# Patient Record
Sex: Female | Born: 1950
Health system: Southern US, Community
[De-identification: ages and names within clinical notes are randomized; demographics above are authoritative.]

## PROBLEM LIST (undated history)

## (undated) DIAGNOSIS — E785 Hyperlipidemia, unspecified: Secondary | ICD-10-CM

## (undated) DIAGNOSIS — I209 Angina pectoris, unspecified: Secondary | ICD-10-CM

## (undated) DIAGNOSIS — M1990 Unspecified osteoarthritis, unspecified site: Secondary | ICD-10-CM

## (undated) DIAGNOSIS — H3320 Serous retinal detachment, unspecified eye: Secondary | ICD-10-CM

## (undated) DIAGNOSIS — R0602 Shortness of breath: Secondary | ICD-10-CM

## (undated) DIAGNOSIS — F1910 Other psychoactive substance abuse, uncomplicated: Secondary | ICD-10-CM

## (undated) DIAGNOSIS — N189 Chronic kidney disease, unspecified: Secondary | ICD-10-CM

## (undated) DIAGNOSIS — K226 Gastro-esophageal laceration-hemorrhage syndrome: Secondary | ICD-10-CM

## (undated) DIAGNOSIS — M62838 Other muscle spasm: Secondary | ICD-10-CM

## (undated) DIAGNOSIS — K5792 Diverticulitis of intestine, part unspecified, without perforation or abscess without bleeding: Secondary | ICD-10-CM

## (undated) DIAGNOSIS — H269 Unspecified cataract: Secondary | ICD-10-CM

## (undated) DIAGNOSIS — I10 Essential (primary) hypertension: Secondary | ICD-10-CM

## (undated) DIAGNOSIS — K625 Hemorrhage of anus and rectum: Secondary | ICD-10-CM

## (undated) HISTORY — DX: Other muscle spasm: M62.838

## (undated) HISTORY — PX: UPPER GASTROINTESTINAL ENDOSCOPY: SHX188

## (undated) HISTORY — PX: OTHER SURGICAL HISTORY: SHX169

## (undated) HISTORY — PX: TUBAL LIGATION: SHX77

## (undated) HISTORY — DX: Unspecified cataract: H26.9

## (undated) HISTORY — PX: APPENDECTOMY: SHX54

## (undated) HISTORY — PX: DIAGNOSTIC LAPAROSCOPY: SUR761

---

## 1999-08-29 ENCOUNTER — Ambulatory Visit (HOSPITAL_COMMUNITY): Admission: RE | Admit: 1999-08-29 | Discharge: 1999-08-29 | Payer: Self-pay | Admitting: Family Medicine

## 1999-08-29 ENCOUNTER — Encounter: Payer: Self-pay | Admitting: Family Medicine

## 2000-04-29 ENCOUNTER — Ambulatory Visit (HOSPITAL_COMMUNITY): Admission: RE | Admit: 2000-04-29 | Discharge: 2000-04-29 | Payer: Self-pay | Admitting: Internal Medicine

## 2000-04-29 ENCOUNTER — Encounter: Payer: Self-pay | Admitting: Internal Medicine

## 2000-05-04 ENCOUNTER — Ambulatory Visit (HOSPITAL_COMMUNITY): Admission: RE | Admit: 2000-05-04 | Discharge: 2000-05-04 | Payer: Self-pay | Admitting: Internal Medicine

## 2000-05-04 ENCOUNTER — Encounter: Payer: Self-pay | Admitting: Internal Medicine

## 2000-06-03 ENCOUNTER — Encounter (INDEPENDENT_AMBULATORY_CARE_PROVIDER_SITE_OTHER): Payer: Self-pay | Admitting: Specialist

## 2000-06-03 ENCOUNTER — Ambulatory Visit (HOSPITAL_COMMUNITY): Admission: RE | Admit: 2000-06-03 | Discharge: 2000-06-04 | Payer: Self-pay | Admitting: Neurosurgery

## 2000-10-15 ENCOUNTER — Ambulatory Visit (HOSPITAL_COMMUNITY): Admission: RE | Admit: 2000-10-15 | Discharge: 2000-10-15 | Payer: Self-pay | Admitting: *Deleted

## 2000-10-19 ENCOUNTER — Ambulatory Visit (HOSPITAL_COMMUNITY): Admission: RE | Admit: 2000-10-19 | Discharge: 2000-10-19 | Payer: Self-pay | Admitting: *Deleted

## 2000-11-06 ENCOUNTER — Other Ambulatory Visit: Admission: RE | Admit: 2000-11-06 | Discharge: 2000-11-06 | Payer: Self-pay | Admitting: Family Medicine

## 2000-12-16 ENCOUNTER — Emergency Department (HOSPITAL_COMMUNITY): Admission: EM | Admit: 2000-12-16 | Discharge: 2000-12-16 | Payer: Self-pay | Admitting: Emergency Medicine

## 2001-01-10 ENCOUNTER — Emergency Department (HOSPITAL_COMMUNITY): Admission: EM | Admit: 2001-01-10 | Discharge: 2001-01-10 | Payer: Self-pay

## 2001-06-23 ENCOUNTER — Emergency Department (HOSPITAL_COMMUNITY): Admission: EM | Admit: 2001-06-23 | Discharge: 2001-06-23 | Payer: Self-pay | Admitting: Emergency Medicine

## 2001-06-23 ENCOUNTER — Encounter: Payer: Self-pay | Admitting: Emergency Medicine

## 2002-04-21 ENCOUNTER — Emergency Department (HOSPITAL_COMMUNITY): Admission: EM | Admit: 2002-04-21 | Discharge: 2002-04-21 | Payer: Self-pay | Admitting: *Deleted

## 2002-04-22 ENCOUNTER — Encounter: Payer: Self-pay | Admitting: Emergency Medicine

## 2002-04-24 ENCOUNTER — Ambulatory Visit (HOSPITAL_COMMUNITY): Admission: RE | Admit: 2002-04-24 | Discharge: 2002-04-25 | Payer: Self-pay | Admitting: *Deleted

## 2002-04-24 ENCOUNTER — Encounter: Payer: Self-pay | Admitting: *Deleted

## 2002-04-24 ENCOUNTER — Encounter: Payer: Self-pay | Admitting: Neurosurgery

## 2003-05-04 ENCOUNTER — Ambulatory Visit (HOSPITAL_COMMUNITY): Admission: RE | Admit: 2003-05-04 | Discharge: 2003-05-04 | Payer: Self-pay | Admitting: Internal Medicine

## 2003-05-04 ENCOUNTER — Encounter: Payer: Self-pay | Admitting: Internal Medicine

## 2003-06-21 ENCOUNTER — Encounter (HOSPITAL_COMMUNITY): Admission: RE | Admit: 2003-06-21 | Discharge: 2003-09-19 | Payer: Self-pay | Admitting: General Surgery

## 2003-06-22 ENCOUNTER — Encounter: Payer: Self-pay | Admitting: General Surgery

## 2003-08-22 ENCOUNTER — Encounter: Payer: Self-pay | Admitting: General Surgery

## 2004-09-22 ENCOUNTER — Emergency Department (HOSPITAL_COMMUNITY): Admission: EM | Admit: 2004-09-22 | Discharge: 2004-09-22 | Payer: Self-pay | Admitting: Emergency Medicine

## 2004-10-14 ENCOUNTER — Emergency Department (HOSPITAL_COMMUNITY): Admission: EM | Admit: 2004-10-14 | Discharge: 2004-10-14 | Payer: Self-pay | Admitting: Emergency Medicine

## 2007-01-01 ENCOUNTER — Ambulatory Visit: Payer: Self-pay | Admitting: Family Medicine

## 2007-01-04 ENCOUNTER — Ambulatory Visit (HOSPITAL_COMMUNITY): Admission: RE | Admit: 2007-01-04 | Discharge: 2007-01-04 | Payer: Self-pay | Admitting: Family Medicine

## 2007-01-04 ENCOUNTER — Ambulatory Visit: Payer: Self-pay | Admitting: Internal Medicine

## 2007-01-04 ENCOUNTER — Ambulatory Visit: Payer: Self-pay | Admitting: *Deleted

## 2007-01-05 ENCOUNTER — Ambulatory Visit: Payer: Self-pay | Admitting: Family Medicine

## 2007-02-05 ENCOUNTER — Ambulatory Visit: Payer: Self-pay | Admitting: Family Medicine

## 2007-02-19 ENCOUNTER — Ambulatory Visit (HOSPITAL_COMMUNITY): Admission: RE | Admit: 2007-02-19 | Discharge: 2007-02-19 | Payer: Self-pay | Admitting: Family Medicine

## 2007-02-27 ENCOUNTER — Inpatient Hospital Stay (HOSPITAL_COMMUNITY): Admission: EM | Admit: 2007-02-27 | Discharge: 2007-03-02 | Payer: Self-pay | Admitting: Emergency Medicine

## 2007-09-15 ENCOUNTER — Encounter (INDEPENDENT_AMBULATORY_CARE_PROVIDER_SITE_OTHER): Payer: Self-pay | Admitting: *Deleted

## 2008-03-08 ENCOUNTER — Ambulatory Visit: Payer: Self-pay | Admitting: Internal Medicine

## 2008-03-08 ENCOUNTER — Encounter (INDEPENDENT_AMBULATORY_CARE_PROVIDER_SITE_OTHER): Payer: Self-pay | Admitting: Family Medicine

## 2008-03-08 LAB — CONVERTED CEMR LAB
AST: 17 units/L (ref 0–37)
Albumin: 4.5 g/dL (ref 3.5–5.2)
Chloride: 106 meq/L (ref 96–112)
Creatinine, Ser: 1.25 mg/dL — ABNORMAL HIGH (ref 0.40–1.20)
Eosinophils Relative: 2 % (ref 0–5)
Glucose, Bld: 95 mg/dL (ref 70–99)
HCT: 48.9 % — ABNORMAL HIGH (ref 36.0–46.0)
Hemoglobin: 15.2 g/dL — ABNORMAL HIGH (ref 12.0–15.0)
LDL Cholesterol: 104 mg/dL — ABNORMAL HIGH (ref 0–99)
Lymphocytes Relative: 31 % (ref 12–46)
Lymphs Abs: 3.2 10*3/uL (ref 0.7–4.0)
Monocytes Absolute: 0.4 10*3/uL (ref 0.1–1.0)
Monocytes Relative: 4 % (ref 3–12)
Sodium: 146 meq/L — ABNORMAL HIGH (ref 135–145)
Total Protein: 7.4 g/dL (ref 6.0–8.3)
Triglycerides: 129 mg/dL (ref <150)
WBC: 10.2 10*3/uL (ref 4.0–10.5)

## 2008-03-27 ENCOUNTER — Ambulatory Visit: Payer: Self-pay | Admitting: Internal Medicine

## 2008-03-27 ENCOUNTER — Encounter (INDEPENDENT_AMBULATORY_CARE_PROVIDER_SITE_OTHER): Payer: Self-pay | Admitting: Family Medicine

## 2008-03-31 ENCOUNTER — Ambulatory Visit (HOSPITAL_COMMUNITY): Admission: RE | Admit: 2008-03-31 | Discharge: 2008-03-31 | Payer: Self-pay | Admitting: Family Medicine

## 2008-04-24 ENCOUNTER — Ambulatory Visit: Payer: Self-pay | Admitting: Internal Medicine

## 2008-04-24 ENCOUNTER — Encounter (INDEPENDENT_AMBULATORY_CARE_PROVIDER_SITE_OTHER): Payer: Self-pay | Admitting: Family Medicine

## 2008-04-24 LAB — CONVERTED CEMR LAB
AST: 14 units/L (ref 0–37)
BUN: 24 mg/dL — ABNORMAL HIGH (ref 6–23)
CO2: 22 meq/L (ref 19–32)
Calcium: 8.6 mg/dL (ref 8.4–10.5)
Chloride: 106 meq/L (ref 96–112)
Glucose, Bld: 112 mg/dL — ABNORMAL HIGH (ref 70–99)
Potassium: 4.3 meq/L (ref 3.5–5.3)

## 2008-06-06 ENCOUNTER — Ambulatory Visit: Payer: Self-pay | Admitting: Internal Medicine

## 2008-06-06 ENCOUNTER — Encounter (INDEPENDENT_AMBULATORY_CARE_PROVIDER_SITE_OTHER): Payer: Self-pay | Admitting: Family Medicine

## 2008-06-06 LAB — CONVERTED CEMR LAB
ALT: 14 units/L (ref 0–35)
AST: 14 units/L (ref 0–37)
Albumin: 4.6 g/dL (ref 3.5–5.2)
Alkaline Phosphatase: 107 units/L (ref 39–117)
CO2: 23 meq/L (ref 19–32)
Glucose, Bld: 74 mg/dL (ref 70–99)
Total Protein: 7.3 g/dL (ref 6.0–8.3)

## 2008-10-25 ENCOUNTER — Encounter (INDEPENDENT_AMBULATORY_CARE_PROVIDER_SITE_OTHER): Payer: Self-pay | Admitting: Family Medicine

## 2008-10-25 ENCOUNTER — Ambulatory Visit: Payer: Self-pay | Admitting: Internal Medicine

## 2008-10-25 LAB — CONVERTED CEMR LAB
AST: 16 units/L (ref 0–37)
Alkaline Phosphatase: 120 units/L — ABNORMAL HIGH (ref 39–117)
Basophils Absolute: 0 10*3/uL (ref 0.0–0.1)
Basophils Relative: 0 % (ref 0–1)
CO2: 22 meq/L (ref 19–32)
Calcium: 9 mg/dL (ref 8.4–10.5)
Chloride: 109 meq/L (ref 96–112)
Creatinine, Ser: 1.34 mg/dL — ABNORMAL HIGH (ref 0.40–1.20)
HCT: 47.9 % — ABNORMAL HIGH (ref 36.0–46.0)
Hemoglobin: 15 g/dL (ref 12.0–15.0)
MCHC: 31.3 g/dL (ref 30.0–36.0)
Neutrophils Relative %: 75 % (ref 43–77)
Platelets: 368 10*3/uL (ref 150–400)
Potassium: 4.8 meq/L (ref 3.5–5.3)
RDW: 15.3 % (ref 11.5–15.5)
Sodium: 143 meq/L (ref 135–145)
Total Bilirubin: 0.5 mg/dL (ref 0.3–1.2)

## 2008-11-10 ENCOUNTER — Ambulatory Visit (HOSPITAL_COMMUNITY): Admission: RE | Admit: 2008-11-10 | Discharge: 2008-11-10 | Payer: Self-pay | Admitting: Internal Medicine

## 2009-05-02 ENCOUNTER — Ambulatory Visit (HOSPITAL_COMMUNITY): Admission: RE | Admit: 2009-05-02 | Discharge: 2009-05-02 | Payer: Self-pay | Admitting: Family Medicine

## 2009-05-11 ENCOUNTER — Ambulatory Visit: Payer: Self-pay | Admitting: Internal Medicine

## 2009-06-29 ENCOUNTER — Ambulatory Visit: Payer: Self-pay | Admitting: Internal Medicine

## 2009-06-29 ENCOUNTER — Encounter (INDEPENDENT_AMBULATORY_CARE_PROVIDER_SITE_OTHER): Payer: Self-pay | Admitting: Adult Health

## 2009-12-11 ENCOUNTER — Ambulatory Visit: Payer: Self-pay | Admitting: Internal Medicine

## 2009-12-13 ENCOUNTER — Ambulatory Visit: Payer: Self-pay | Admitting: Internal Medicine

## 2010-02-22 ENCOUNTER — Ambulatory Visit: Payer: Self-pay | Admitting: Internal Medicine

## 2010-02-22 ENCOUNTER — Encounter (INDEPENDENT_AMBULATORY_CARE_PROVIDER_SITE_OTHER): Payer: Self-pay | Admitting: Adult Health

## 2010-02-22 LAB — CONVERTED CEMR LAB
ALT: 8 units/L (ref 0–35)
AST: 12 units/L (ref 0–37)
BUN: 16 mg/dL (ref 6–23)
Basophils Relative: 0 % (ref 0–1)
Chloride: 109 meq/L (ref 96–112)
Creatinine, Ser: 1.33 mg/dL — ABNORMAL HIGH (ref 0.40–1.20)
Eosinophils Absolute: 0.2 10*3/uL (ref 0.0–0.7)
Hemoglobin: 13.8 g/dL (ref 12.0–15.0)
Lymphocytes Relative: 16 % (ref 12–46)
MCV: 84.1 fL (ref 78.0–100.0)
Monocytes Absolute: 0.4 10*3/uL (ref 0.1–1.0)
Neutro Abs: 9.3 10*3/uL — ABNORMAL HIGH (ref 1.7–7.7)
Neutrophils Relative %: 79 % — ABNORMAL HIGH (ref 43–77)
Platelets: 326 10*3/uL (ref 150–400)
Potassium: 4.5 meq/L (ref 3.5–5.3)
RDW: 15.2 % (ref 11.5–15.5)
Sodium: 144 meq/L (ref 135–145)
TSH: 0.686 microintl units/mL (ref 0.350–4.500)
Total CHOL/HDL Ratio: 2.3
Triglycerides: 97 mg/dL (ref <150)
Vit D, 25-Hydroxy: 20 ng/mL — ABNORMAL LOW (ref 30–89)

## 2010-03-05 ENCOUNTER — Encounter: Admission: RE | Admit: 2010-03-05 | Discharge: 2010-03-05 | Payer: Self-pay | Admitting: Internal Medicine

## 2010-03-15 ENCOUNTER — Ambulatory Visit: Payer: Self-pay | Admitting: Internal Medicine

## 2010-03-19 ENCOUNTER — Ambulatory Visit (HOSPITAL_COMMUNITY): Admission: RE | Admit: 2010-03-19 | Discharge: 2010-03-19 | Payer: Self-pay | Admitting: Adult Health

## 2010-03-29 ENCOUNTER — Ambulatory Visit: Payer: Self-pay | Admitting: Internal Medicine

## 2010-05-17 ENCOUNTER — Ambulatory Visit (HOSPITAL_COMMUNITY): Admission: RE | Admit: 2010-05-17 | Discharge: 2010-05-17 | Payer: Self-pay | Admitting: Cardiology

## 2010-06-20 ENCOUNTER — Ambulatory Visit (HOSPITAL_COMMUNITY): Admission: RE | Admit: 2010-06-20 | Discharge: 2010-06-20 | Payer: Self-pay | Admitting: Cardiology

## 2010-09-09 ENCOUNTER — Ambulatory Visit: Payer: Self-pay | Admitting: Internal Medicine

## 2011-05-16 NOTE — H&P (Signed)
NAME:  Jennifer Moss, Jennifer Moss                ACCOUNT NO.:  [id number]   MEDICAL RECORD NO.:  [id number]          PATIENT TYPE:  INP   LOCATION:  0102                         FACILITY:  [facility]   PHYSICIAN:  Hind I Elsaid, MD      DATE OF BIRTH:  06-17-1951   DATE OF ADMISSION:  02/27/2007  DATE OF DISCHARGE:                              HISTORY & PHYSICAL   PRIMARY CARE PHYSICIAN:  Unassigned.  Patient has no primary care  physician.  She had a Careers adviser, whose name is Sherilyn Cooter.   CHIEF COMPLAINT:  Severe low back pain.   HISTORY OF PRESENT ILLNESS:  A 60 year old African-American female with  a history of neurofibromatosis, type I, according to the record, but the  patient denies any history like that with a history of left sciatic  nerve sheath tumor, is status post excision of the nerve sheath tumor in  2001.  Presented with a chief complaint of lower back pain.  Patient was  in good health all day today.  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  Patient then suddenly  developed severe back pain radiating to both lower extremities.  The  pain was a 10/10.  It is sharp pain.  Not associated with numbness or  weakness.  The pain off and on, then suddenly, the pain becomes worse in  severity.  The patient is not even able to walk.  Patient denies any  numbness or weakness in her lower extremities.  Denies any incontinence  of urine or bowel.  Denies any other sign of pain.  Denies any abdominal  pain.  Denies any chest pain.  The patient, when presented to the  emergency room, the pain was so severe, but she was able to move her  extremities.  She received some pain medication with some improvement on  the pain, but the pain continued to be in the lumbosacral area.  The  patient denies any headache.  Denies any facial weakness or drooping of  saliva.  The patient denies any similar history like that in the past.   PAST MEDICAL HISTORY:  1.  Neurofibromatosis, type 1.  2. Sciatic nerve tumor, status post excision in 2001, which showed      neurofibromatosis.   PAST SURGICAL HISTORY:  1. As I mentioned, a sciatic nerve tumor, status post excision on the      left side.  2. Some abdominal surgery when she was a child.   ALLERGIES:  No known drug allergies.   MEDICATIONS:  Not known. She is not on any medication.   FAMILY HISTORY:  Positive for heart attack.  Her father died with heart  attack.  Her mother is still alive.  She had emphysema and seizure  disorder.   REVIEW OF SYSTEMS:  Denies any neck stiffness.  Denies any abdominal  pain.  Positive for nausea.  Positive for two episodes of vomiting,  mainly food particles.  Mainly due to the pain.  No burning micturition.  No change in bowel habits.  No numbness or weakness, as I mentioned, of  the extremities.  The patient seen in the emergency room.   A laboratory workup was done, CAT scan of the lumbar spine, mild  degenerative change in the lower lumbar spine.  Does not show any  __________.   PHYSICAL EXAMINATION:  VITAL SIGNS:  Temperature 98.1, blood pressure  151/73, heart rate 84, respiratory rate 22.  Oxygen saturation 98% on  room air.  GENERAL:  A pleasant African-American female in mild distress because of  pain.  HEENT:  Normocephalic and atraumatic.  Pupils are equal, round and  reactive to light and accommodation.  There is some cafe au lait spots  on her face.  There is some nevi all over her face.  NECK:  No JVD.  No thyromegaly.  CHEST:  Normal fascicular breathing with equal air entry and symmetrical  breast examination.  No lymphadenopathy.  No obvious masses.  HEART:  S1 and S2 with no added sounds.  ABDOMEN:  Soft and nontender.  Bowel sounds positive.  EXTREMITIES:  Examination of the lower extremities, no lower limb edema,  and no peripheral pulses.  CNS:  Cranial nerves II-XII are intact.  Patient can move all  extremities, 5/5 on the  lower extremities, pronator and supinator within  normal limits.  Babinski's sign within normal limits.  RECTAL:  Rectal tone within normal.  Sensation was intact.  BACK:  Examination of the back, there is tenderness on palpation, at the  lumbosacral area, mainly at the left and right pelvic area.   ASSESSMENT/PLAN:  1. Severe lower back pain without neurological deficit.  Patient has a      history of neurofibromatosis.  Differential diagnosis could be a      recurrence of sheath tumor or it could be herniated disk, although      unlikely because of both lower extremities are involved.  There is      no neurologic finding at this time.  With all other MRI of the      lumbosacral spine with the pelvis, will get x-ray of the      lumbosacral spine with the pelvis.  Will continue with pain      medication at this time.  Will consider calling the patient's      surgeon if there is any recurrence of the tumor.  No need for      starting on a steroid at this time, as there is no evidence of any      neurological finding to include spinal cord compression on the      differential diagnosis.  2. Vomiting secondary to #1:  Supportive measures.  Abdomen is soft.      No evidence of any acute abdomen.  3. Deep venous thrombosis/gastrointestinal prophylaxis.      Hind Bosie Helper, MD  Electronically Signed     HIE/MEDQ  D:  02/27/2007  T:  02/27/2007  Job:  [id number]

## 2011-05-16 NOTE — Discharge Summary (Signed)
NAMEWILLOW, [name]:  [id number]   MEDICAL RECORD NO.:  [id number]          PATIENT TYPE:  INP   LOCATION:  1606                         FACILITY:  [facility]   PHYSICIAN:  Hettie Holstein, D.O.    DATE OF BIRTH:  1951/02/02   DATE OF PROCEDURE:  DATE OF DISCHARGE:  03/02/2007                    STAT - MUST CHANGE TO CORRECT WORK TYPE   ADDENDUM  Addendum to account [id number]   I contacted [facility], spoke with Fannie Knee Drinker's nurse, Mora Bellman, of the  need to contact [name] for followup of these MRI results.      Hettie Holstein, D.O.  Electronically Signed     ESS/MEDQ  D:  04/08/2007  T:  04/08/2007  Job:  [id number]

## 2011-05-16 NOTE — Op Note (Signed)
[place]. [facility]  Patient:    Jennifer Moss, Jennifer Moss                       MRN: [id number] Proc. Date: 06/03/00 Adm. Date:  [id number] Disc. Date: [id number] Attending:  Donn Pierini                           Operative Report  ATTENDING PHYSICIAN:  Julio Sicks, M.D.  SERVICE:  Neurosurgery.  PREOPERATIVE DIAGNOSIS:  Left, sciatic nerve sheath tumor.  POSTOPERATIVE DIAGNOSIS:  Left, sciatic nerve sheath tumor.  PROCEDURE:  Exploration of the left, sciatic nerve with excision of nerve sheath tumor utilizing microdissection.  SURGEON:  Julio Sicks, M.D.  ASSISTANT:  Donzetta Sprung. Roney Jaffe., M.D.  ANESTHESIA:  General endotracheal.  INDICATIONS:  [name] is a 60 year old, black female with a history of neurofibromatosis type I who presents with a four month history of progressive left hip and left lower extremity pain.  The patient reports that whenever she sits down, she has pain shooting out her left lower extremity.  Imaging studies have revealed a left-sided, sciatic nerve sheath tumor.  This is consistent with a neurofibroma.  The patient has been counseled as to her options.  She has decided to proceed with excision of the nerve sheath tumor. She realizes that the risks of the surgery include new weakness or sensory loss as a result of the excision of the lesion.  DESCRIPTION OF PROCEDURE:  The patient was taken to the operating room and placed on the operating table in the supine position.  After an adequate level of anesthesia was achieved, the patient was turned prone onto ______ and appropriately padded.  The patients left gluteal and posterior thigh regions were then prepped and draped.  A #10 blade was used to make a curvilinear incision over the left buttocks and extending into the proximal posterior, left thigh.  This was carried down sharply through the superficial fascia and fat to the gluteus maximus fascia.  The gluteus maximus was then  divided along the muscle fibers and then resected superiorly to distally.  The sciatic trunk was then dissected free.  The hamstring muscles were mobilized and retracted towards the left.  The nerve sheath tumor was readily apparent.  The microscope was brought into the field and used for microdissection of the nerve sheath tumor and the sciatic nerve.  Using microdissection, the nerve sheath tumor was slowly and gradually peeled away from the normal fibrils of the sciatic nerve.  The nerve sheath tumor was then completely isolated and dissected free using microscissors.  The specimen was sent to pathology. Prior to sectioning the tumor, the nerve fibers leading into the tumor were stimulated.  There was found to be no evidence of active muscular contraction. The wound was then copiously irrigated with antibiotic solution.  The sciatic nerve was completely within continuity.  The wound was then closed in layers of Vicryl sutures.  Steri-Strips were applied to the surface.  There were no apparent complications.  The patient tolerated the procedure well, and she returned to the recovery room postoperatively. DD:  06/03/00 TD:  06/07/00 Job: [id number]

## 2011-05-16 NOTE — Discharge Summary (Signed)
NAMESHAELIN, Jennifer [name]:  [id number]   MEDICAL RECORD NO.:  [id number]          PATIENT TYPE:  INP   LOCATION:  1606                         FACILITY:  [facility]   PHYSICIAN:  Hettie Holstein, D.O.    DATE OF BIRTH:  06/12/1951   DATE OF ADMISSION:  02/27/2007  DATE OF DISCHARGE:  03/02/2007                               DISCHARGE SUMMARY   PRIMARY CARE PHYSICIAN:  [facility].   FINAL DIAGNOSES:  1. Low back pain with a history of neurofibromatosis and history of      left sciatic nerve sheath tumor followed by Dr. Julio Sicks and      diabetes.  Status post inpatient evaluation including an MRI of her      L-spine which revealed mild degenerative changes without      significant spondylostenosis or nerve root compression.  2. Decreased signal intensity of bone marrow.  3. 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  4. Subcentimeter liver lesions noted but incompletely evaluated.   These findings are noted on the full radiographic report. Post discharge  I will cc a copy of this to [facility] so that these can be followed  up upon.   MEDICATIONS ON DISCHARGE:  1. Motrin 600 mg t.i.d.  2. OxyContin 5 mg 1-2 every 6h. as needed for pain.  3. Robaxin 1 gram q.i.d. dispense #20.  Refill times 1.  4. Milk of Magnesia 30 mL 1-2 times per day with recommendations to      hold for diarrhea.   She is instructed to remain off of work for 1-2 weeks, depending on  progress, and continue with outpatient physical therapy.   DISPOSITION:  Copy sent to [facility] so that Jennifer Moss can be  contacted to address and follow up with the imaging findings as  described above with reference to her liver lesions that were  incompletely evaluated.   HISTORY OF PRESENTING ILLNESS:  For full details, please refer to the  H&P as dictated by Dr. Ebony Cargo.  However, briefly, Jennifer Moss is a 60-  year-old female with a history of  neurofibromatosis type 1 and a history  of nerve sheath tumor noted in 2001.  She presented with a chief  complaint of low back pain.  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  She suddenly  developed severe back pain radiating to the lower extremities.  This was  not associated with numbness or weakness.  At any event, she presented  to the emergency room being in severe pain.  She denied any other  neurologic symptoms.   HOSPITAL COURSE:  She was admitted for pain management and symptomatic  management.  She did have some nausea and vomiting.  These subsequently  did resolve.  She underwent MRI as described above.  The preliminary  reports revealed no acute nerve impingement findings.  There were some  incidental findings that should be followed up by the patient's primary  physician.  I will cc a copy of this to [facility] and contact them as  well to assure there is ample followup of these lesions.   Her laboratory data revealed her sodium to be 142, potassium 3.7, BUN  13, creatinine 1.36 and glucose of 96.  WBC of 11.3, hemoglobin 14,  platelet count of 339.      Hettie Holstein, D.O.  Electronically Signed     ESS/MEDQ  D:  04/08/2007  T:  04/08/2007  Job:  [id number]   cc:   [facility]

## 2011-05-23 ENCOUNTER — Emergency Department (HOSPITAL_COMMUNITY)
Admission: EM | Admit: 2011-05-23 | Discharge: 2011-05-23 | Disposition: A | Discharge status: Home / Self Care | Payer: Self-pay | Attending: Emergency Medicine | Admitting: Emergency Medicine

## 2011-05-23 DIAGNOSIS — K089 Disorder of teeth and supporting structures, unspecified: Secondary | ICD-10-CM | POA: Insufficient documentation

## 2011-05-23 DIAGNOSIS — K029 Dental caries, unspecified: Secondary | ICD-10-CM | POA: Insufficient documentation

## 2011-05-23 DIAGNOSIS — I10 Essential (primary) hypertension: Secondary | ICD-10-CM | POA: Insufficient documentation

## 2011-12-30 HISTORY — PX: COLONOSCOPY: SHX174

## 2012-07-09 ENCOUNTER — Other Ambulatory Visit (HOSPITAL_COMMUNITY): Payer: Self-pay | Admitting: Family Medicine

## 2012-07-09 DIAGNOSIS — Z1231 Encounter for screening mammogram for malignant neoplasm of breast: Secondary | ICD-10-CM

## 2012-07-29 ENCOUNTER — Ambulatory Visit (HOSPITAL_COMMUNITY)
Admission: RE | Admit: 2012-07-29 | Discharge: 2012-07-29 | Disposition: A | Discharge status: Home / Self Care | Payer: Self-pay | Source: Ambulatory Visit | Attending: Family Medicine | Admitting: Family Medicine

## 2012-07-29 DIAGNOSIS — Z1231 Encounter for screening mammogram for malignant neoplasm of breast: Secondary | ICD-10-CM | POA: Insufficient documentation

## 2012-11-05 ENCOUNTER — Emergency Department (HOSPITAL_COMMUNITY): Payer: Self-pay

## 2012-11-05 ENCOUNTER — Emergency Department (HOSPITAL_COMMUNITY)
Admission: EM | Admit: 2012-11-05 | Discharge: 2012-11-05 | Disposition: A | Discharge status: Home / Self Care | Payer: Self-pay | Attending: Emergency Medicine | Admitting: Emergency Medicine

## 2012-11-05 ENCOUNTER — Encounter (HOSPITAL_COMMUNITY): Payer: Self-pay | Admitting: Emergency Medicine

## 2012-11-05 DIAGNOSIS — M940 Chondrocostal junction syndrome [Tietze]: Secondary | ICD-10-CM | POA: Insufficient documentation

## 2012-11-05 DIAGNOSIS — I10 Essential (primary) hypertension: Secondary | ICD-10-CM | POA: Insufficient documentation

## 2012-11-05 HISTORY — DX: Essential (primary) hypertension: I10

## 2012-11-05 LAB — CBC WITH DIFFERENTIAL/PLATELET
Basophils Absolute: 0 10*3/uL (ref 0.0–0.1)
Basophils Relative: 0 % (ref 0–1)
Eosinophils Relative: 1 % (ref 0–5)
HCT: 43.7 % (ref 36.0–46.0)
MCHC: 33.9 g/dL (ref 30.0–36.0)
Monocytes Absolute: 0.4 10*3/uL (ref 0.1–1.0)
Neutro Abs: 5.3 10*3/uL (ref 1.7–7.7)
Platelets: 344 10*3/uL (ref 150–400)
RDW: 13.9 % (ref 11.5–15.5)

## 2012-11-05 LAB — COMPREHENSIVE METABOLIC PANEL
AST: 24 U/L (ref 0–37)
Albumin: 3.9 g/dL (ref 3.5–5.2)
Calcium: 9.6 mg/dL (ref 8.4–10.5)
Chloride: 101 mEq/L (ref 96–112)
Creatinine, Ser: 1.53 mg/dL — ABNORMAL HIGH (ref 0.50–1.10)

## 2012-11-05 LAB — RAPID URINE DRUG SCREEN, HOSP PERFORMED
Amphetamines: NOT DETECTED
Cocaine: POSITIVE — AB
Opiates: NOT DETECTED
Tetrahydrocannabinol: POSITIVE — AB

## 2012-11-05 MED ORDER — ASPIRIN 81 MG PO CHEW
324.0000 mg | CHEWABLE_TABLET | Freq: Once | ORAL | Status: AC
  Administered 2012-11-05: 324 mg via ORAL
  Filled 2012-11-05: qty 4

## 2012-11-05 MED ORDER — KETOROLAC TROMETHAMINE 60 MG/2ML IM SOLN: 30.0000 mg | Freq: Once | INTRAMUSCULAR | Status: DC

## 2012-11-05 MED ORDER — LORAZEPAM 1 MG PO TABS
1.0000 mg | ORAL_TABLET | Freq: Once | ORAL | Status: AC
  Administered 2012-11-05: 1 mg via ORAL
  Filled 2012-11-05: qty 1

## 2012-11-05 MED ORDER — IBUPROFEN 200 MG PO TABS: 600.0000 mg | ORAL_TABLET | Freq: Four times a day (QID) | ORAL | Status: DC | PRN

## 2012-11-05 MED ORDER — KETOROLAC TROMETHAMINE 30 MG/ML IJ SOLN
30.0000 mg | Freq: Once | INTRAMUSCULAR | Status: AC
  Administered 2012-11-05: 30 mg via INTRAVENOUS
  Filled 2012-11-05: qty 1

## 2012-11-05 NOTE — ED Notes (Signed)
Came into Patient room earlier, Patient was out of room in Xray

## 2012-11-05 NOTE — ED Provider Notes (Signed)
History     CSN: [id number]  Arrival date & time 11/05/12  4098   First MD Initiated Contact with Patient 11/05/12 [phone]      Chief Complaint  Patient presents with  . Chest Pain    (Consider location/radiation/quality/duration/timing/severity/associated sxs/prior treatment) HPI Patient complains of dull, "sore", 6/10 left-sided chest pain that radiates to the L scapula and L arm. The pain began 36 hrs prior to presentation while at rest. She states that the pain has been constant since that time. She denies any SOB, diaphoresis, palpitations. Denies any alleviating or exacerbating factors such as change in position or change in pain with respirations. She states she has taken only one tylenol for the pain, which did not seem to help. Denies similar pain in the past. 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  Past Medical History  Diagnosis Date  . Hypertension     History reviewed. No pertinent past surgical history.  History reviewed. No pertinent family history.  History  Substance Use Topics  . Smoking status: Never Smoker   . Smokeless tobacco: Not on file  . Alcohol Use: Yes     Comment: occ    OB History    Grav Para Term Preterm Abortions TAB SAB Ect Mult Living                  Review of Systems  Constitutional: Negative for fever, chills and diaphoresis.  Respiratory: Negative for cough, chest tightness and shortness of breath.   Cardiovascular: Positive for chest pain. Negative for palpitations and leg swelling.  Gastrointestinal: Negative for nausea, vomiting, abdominal pain and diarrhea.  Genitourinary: Negative.   Musculoskeletal: Negative.   Skin: Negative.   Neurological: Negative for light-headedness.  Hematological: Negative.   Psychiatric/Behavioral: Negative.     Allergies  Review of patient's allergies indicates no known allergies.  Home Medications  No current outpatient prescriptions on  file.  BP 120/90  Pulse 64  Temp 97.8 F (36.6 C) (Oral)  Resp 20  SpO2 100%  Physical Exam  Constitutional: She is oriented to person, place, and time. She appears well-developed and well-nourished. No distress.  HENT:  Head: Normocephalic and atraumatic.  Eyes: Conjunctivae normal are normal. Pupils are equal, round, and reactive to light. No scleral icterus.  Neck: Normal range of motion.  Cardiovascular: Normal rate and regular rhythm.   No murmur heard. Pulmonary/Chest: Effort normal. She has no wheezes. She has no rales. She exhibits tenderness (tender to palpation of L chest, reproducing identical pain to that of chief complaint).  Abdominal: Soft. She exhibits no distension. There is no tenderness.  Musculoskeletal: Normal range of motion. She exhibits no edema.  Neurological: She is alert and oriented to person, place, and time. No cranial nerve deficit.  Skin: Skin is warm and dry. No rash noted.  Psychiatric: She has a normal mood and affect. Her behavior is normal.    ED Course  Procedures (including critical care time)   Labs Reviewed  CBC WITH DIFFERENTIAL  COMPREHENSIVE METABOLIC PANEL  TROPONIN I   No results found.   1. Costochondritis       MDM   Date: 11/05/2012  Rate: 66  Rhythm: normal sinus rhythm  QRS Axis: normal  Intervals: normal  ST/T Wave abnormalities: normal  Conduction Disutrbances:none  Narrative Interpretation:   Old EKG Reviewed: unchanged  Patient given toradol, states this relieved the pain somewhat. Will be discharged on ibuprofen for costochondritis.  Elfredia Nevins, MD 11/05/12 [phone]

## 2012-11-05 NOTE — ED Provider Notes (Signed)
I saw and evaluated the patient, reviewed the resident's note and I agree with the findings and plan.  Reproducible L sided chest pain that has been constant x 2 days.  No SOB, nausea, palpitations. EKG nonischemic, troponin negative, cocaine positive.  Delta troponin negative. Pain reproducible.  Glynn Octave, MD 11/05/12 [phone]

## 2012-11-05 NOTE — ED Notes (Signed)
Pt c/o midsternal to left sided CP with radiation down both arms and in ribs x 3 days

## 2012-12-15 ENCOUNTER — Encounter (HOSPITAL_COMMUNITY): Payer: Self-pay | Admitting: Physical Medicine and Rehabilitation

## 2012-12-15 ENCOUNTER — Emergency Department (HOSPITAL_COMMUNITY): Payer: Self-pay

## 2012-12-15 ENCOUNTER — Inpatient Hospital Stay (HOSPITAL_COMMUNITY)
Admission: EM | Admit: 2012-12-15 | Discharge: 2012-12-22 | DRG: 379 | Disposition: A | Discharge status: Home / Self Care | Payer: MEDICAID | Attending: Family Medicine | Admitting: Family Medicine

## 2012-12-15 DIAGNOSIS — F1910 Other psychoactive substance abuse, uncomplicated: Secondary | ICD-10-CM | POA: Diagnosis present

## 2012-12-15 DIAGNOSIS — E785 Hyperlipidemia, unspecified: Secondary | ICD-10-CM | POA: Diagnosis present

## 2012-12-15 DIAGNOSIS — I10 Essential (primary) hypertension: Secondary | ICD-10-CM

## 2012-12-15 DIAGNOSIS — F17200 Nicotine dependence, unspecified, uncomplicated: Secondary | ICD-10-CM | POA: Diagnosis present

## 2012-12-15 DIAGNOSIS — F1210 Cannabis abuse, uncomplicated: Secondary | ICD-10-CM | POA: Diagnosis present

## 2012-12-15 DIAGNOSIS — I129 Hypertensive chronic kidney disease with stage 1 through stage 4 chronic kidney disease, or unspecified chronic kidney disease: Secondary | ICD-10-CM | POA: Diagnosis present

## 2012-12-15 DIAGNOSIS — K5733 Diverticulitis of large intestine without perforation or abscess with bleeding: Principal | ICD-10-CM | POA: Diagnosis present

## 2012-12-15 DIAGNOSIS — K5732 Diverticulitis of large intestine without perforation or abscess without bleeding: Secondary | ICD-10-CM

## 2012-12-15 DIAGNOSIS — Q631 Lobulated, fused and horseshoe kidney: Secondary | ICD-10-CM

## 2012-12-15 DIAGNOSIS — K625 Hemorrhage of anus and rectum: Secondary | ICD-10-CM | POA: Diagnosis present

## 2012-12-15 DIAGNOSIS — K5792 Diverticulitis of intestine, part unspecified, without perforation or abscess without bleeding: Secondary | ICD-10-CM | POA: Diagnosis present

## 2012-12-15 DIAGNOSIS — N183 Chronic kidney disease, stage 3 unspecified: Secondary | ICD-10-CM | POA: Diagnosis present

## 2012-12-15 DIAGNOSIS — K226 Gastro-esophageal laceration-hemorrhage syndrome: Secondary | ICD-10-CM | POA: Diagnosis present

## 2012-12-15 DIAGNOSIS — Q638 Other specified congenital malformations of kidney: Secondary | ICD-10-CM

## 2012-12-15 DIAGNOSIS — F1410 Cocaine abuse, uncomplicated: Secondary | ICD-10-CM | POA: Diagnosis present

## 2012-12-15 HISTORY — DX: Serous retinal detachment, unspecified eye: H33.20

## 2012-12-15 HISTORY — DX: Hyperlipidemia, unspecified: E78.5

## 2012-12-15 HISTORY — DX: Chronic kidney disease, unspecified: N18.9

## 2012-12-15 HISTORY — DX: Shortness of breath: R06.02

## 2012-12-15 HISTORY — DX: Angina pectoris, unspecified: I20.9

## 2012-12-15 HISTORY — DX: Unspecified osteoarthritis, unspecified site: M19.90

## 2012-12-15 LAB — URINE MICROSCOPIC-ADD ON

## 2012-12-15 LAB — URINALYSIS, ROUTINE W REFLEX MICROSCOPIC
Glucose, UA: NEGATIVE mg/dL
Ketones, ur: 15 mg/dL — AB
Protein, ur: 100 mg/dL — AB

## 2012-12-15 LAB — TYPE AND SCREEN: Antibody Screen: NEGATIVE

## 2012-12-15 LAB — COMPREHENSIVE METABOLIC PANEL
ALT: 11 U/L (ref 0–35)
AST: 18 U/L (ref 0–37)
Alkaline Phosphatase: 153 U/L — ABNORMAL HIGH (ref 39–117)
CO2: 24 mEq/L (ref 19–32)
Calcium: 10.2 mg/dL (ref 8.4–10.5)
GFR calc non Af Amer: 36 mL/min — ABNORMAL LOW (ref >90)
Potassium: 3.9 mEq/L (ref 3.5–5.1)
Sodium: 141 mEq/L (ref 135–145)
Total Protein: 8 g/dL (ref 6.0–8.3)

## 2012-12-15 LAB — CBC WITH DIFFERENTIAL/PLATELET
Basophils Relative: 0 % (ref 0–1)
HCT: 50.6 % — ABNORMAL HIGH (ref 36.0–46.0)
MCHC: 33.8 g/dL (ref 30.0–36.0)
MCV: 81.9 fL (ref 78.0–100.0)
Monocytes Absolute: 0.5 10*3/uL (ref 0.1–1.0)
Monocytes Relative: 2 % — ABNORMAL LOW (ref 3–12)
RDW: 13.4 % (ref 11.5–15.5)

## 2012-12-15 MED ORDER — HYDROMORPHONE HCL PF 1 MG/ML IJ SOLN
1.0000 mg | Freq: Once | INTRAMUSCULAR | Status: AC
  Administered 2012-12-15: 1 mg via INTRAVENOUS
  Filled 2012-12-15: qty 1

## 2012-12-15 MED ORDER — IOHEXOL 300 MG/ML  SOLN
100.0000 mL | Freq: Once | INTRAMUSCULAR | Status: AC | PRN
  Administered 2012-12-15: 100 mL via INTRAVENOUS

## 2012-12-15 MED ORDER — ONDANSETRON HCL 4 MG/2ML IJ SOLN
4.0000 mg | Freq: Once | INTRAMUSCULAR | Status: AC
  Administered 2012-12-15: 4 mg via INTRAVENOUS
  Filled 2012-12-15: qty 2

## 2012-12-15 MED ORDER — SODIUM CHLORIDE 0.9 % IV BOLUS (SEPSIS)
1000.0000 mL | Freq: Once | INTRAVENOUS | Status: AC
  Administered 2012-12-15: 1000 mL via INTRAVENOUS

## 2012-12-15 MED ORDER — IOHEXOL 300 MG/ML  SOLN
20.0000 mL | INTRAMUSCULAR | Status: AC
  Administered 2012-12-15: 20 mL via ORAL

## 2012-12-15 NOTE — ED Provider Notes (Signed)
History     CSN: [id number]  Arrival date & time 12/15/12  1554   First MD Initiated Contact with Patient 12/15/12 1650      Chief Complaint  Patient presents with  . Rectal Bleeding    (Consider location/radiation/quality/duration/timing/severity/associated sxs/prior treatment) HPI Pt reports about 10am this morning she began having multiple episodes of vomiting and severe diffuse cramping abdominal pain. She reports several episodes of bright red blood per rectum since that time, no associated fever. She has not had any particular provoking or relieving factors. No sick contacts.   Past Medical History  Diagnosis Date  . Hypertension   . Hyperlipemia     No past surgical history on file.  No family history on file.  History  Substance Use Topics  . Smoking status: Never Smoker   . Smokeless tobacco: Not on file  . Alcohol Use: Yes     Comment: occ    OB History    Grav Para Term Preterm Abortions TAB SAB Ect Mult Living                  Review of Systems All other systems reviewed and are negative except as noted in HPI.   Allergies  Review of patient's allergies indicates no known allergies.  Home Medications   Current Outpatient Rx  Name  Route  Sig  Dispense  Refill  . AMLODIPINE BESYLATE 10 MG PO TABS   Oral   Take 10 mg by mouth daily.         Marland Kitchen VITAMIN D3 1000 UNITS PO TABS   Oral   Take 1,000 Units by mouth daily.         Marland Kitchen CLONIDINE HCL 0.2 MG PO TABS   Oral   Take 0.2 mg by mouth 2 (two) times daily.         . IBUPROFEN 200 MG PO TABS   Oral   Take 3 tablets (600 mg total) by mouth every 6 (six) hours as needed for pain.   30 tablet   0   . LISINOPRIL 20 MG PO TABS   Oral   Take 20 mg by mouth daily.         . OMEGA-3-ACID ETHYL ESTERS 1 G PO CAPS   Oral   Take 1 g by mouth 2 (two) times daily.           BP 134/94  Pulse 97  Temp 98.8 F (37.1 C) (Oral)  SpO2 99%  Physical Exam  Nursing note and vitals  reviewed. Constitutional: She is oriented to person, place, and time. She appears well-developed and well-nourished.  HENT:  Head: Normocephalic and atraumatic.  Eyes: EOM are normal. Pupils are equal, round, and reactive to light.  Neck: Normal range of motion. Neck supple.  Cardiovascular: Normal rate, normal heart sounds and intact distal pulses.   Pulmonary/Chest: Effort normal and breath sounds normal.  Abdominal: Bowel sounds are normal. She exhibits no distension. There is tenderness (diffuse tenderness). There is no rebound and no guarding.  Genitourinary:       Bright red blood on rectal exam, no external hemorrhoids  Musculoskeletal: Normal range of motion. She exhibits no edema and no tenderness.  Neurological: She is alert and oriented to person, place, and time. She has normal strength. No cranial nerve deficit or sensory deficit.  Skin: Skin is warm and dry. No rash noted.  Psychiatric: She has a normal mood and affect.    ED Course  Procedures (including critical care time)  Labs Reviewed  CBC WITH DIFFERENTIAL - Abnormal; Notable for the following:    WBC 25.8 (*)     RBC 6.18 (*)     Hemoglobin 17.1 (*)     HCT 50.6 (*)     Neutrophils Relative 96 (*)     Lymphocytes Relative 2 (*)     Monocytes Relative 2 (*)     Neutro Abs 24.8 (*)     Lymphs Abs 0.5 (*)     All other components within normal limits  COMPREHENSIVE METABOLIC PANEL - Abnormal; Notable for the following:    Glucose, Bld 161 (*)     Creatinine, Ser 1.55 (*)     Alkaline Phosphatase 153 (*)     GFR calc non Af Amer 36 (*)     GFR calc Af Amer 41 (*)     All other components within normal limits  LIPASE, BLOOD  URINALYSIS, ROUTINE W REFLEX MICROSCOPIC  TYPE AND SCREEN   No results found.   No diagnosis found.    MDM  Moved to CDU pending work up. Case discussed with Dahlia Client, PA who will follow labs and imaging.         Archit Leger B. Bernette Mayers, MD 12/15/12 2337

## 2012-12-15 NOTE — ED Provider Notes (Signed)
This is a 61 year old female, who presents emergency department with rectal bleeding. Patient also complained of associated nausea, vomiting, and abdominal pain. Reports bright red blood per rectum. She was initially evaluated by Dr. Bernette Mayers in [facility]. She was transferred to CDU for CT imaging. Patient states that she is still having some abdominal cramping, and nausea.  6:58 PM Endorses nausea, vomiting, BRBPR, and abdominal pain.  Patient denies headache, blurred vision, new hearing loss, sore throat, chest pain, shortness of breath,  constipation, dysuria, peripheral edema, back pain, numbness or tingling of the extremities.  PE: Gen: A&O x4 HEENT: PERRL, EOM intact CHEST: RRR, no m/r/g LUNGS: CTAB, no w/r/r ABD: BS x 4, diffuse abdominal tenderness. EXT: No edema, strong peripheral pulses NEURO: Sensation and strength intact bilaterally  Plan: Obtain CT imaging, and talk with Dr. Bernette Mayers.  9:03 PM Told that the patient vomited about 500cc of blood while in CT.  I discussed the patient with Dr. Bernette Mayers, who states that the patient will need to be admitted.  Results for orders placed during the hospital encounter of 12/15/12  URINALYSIS, ROUTINE W REFLEX MICROSCOPIC      Component Value Range   Color, Urine AMBER (*) YELLOW   APPearance CLOUDY (*) CLEAR   Specific Gravity, Urine 1.019  1.005 - 1.030   pH 5.0  5.0 - 8.0   Glucose, UA NEGATIVE  NEGATIVE mg/dL   Hgb urine dipstick SMALL (*) NEGATIVE   Bilirubin Urine SMALL (*) NEGATIVE   Ketones, ur 15 (*) NEGATIVE mg/dL   Protein, ur 147 (*) NEGATIVE mg/dL   Urobilinogen, UA 1.0  0.0 - 1.0 mg/dL   Nitrite NEGATIVE  NEGATIVE   Leukocytes, UA NEGATIVE  NEGATIVE  CBC WITH DIFFERENTIAL      Component Value Range   WBC 25.8 (*) 4.0 - 10.5 K/uL   RBC 6.18 (*) 3.87 - 5.11 MIL/uL   Hemoglobin 17.1 (*) 12.0 - 15.0 g/dL   HCT 82.9 (*) 56.2 - 13.0 %   MCV 81.9  78.0 - 100.0 fL   MCH 27.7  26.0 - 34.0 pg   MCHC 33.8  30.0 - 36.0 g/dL    RDW 86.5  78.4 - 69.6 %   Platelets 350  150 - 400 K/uL   Neutrophils Relative 96 (*) 43 - 77 %   Lymphocytes Relative 2 (*) 12 - 46 %   Monocytes Relative 2 (*) 3 - 12 %   Eosinophils Relative 0  0 - 5 %   Basophils Relative 0  0 - 1 %   Neutro Abs 24.8 (*) 1.7 - 7.7 K/uL   Lymphs Abs 0.5 (*) 0.7 - 4.0 K/uL   Monocytes Absolute 0.5  0.1 - 1.0 K/uL   Eosinophils Absolute 0.0  0.0 - 0.7 K/uL   Basophils Absolute 0.0  0.0 - 0.1 K/uL   Smear Review LARGE PLATELETS PRESENT    COMPREHENSIVE METABOLIC PANEL      Component Value Range   Sodium 141  135 - 145 mEq/L   Potassium 3.9  3.5 - 5.1 mEq/L   Chloride 101  96 - 112 mEq/L   CO2 24  19 - 32 mEq/L   Glucose, Bld 161 (*) 70 - 99 mg/dL   BUN 18  6 - 23 mg/dL   Creatinine, Ser 2.95 (*) 0.50 - 1.10 mg/dL   Calcium 28.4  8.4 - 13.2 mg/dL   Total Protein 8.0  6.0 - 8.3 g/dL   Albumin 4.3  3.5 -  5.2 g/dL   AST 18  0 - 37 U/L   ALT 11  0 - 35 U/L   Alkaline Phosphatase 153 (*) 39 - 117 U/L   Total Bilirubin 0.5  0.3 - 1.2 mg/dL   GFR calc non Af Amer 36 (*) >90 mL/min   GFR calc Af Amer 41 (*) >90 mL/min  LIPASE, BLOOD      Component Value Range   Lipase 17  11 - 59 U/L  TYPE AND SCREEN      Component Value Range   ABO/RH(D) O POS     Antibody Screen NEG     Sample Expiration 12/18/2012    ABO/RH      Component Value Range   ABO/RH(D) O POS    URINE MICROSCOPIC-ADD ON      Component Value Range   Squamous Epithelial / LPF FEW (*) RARE   RBC / HPF 0-2  <3 RBC/hpf   Bacteria, UA FEW (*) RARE   Casts HYALINE CASTS (*) NEGATIVE   Urine-Other MUCOUS PRESENT    HEMOGLOBIN AND HEMATOCRIT, BLOOD      Component Value Range   Hemoglobin 14.4  12.0 - 15.0 g/dL   HCT 72.5  36.6 - 44.0 %   Ct Abdomen Pelvis W Contrast  12/15/2012  *RADIOLOGY REPORT*  Clinical Data: Abdominal pain, nausea/vomiting, leukocytosis, rectal bleeding  CT ABDOMEN AND PELVIS WITH CONTRAST  Technique:  Multidetector CT imaging of the abdomen and pelvis was  performed following the standard protocol during bolus administration of intravenous contrast.  Contrast: OMNIPAQUE IOHEXOL 300 MG/ML  SOLN  Comparison: MRI abdomen dated 11/10/2008.  Findings: Motion degraded images due to patient vomiting during imaging.  Mild linear scarring versus atelectasis in the lateral left lung base.  Two stable cysts in the right hepatic lobe (series 11/images 12 and 16).  Spleen, pancreas, and adrenal glands are within normal limits.  Gallbladder is unremarkable.  No intrahepatic or extrahepatic ductal dilatation.  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 No hydronephrosis.  No evidence of bowel obstruction.  Thick-walled loop of colon near the splenic flexure (series 12/image 10).  Colonic diverticulosis with associated pericolonic inflammatory changes in the left upper abdomen (series 12/image 43), suggesting diverticulitis.  Tiny foci of gas adjacent to the descending colon are assumed to be within a diverticula (series 11/image 12), although a localized microperforation is also possible.  No drainable fluid collection or abscess.  No free air.  No evidence of abdominal aortic aneurysm.  No abdominopelvic ascites.  No suspicious abdominopelvic lymphadenopathy.  Uterus and bilateral ovaries are unremarkable.  Bladder is within normal limits.  Degenerative changes of the visualized thoracolumbar spine.  IMPRESSION:  Suspected descending colonic diverticulitis, as described above. Small area of localized microperforation is possible but is not favored.  No drainable fluid collection or abscess.  No free air.  Crossed fused renal ectopia.   Original Report Authenticated By: Charline Bills, M.D.      10:23 PM Dr. Pollie Meyer with Family Medicine will admit the patient.    Roxy Horseman, PA-C 12/15/12 2321  Roxy Horseman, PA-C 12/15/12 [phone]

## 2012-12-15 NOTE — ED Notes (Signed)
Pt presents to department for evaluation of bright red rectal bleeding abdominal pain, N/V and generalized fatigue. Onset this morning. 8/10 abdominal pain at the time. No active vomiting. Skin warm and dry. Pt is conscious alert and oriented x4.

## 2012-12-15 NOTE — ED Notes (Signed)
Ct called and informed of patient status.

## 2012-12-15 NOTE — ED Provider Notes (Signed)
Medical screening examination/treatment/procedure(s) were conducted as a shared visit with non-physician practitioner(s) and myself.  I personally evaluated the patient during the encounter   Charles B. Bernette Mayers, MD 12/15/12 [phone]

## 2012-12-15 NOTE — ED Notes (Signed)
CT called and informed this nurse pt vomited "blood" in CT scan. Informed PA.

## 2012-12-15 NOTE — H&P (Signed)
Family Medicine Teaching [facility] Admission History and Physical Service Pager: [phone]  Patient name: Jennifer Moss Medical record number: [id number] Date of birth: Jun 19, 1951 Age: 61 y.o. Gender: female  Primary Care Provider:  Previously went to [facility], which closed.  Chief Complaint: rectal bleeding  Assessment and Plan: TENESHA Jennifer Moss is a 61 y.o. year old female presenting with abdominal pain, vomiting, rectal bleeding, and an episode of hematemesis, found to have diverticulitis on CT scan. Patient is currently hemodynamically stable, with systolic BP's in the 130s. Rectal bleeding likely secondary to diverticulosis. Hgb dropped from 17.1 to 14.4 in the ED, although some of this may have been hemodilutional as patient received a 1L bolus in the ED. Hematemesis was originally reported to be 500cc of gross blood while in the CT scan room, but after talking with patient, she confirms that it was vomit that had some blood in it, not gross whole blood.   # GI: diverticulitis, rectal bleeding, and hematemesis. Concern for both upper GI bleed as well as lower GI bleed unless upper GI bleed is brisk and elevated initial hgb at admission would not suggest this. Patient with evidence of diverticulitiis and vomiting may have caused upper GI bleed. No history of alcohol use to suggest that as cause of esophageal varices. Patient with history of short term use NSAIDS 1 month ago but will avoid NSAIDS. Possible AVM. Hemoptysis also on differential and will obtain CXR (patient not reporting cough). Patient cocaine positive which places at risk for ischemic colitis with no signs of acute abdomen on exam. Crack cocaine inhalation could place at risk for hemoptysis as well. .  - Admit patient to floor with telemetry for close monitoring of vital signs - Plan for GI consult in the morning as patient will likely require at least upper endoscopy -will start IV protonix - NPO for now -follow  abdominal exam closely - If becomes hemodynamically unstable or exam worsens, will obtain surgery consult and consider transfer to stepdown  -tiny foci of gas noted and thought to be within diverticula although cannot rule out microperforation. May need surgery consult regardless of hemodynamic status.  - Treat diverticulitis with IV cipro & flagyl - Zofran 4mg  q6h prn nausea - Will obtain CXR to look for mediastinal widening/air since had hematemesis  # HEME/ID: Hgb down from 17.1 to 14.4 in ED, although possibly dilutional. Leukocytosis of 25.8. - Check CBC's q4h to follow hemoglobin -1 am labs show hgb 15.7, stable - IV cipro & flagyl as above  # CARDIOVASCULAR: hypertensive at baseline - holding home antihypertensives now while NPO - prn IV hydralazine for BP >180/100. At risk for rebound as holding clonidine-could consider patch.  - check EKG as patient has GI bleed, to monitor for ischemic changes  # NEURO: - Pain control with morphine 2mg  q2prn - check UDS, cocaine and marijuana positive.   # Renal History of horseshoe kidney. Appears to have underlying CKD wth Cr 2 years ago 1.3-1.4. Creatinine may have gradually worsened to baseline of 1.5 at this time which would be CKD Stage III. Will monitor to determine baseline. Will gently hydrate.   # FEN: - NS @ 125 cc/hr  # PROPHYLAXIS: - SCDs since bleeding  # DISPO:  - pending clinical improvement  # CODE STATUS:  - did not address with patient, full code for now  History of Present Illness: Jennifer Moss is a 61 y.o. year old female presenting with abdominal pain, vomiting, and rectal bleeding.  Prior to today, she felt well. This morning she woke up began to have vomiting, which persisted all day long. Around 2pm she had a bowel movement (that was in "spurts" but she was not straining) and when she wiped the tissue was covered in blood. She continued to vomit later into the day, and when the rectal bleeding happened again,  she decided to come to the ER. She hasn't been able to eat anything all day, and has not taken her blood pressure medicines today due to the vomiting.  She has had abdominal pain today as well. It is diffuse throughout the belly, without a single point of maximum tenderness. It was 9/10 at its worst and has eased off some since then. The pain comes in waves. She hasn't had any fevers at home.Abdominal pain was diffuse and 9/10 at its worst. It has eased off since then.   Denies sick contacts. Denies having had fevers at home. She has had a headache and some back pain. She had a mild dull chest pain just once when she was vomiting, which was not worse with ambulation. She has felt a little short of breath and fatigued. She does mention having some bumps on her legs chronically. She does not take ibuprofen or other NSAIDs on a regular basis chronically. She denies having a history of hemorrhoids. 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  In the ED, while in radiology for her CT scan, pt was reported to vomit approximately 500 cc of blood, however the patient says that this was not entirely blood. It was vomit, and had some blood in it.  Has not recently been told that she has any kidney disease.  Review Of Systems: Per HPI. Otherwise 12 point review of systems was performed and was unremarkable.   Past Medical History  Diagnosis Date  . Hypertension   . Hyperlipemia   . Anginal pain   . Shortness of breath   . Chronic kidney disease     rt kidney horseshoe  . Arthritis   . Retinal detachment     R eye, per patient    Past Surgical History  Procedure Date  . Diagnostic laparoscopy     for horseshoe kidney  . Appendectomy   . Lipoma removal     in 1990s had an egg-sized tumor removed from her leg, she thinks it was a lipoma    Home Medications: No current facility-administered medications on file prior to  encounter.   Current Outpatient Prescriptions on File Prior to Encounter  Medication Sig Dispense Refill  . amLODipine (NORVASC) 10 MG tablet Take 10 mg by mouth daily.      . cholecalciferol (VITAMIN D) 1000 UNITS tablet Take 1,000 Units by mouth daily.      . cloNIDine (CATAPRES) 0.2 MG tablet Take 0.2 mg by mouth 2 (two) times daily.      Marland Kitchen lisinopril (PRINIVIL,ZESTRIL) 20 MG tablet Take 20 mg by mouth daily.      Marland Kitchen omega-3 acid ethyl esters (LOVAZA) 1 G capsule Take 1 g by mouth 2 (two) times daily.        Social History: Tobacco: smokes socially (less than one cigarette per day) Alcohol: just drinks alcohol some on holidays (a glass of champagne) Drugs: occasional marijuana, did do some cocaine before but stopped reportedly after having chest pain and ED evaluation 1 month ago Lives with: lives with 53 year old daughter  Employment: janitorial work in the evenings  For any additional social history documentation, please refer to relevant sections of EMR.  Family History: Dad - diabetes Mom - deceased but doesn't know what happened Grandmother - heart disease and stroke  Cancer   Allergies: No Known Allergies  Physical Exam: BP 150/85  Pulse 75  Temp 100.2 F (37.9 C) (Oral)  Resp 32  SpO2 100% Exam: General: NAD, pleasant, cooperative, appears in mild pain HEENT: normocephalic, atraumatic, no oral exudates, PERRL, no LAD Cardiovascular: RRR, no murmurs auscultated Respiratory: CTAB Abdomen: diffusely moderately tender to palpation, no rigidity or peritoneal signs, no masses palpable Extremities: no lower extremity edema, no calf tenderness. Points out a small soft tissue mass in her right leg (outer thigh), that is barely palpable and is ~0.5 cm in diameter, nontender GU: external rectal exam shows no gross blood, no visible fissures Skin: no rashes Neuro: alert and oriented, speech intact  Labs and Imaging:  CBC:    Component Value Date/Time   WBC 25.8*  12/15/2012 1618   HGB 14.4 12/15/2012 2136   HCT 42.9 12/15/2012 2136   PLT 350 12/15/2012 1618   MCV 81.9 12/15/2012 1618   NEUTROABS 24.8* 12/15/2012 1618   LYMPHSABS 0.5* 12/15/2012 1618   MONOABS 0.5 12/15/2012 1618   EOSABS 0.0 12/15/2012 1618   BASOSABS 0.0 12/15/2012 1618   Comprehensive Metabolic Panel:    Component Value Date/Time   NA 141 12/15/2012 1618   K 3.9 12/15/2012 1618   CL 101 12/15/2012 1618   CO2 24 12/15/2012 1618   BUN 18 12/15/2012 1618   CREATININE 1.55* 12/15/2012 1618   GLUCOSE 161* 12/15/2012 1618   CALCIUM 10.2 12/15/2012 1618   AST 18 12/15/2012 1618   ALT 11 12/15/2012 1618   ALKPHOS 153* 12/15/2012 1618   BILITOT 0.5 12/15/2012 1618   PROT 8.0 12/15/2012 1618   ALBUMIN 4.3 12/15/2012 1618  Lipase 18 (WNL)   Urinalysis    Component Value Date/Time   COLORURINE AMBER* 12/15/2012 1940   APPEARANCEUR CLOUDY* 12/15/2012 1940   LABSPEC 1.019 12/15/2012 1940   PHURINE 5.0 12/15/2012 1940   GLUCOSEU NEGATIVE 12/15/2012 1940   HGBUR SMALL* 12/15/2012 1940   BILIRUBINUR SMALL* 12/15/2012 1940   KETONESUR 15* 12/15/2012 1940   PROTEINUR 100* 12/15/2012 1940   UROBILINOGEN 1.0 12/15/2012 1940   NITRITE NEGATIVE 12/15/2012 1940   LEUKOCYTESUR NEGATIVE 12/15/2012 1940    UDS from Nov 2013 positive for THC and cocaine  CT Abd Pelvis with Contrast: No evidence of bowel obstruction. Thick-walled loop of colon near the splenic flexure. Colonic diverticulosis with associated pericolonic inflammatory changes in the left upper abdomen, suggesting diverticulitis. Tiny foci of gas adjacent to the descending colon are assumed to be within a diverticula (series 11/image 12), although a localized microperforation is also possible. No drainable fluid collection or abscess. No free air. Impression: Suspected descending colonic diverticulitis, as described above. Small area of localized microperforation is possible but is not favored. No drainable fluid  collection or abscess. No free air. Crossed fused renal ectopia.  Levert Feinstein, MD Family Medicine PGY-1  Family Medicine Upper Level Addendum:   I have seen and examined the patient independently, discussed with Dr. Pollie Meyer, fully reviewed the H+P and agree with it's contents with the additions as noted in blue text. My independent exam is below.    Tana Conch, MD, PGY2 12/16/2012 4:49 AM

## 2012-12-16 ENCOUNTER — Encounter (HOSPITAL_COMMUNITY): Payer: Self-pay

## 2012-12-16 DIAGNOSIS — K5732 Diverticulitis of large intestine without perforation or abscess without bleeding: Secondary | ICD-10-CM

## 2012-12-16 LAB — BASIC METABOLIC PANEL
Chloride: 104 mEq/L (ref 96–112)
GFR calc Af Amer: 41 mL/min — ABNORMAL LOW (ref >90)
Potassium: 3.5 mEq/L (ref 3.5–5.1)

## 2012-12-16 LAB — CBC
HCT: 44.1 % (ref 36.0–46.0)
HCT: 43.6 % (ref 36.0–46.0)
Hemoglobin: 14.5 g/dL (ref 12.0–15.0)
MCH: 27.3 pg (ref 26.0–34.0)
MCH: 27.3 pg (ref 26.0–34.0)
MCHC: 33.5 g/dL (ref 30.0–36.0)
Platelets: 326 10*3/uL (ref 150–400)
Platelets: 303 10*3/uL (ref 150–400)
RBC: 5.76 MIL/uL — ABNORMAL HIGH (ref 3.87–5.11)
RBC: 5.31 MIL/uL — ABNORMAL HIGH (ref 3.87–5.11)
RDW: 13.5 % (ref 11.5–15.5)
WBC: 22.5 10*3/uL — ABNORMAL HIGH (ref 4.0–10.5)

## 2012-12-16 LAB — RAPID URINE DRUG SCREEN, HOSP PERFORMED
Amphetamines: NOT DETECTED
Opiates: NOT DETECTED
Tetrahydrocannabinol: POSITIVE — AB

## 2012-12-16 LAB — MRSA PCR SCREENING: MRSA by PCR: NEGATIVE

## 2012-12-16 MED ORDER — CIPROFLOXACIN IN D5W 400 MG/200ML IV SOLN
400.0000 mg | Freq: Two times a day (BID) | INTRAVENOUS | Status: DC
  Administered 2012-12-16 – 2012-12-17 (×3): 400 mg via INTRAVENOUS
  Filled 2012-12-16 (×4): qty 200

## 2012-12-16 MED ORDER — HYDRALAZINE HCL 20 MG/ML IJ SOLN
10.0000 mg | Freq: Four times a day (QID) | INTRAMUSCULAR | Status: DC | PRN
  Filled 2012-12-16: qty 0.5

## 2012-12-16 MED ORDER — ONDANSETRON HCL 4 MG PO TABS
4.0000 mg | ORAL_TABLET | Freq: Four times a day (QID) | ORAL | Status: DC | PRN
  Administered 2012-12-21: 4 mg via ORAL
  Filled 2012-12-16: qty 1

## 2012-12-16 MED ORDER — ONDANSETRON HCL 4 MG/2ML IJ SOLN
4.0000 mg | Freq: Four times a day (QID) | INTRAMUSCULAR | Status: DC | PRN
  Administered 2012-12-16 – 2012-12-20 (×6): 4 mg via INTRAVENOUS
  Filled 2012-12-16 (×6): qty 2

## 2012-12-16 MED ORDER — SODIUM CHLORIDE 0.9 % IJ SOLN
3.0000 mL | Freq: Two times a day (BID) | INTRAMUSCULAR | Status: DC
  Administered 2012-12-16 – 2012-12-22 (×6): 3 mL via INTRAVENOUS

## 2012-12-16 MED ORDER — SODIUM CHLORIDE 0.9 % IV SOLN
INTRAVENOUS | Status: DC
  Administered 2012-12-16: 01:00:00 via INTRAVENOUS
  Administered 2012-12-17: 125 mL/h via INTRAVENOUS
  Administered 2012-12-18 – 2012-12-19 (×4): via INTRAVENOUS
  Administered 2012-12-19: 20 mL/h via INTRAVENOUS

## 2012-12-16 MED ORDER — PANTOPRAZOLE SODIUM 40 MG IV SOLR: 40.0000 mg | Freq: Two times a day (BID) | INTRAVENOUS | Status: DC

## 2012-12-16 MED ORDER — SODIUM CHLORIDE 0.9 % IV SOLN
INTRAVENOUS | Status: DC
  Administered 2012-12-16: 20 mL/h via INTRAVENOUS
  Administered 2012-12-17: 500 mL via INTRAVENOUS

## 2012-12-16 MED ORDER — PANTOPRAZOLE SODIUM 40 MG IV SOLR
40.0000 mg | Freq: Two times a day (BID) | INTRAVENOUS | Status: DC
  Administered 2012-12-16 – 2012-12-18 (×5): 40 mg via INTRAVENOUS
  Filled 2012-12-16 (×8): qty 40

## 2012-12-16 MED ORDER — METRONIDAZOLE IN NACL 5-0.79 MG/ML-% IV SOLN
500.0000 mg | Freq: Three times a day (TID) | INTRAVENOUS | Status: DC
  Administered 2012-12-16 – 2012-12-18 (×9): 500 mg via INTRAVENOUS
  Filled 2012-12-16 (×11): qty 100

## 2012-12-16 MED ORDER — MORPHINE SULFATE 2 MG/ML IJ SOLN
2.0000 mg | INTRAMUSCULAR | Status: DC | PRN
  Administered 2012-12-16 – 2012-12-18 (×8): 2 mg via INTRAVENOUS
  Filled 2012-12-16 (×8): qty 1

## 2012-12-16 NOTE — Consult Note (Signed)
Reason for Consult: Abdominal pain possible perforated diverticulitis Referring Physician:McIntyre  Jennifer Moss is an 61 y.o. female.  Jennifer Moss is a 61 y.o. year old female presenting with abdominal pain, vomiting, rectal bleeding, and an episode of hematemesis, found to have diverticulitis on CT scan. Patient is currently hemodynamically stable, with systolic BP's in the 130s. Rectal bleeding likely secondary to diverticulosis. Concern for both upper GI bleed as well as lower GI bleed unless upper GI bleed is brisk, but elevated initial hgb at admission speaks against this. Patient with evidence of diverticulitiis, and most likely explanation for GI bleed is repeated vomiting. No history of alcohol use to suggest that as cause of esophageal varices.  Patient with history of short term use of NSAIDS 1 month ago but will avoid NSAIDS in the meantime. Could possibly have an AVM. 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 Crack cocaine inhalation could place her at risk for hemoptysis as well.   We are asked to see the patient on consult for evaluation and treatment recommendations of her diverticulitis found on CT exam.   Past Medical History  Diagnosis Date  . Hypertension   . Hyperlipemia   . Anginal pain   . Shortness of breath   . Chronic kidney disease     rt kidney horseshoe  . Arthritis   . Retinal detachment     R eye, per patient    Past Surgical History  Procedure Date  . Diagnostic laparoscopy     for horseshoe kidney  . Appendectomy   . Lipoma removal     in 1990s had an egg-sized tumor removed from her leg, she thinks it was a lipoma    History reviewed. No pertinent family history.  Social History:  reports that she has never smoked. She has never used smokeless tobacco. She reports that she drinks alcohol. She reports that she uses illicit drugs (Marijuana) about 3 times per  week.  Allergies: No Known Allergies  Medications: I have reviewed the patient's current medications.  Results for orders placed during the hospital encounter of 12/15/12 (from the past 48 hour(s))  CBC WITH DIFFERENTIAL     Status: Abnormal   Collection Time   12/15/12  4:18 PM      Component Value Range Comment   WBC 25.8 (*) 4.0 - 10.5 K/uL    RBC 6.18 (*) 3.87 - 5.11 MIL/uL    Hemoglobin 17.1 (*) 12.0 - 15.0 g/dL    HCT 40.9 (*) 81.1 - 46.0 %    MCV 81.9  78.0 - 100.0 fL    MCH 27.7  26.0 - 34.0 pg    MCHC 33.8  30.0 - 36.0 g/dL    RDW 91.4  78.2 - 95.6 %    Platelets 350  150 - 400 K/uL    Neutrophils Relative 96 (*) 43 - 77 %    Lymphocytes Relative 2 (*) 12 - 46 %    Monocytes Relative 2 (*) 3 - 12 %    Eosinophils Relative 0  0 - 5 %    Basophils Relative 0  0 - 1 %    Neutro Abs 24.8 (*) 1.7 - 7.7 K/uL    Lymphs Abs 0.5 (*) 0.7 - 4.0 K/uL    Monocytes Absolute 0.5  0.1 - 1.0 K/uL    Eosinophils Absolute 0.0  0.0 - 0.7 K/uL    Basophils  Absolute 0.0  0.0 - 0.1 K/uL    Smear Review LARGE PLATELETS PRESENT     COMPREHENSIVE METABOLIC PANEL     Status: Abnormal   Collection Time   12/15/12  4:18 PM      Component Value Range Comment   Sodium 141  135 - 145 mEq/L    Potassium 3.9  3.5 - 5.1 mEq/L    Chloride 101  96 - 112 mEq/L    CO2 24  19 - 32 mEq/L    Glucose, Bld 161 (*) 70 - 99 mg/dL    BUN 18  6 - 23 mg/dL    Creatinine, Ser 4.09 (*) 0.50 - 1.10 mg/dL    Calcium 81.1  8.4 - 10.5 mg/dL    Total Protein 8.0  6.0 - 8.3 g/dL    Albumin 4.3  3.5 - 5.2 g/dL    AST 18  0 - 37 U/L    ALT 11  0 - 35 U/L    Alkaline Phosphatase 153 (*) 39 - 117 U/L    Total Bilirubin 0.5  0.3 - 1.2 mg/dL    GFR calc non Af Amer 36 (*) >90 mL/min    GFR calc Af Amer 41 (*) >90 mL/min   LIPASE, BLOOD     Status: Normal   Collection Time   12/15/12  4:18 PM      Component Value Range Comment   Lipase 17  11 - 59 U/L   URINE RAPID DRUG SCREEN (HOSP PERFORMED)     Status: Abnormal    Collection Time   12/15/12  4:34 PM      Component Value Range Comment   Opiates NONE DETECTED  NONE DETECTED    Cocaine POSITIVE (*) NONE DETECTED    Benzodiazepines NONE DETECTED  NONE DETECTED    Amphetamines NONE DETECTED  NONE DETECTED    Tetrahydrocannabinol POSITIVE (*) NONE DETECTED    Barbiturates NONE DETECTED  NONE DETECTED   TYPE AND SCREEN     Status: Normal   Collection Time   12/15/12  5:05 PM      Component Value Range Comment   ABO/RH(D) O POS      Antibody Screen NEG      Sample Expiration 12/18/2012     ABO/RH     Status: Normal   Collection Time   12/15/12  5:05 PM      Component Value Range Comment   ABO/RH(D) O POS     OCCULT BLOOD, POC DEVICE     Status: Abnormal   Collection Time   12/15/12  5:36 PM      Component Value Range Comment   Fecal Occult Bld POSITIVE (*) NEGATIVE   URINALYSIS, ROUTINE W REFLEX MICROSCOPIC     Status: Abnormal   Collection Time   12/15/12  7:40 PM      Component Value Range Comment   Color, Urine AMBER (*) YELLOW BIOCHEMICALS MAY BE AFFECTED BY COLOR   APPearance CLOUDY (*) CLEAR    Specific Gravity, Urine 1.019  1.005 - 1.030    pH 5.0  5.0 - 8.0    Glucose, UA NEGATIVE  NEGATIVE mg/dL    Hgb urine dipstick SMALL (*) NEGATIVE    Bilirubin Urine SMALL (*) NEGATIVE    Ketones, ur 15 (*) NEGATIVE mg/dL    Protein, ur 914 (*) NEGATIVE mg/dL    Urobilinogen, UA 1.0  0.0 - 1.0 mg/dL    Nitrite NEGATIVE  NEGATIVE  Leukocytes, UA NEGATIVE  NEGATIVE   URINE MICROSCOPIC-ADD ON     Status: Abnormal   Collection Time   12/15/12  7:40 PM      Component Value Range Comment   Squamous Epithelial / LPF FEW (*) RARE    RBC / HPF 0-2  <3 RBC/hpf    Bacteria, UA FEW (*) RARE    Casts HYALINE CASTS (*) NEGATIVE    Urine-Other MUCOUS PRESENT     HEMOGLOBIN AND HEMATOCRIT, BLOOD     Status: Normal   Collection Time   12/15/12  9:36 PM      Component Value Range Comment   Hemoglobin 14.4  12.0 - 15.0 g/dL    HCT 91.4  78.2 -  95.6 %   CBC     Status: Abnormal   Collection Time   12/16/12 12:45 AM      Component Value Range Comment   WBC 25.9 (*) 4.0 - 10.5 K/uL    RBC 5.76 (*) 3.87 - 5.11 MIL/uL    Hemoglobin 15.7 (*) 12.0 - 15.0 g/dL    HCT 21.3 (*) 08.6 - 46.0 %    MCV 81.3  78.0 - 100.0 fL    MCH 27.3  26.0 - 34.0 pg    MCHC 33.5  30.0 - 36.0 g/dL    RDW 57.8  46.9 - 62.9 %    Platelets 326  150 - 400 K/uL   MRSA PCR SCREENING     Status: Normal   Collection Time   12/16/12 12:48 AM      Component Value Range Comment   MRSA by PCR NEGATIVE  NEGATIVE   CBC     Status: Abnormal   Collection Time   12/16/12  5:22 AM      Component Value Range Comment   WBC 22.5 (*) 4.0 - 10.5 K/uL    RBC 5.40 (*) 3.87 - 5.11 MIL/uL    Hemoglobin 14.6  12.0 - 15.0 g/dL    HCT 52.8  41.3 - 24.4 %    MCV 81.7  78.0 - 100.0 fL    MCH 27.0  26.0 - 34.0 pg    MCHC 33.1  30.0 - 36.0 g/dL    RDW 01.0  27.2 - 53.6 %    Platelets 303  150 - 400 K/uL   BASIC METABOLIC PANEL     Status: Abnormal   Collection Time   12/16/12  5:22 AM      Component Value Range Comment   Sodium 141  135 - 145 mEq/L    Potassium 3.5  3.5 - 5.1 mEq/L    Chloride 104  96 - 112 mEq/L    CO2 24  19 - 32 mEq/L    Glucose, Bld 85  70 - 99 mg/dL    BUN 18  6 - 23 mg/dL    Creatinine, Ser 6.44 (*) 0.50 - 1.10 mg/dL    Calcium 8.9  8.4 - 03.4 mg/dL    GFR calc non Af Amer 35 (*) >90 mL/min    GFR calc Af Amer 41 (*) >90 mL/min     Dg Chest 2 View  12/16/2012  *RADIOLOGY REPORT*  Clinical Data: Hemoptysis, nausea/vomiting  CHEST - 2 VIEW  Comparison: 11/05/2012  Findings: Lungs are clear. No pleural effusion or pneumothorax.  Cardiomediastinal silhouette is within normal limits.  Degenerative changes of the visualized thoracolumbar spine.  IMPRESSION: No evidence of acute cardiopulmonary disease.   Original Report Authenticated By: Charline Bills,  M.D.    Ct Abdomen Pelvis W Contrast  12/15/2012  *RADIOLOGY REPORT*  Clinical Data: Abdominal  pain, nausea/vomiting, leukocytosis, rectal bleeding  CT ABDOMEN AND PELVIS WITH CONTRAST  Technique:  Multidetector CT imaging of the abdomen and pelvis was performed following the standard protocol during bolus administration of intravenous contrast.  Contrast: OMNIPAQUE IOHEXOL 300 MG/ML  SOLN  Comparison: MRI abdomen dated 11/10/2008.  Findings: Motion degraded images due to patient vomiting during imaging.  Mild linear scarring versus atelectasis in the lateral left lung base.  Two stable cysts in the right hepatic lobe (series 11/images 12 and 16).  Spleen, pancreas, and adrenal glands are within normal limits.  Gallbladder is unremarkable.  No intrahepatic or extrahepatic ductal dilatation.  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 No hydronephrosis.  No evidence of bowel obstruction.  Thick-walled loop of colon near the splenic flexure (series 12/image 10).  Colonic diverticulosis with associated pericolonic inflammatory changes in the left upper abdomen (series 12/image 43), suggesting diverticulitis.  Tiny foci of gas adjacent to the descending colon are assumed to be within a diverticula (series 11/image 12), although a localized microperforation is also possible.  No drainable fluid collection or abscess.  No free air.  No evidence of abdominal aortic aneurysm.  No abdominopelvic ascites.  No suspicious abdominopelvic lymphadenopathy.  Uterus and bilateral ovaries are unremarkable.  Bladder is within normal limits.  Degenerative changes of the visualized thoracolumbar spine.  IMPRESSION:  Suspected descending colonic diverticulitis, as described above. Small area of localized microperforation is possible but is not favored.  No drainable fluid collection or abscess.  No free air.  Crossed fused renal ectopia.   Original Report Authenticated By: Charline Bills, M.D.     Review of Systems  Constitutional: Positive for fever. Negative for chills, weight  loss, malaise/fatigue and diaphoresis.  HENT: Negative.   Eyes: Negative.   Respiratory: Negative.   Cardiovascular: Negative.   Gastrointestinal: Positive for nausea and abdominal pain. Negative for heartburn, vomiting, diarrhea, constipation, blood in stool and melena.  Genitourinary: Negative.   Musculoskeletal: Negative.   Skin: Negative.   Neurological: Negative.  Negative for weakness.  Endo/Heme/Allergies: Negative.   Psychiatric/Behavioral: Negative.    Blood pressure 123/81, pulse 87, temperature 99.2 F (37.3 C), temperature source Oral, resp. rate 20, height 5' (1.524 m), weight 133 lb 12.8 oz (60.691 kg), SpO2 98.00%. Physical Exam  Constitutional: She appears well-developed and well-nourished. No distress.  HENT:  Head: Normocephalic and atraumatic.  Mouth/Throat: No oropharyngeal exudate.  Eyes: Conjunctivae normal and EOM are normal. Pupils are equal, round, and reactive to light. Right eye exhibits no discharge. Left eye exhibits no discharge. No scleral icterus.  Neck: Normal range of motion. Neck supple. No JVD present. No tracheal deviation present. No thyromegaly present.  Cardiovascular: Normal rate, regular rhythm, normal heart sounds and intact distal pulses.  Exam reveals no gallop and no friction rub.   No murmur heard. Respiratory: Effort normal and breath sounds normal. No stridor. No respiratory distress. She has no wheezes. She has no rales. She exhibits no tenderness.  GI: Soft. Bowel sounds are normal. She exhibits no distension and no mass. There is tenderness. There is no rebound and no guarding.       Mostly in LLQ and supra pubic region less so in The RLQ.  Musculoskeletal: Normal range of motion. She exhibits no edema and no tenderness.  Lymphadenopathy:    She has no cervical adenopathy.  Neurological: She is alert.  Skin: Skin is warm and dry. No rash noted. She is not diaphoretic. No erythema. No pallor.  Psychiatric: She has a normal mood and  affect.    Assessment/Plan: diverticulitis, rectal bleeding, and hematemesis (no previous episodes reported)  Plan: Agree with current plan of treatment, keep NPO IVF, and ABX. Follow clinical picture, labs. If patient exhibits lack of improvement H&H continues to drop or is having more abdominal pain/discomfort would obtain follow up CT. Depending upon those results a GI consult, perc drain or possibly a colon resection may be indicated.    We will continue to follow along with you. Thank you for this consult.     [name] Pager # [phone] 12/16/2012, 11:51 AM

## 2012-12-16 NOTE — Progress Notes (Signed)
FMTS Attending Daily Note:  Renold Don MD  [phone] pager  Family Practice pager:  [phone] I have discussed this patient with the resident and attending physician Dr. Jennette Kettle.  I agree with their findings, assessment, and care plan

## 2012-12-16 NOTE — H&P (Signed)
FMTS Attending Admission Note: Jennifer Levy MD [phone] pager office [phone] I  have seen and examined this patient, reviewed their chart. I have discussed this patient with the resident. I agree with the resident's findings, assessment and care plan. Her exam is relatively benign (no rebound or guarding ) but she seems to be having some episodic  Episodes where pain is 6/10. I will ask surgery to come evaluate--she does not have an acute abdomen at this time, but I would like them to be on board in case this is indeed a microperforation. Agree with GI consult. Consider zosyn to broaden coverage.

## 2012-12-16 NOTE — Consult Note (Signed)
Subjective:   HPI  The patient is a 61 year old female who began to experience lower abdominal pain yesterday and persistent vomiting. She states she vomited for hours and then she began to see coffee-ground emesis and also noted a small amount of blood which was bright red per rectum. Because of her ongoing symptoms she came to the emergency room where she was evaluated. A CT scan of the abdomen showed descending colon diverticulitis with a possible microperforation however this was not definite. She is currently on antibiotics and she has been seen by the surgical department and being treated with antibiotic therapy for now. In regards to her hematemesis she has never had this before. She denied ever having had an ulcer in the past to her knowledge. She was not complaining of heartburn or epigastric pain.  Review of Systems She denies chest pain or shortness of breath  Past Medical History  Diagnosis Date  . Hypertension   . Hyperlipemia   . Anginal pain   . Shortness of breath   . Chronic kidney disease     rt kidney horseshoe  . Arthritis   . Retinal detachment     R eye, per patient   Past Surgical History  Procedure Date  . Diagnostic laparoscopy     for horseshoe kidney  . Appendectomy   . Lipoma removal     in 1990s had an egg-sized tumor removed from her leg, she thinks it was a lipoma   History   Social History  . Marital Status: Legally Separated    Spouse Name: N/A    Number of Children: N/A  . Years of Education: N/A   Occupational History  . Not on file.   Social History Main Topics  . Smoking status: Never Smoker   . Smokeless tobacco: Never Used  . Alcohol Use: Yes     Comment: occ  . Drug Use: 3 per week    Special: Marijuana     Comment: cocaine and THC + in UDS in November 2013  . Sexually Active: No   Other Topics Concern  . Not on file   Social History Narrative  . No narrative on file   family history is not on file. Current  facility-administered medications:0.9 %  sodium chloride infusion, , Intravenous, Continuous, Latrelle Dodrill, MD, Last Rate: 125 mL/hr at 12/16/12 0104;  ciprofloxacin (CIPRO) IVPB 400 mg, 400 mg, Intravenous, Q12H, Latrelle Dodrill, MD, 400 mg at 12/16/12 1255;  hydrALAZINE (APRESOLINE) injection 10 mg, 10 mg, Intravenous, Q6H PRN, Latrelle Dodrill, MD metroNIDAZOLE (FLAGYL) IVPB 500 mg, 500 mg, Intravenous, Q8H, Latrelle Dodrill, MD, 500 mg at 12/16/12 1610;  morphine 2 MG/ML injection 2 mg, 2 mg, Intravenous, Q2H PRN, Latrelle Dodrill, MD, 2 mg at 12/16/12 0804;  ondansetron (ZOFRAN) injection 4 mg, 4 mg, Intravenous, Q6H PRN, Latrelle Dodrill, MD, 4 mg at 12/16/12 0313;  ondansetron (ZOFRAN) tablet 4 mg, 4 mg, Oral, Q6H PRN, Latrelle Dodrill, MD pantoprazole (PROTONIX) injection 40 mg, 40 mg, Intravenous, Q12H, Shelva Majestic, MD, 40 mg at 12/16/12 0450;  sodium chloride 0.9 % injection 3 mL, 3 mL, Intravenous, Q12H, Latrelle Dodrill, MD, 3 mL at 12/16/12 0804 No Known Allergies   Objective:     BP 123/81  Pulse 87  Temp 99.2 F (37.3 C) (Oral)  Resp 20  Ht 5' (1.524 m)  Wt 60.691 kg (133 lb 12.8 oz)  BMI 26.13 kg/m2  SpO2 98%  She  is in no acute distress  Nonicteric  Heart regular rhythm no murmurs  Lungs clear  Abdomen: Soft, mild tenderness diffusely.  Laboratory No components found with this basename: d1      Assessment:     #1. Diverticulitis  #2. Mild rectal bleeding. Could be hemorrhoidal or diverticular origin.  #3. Hematemesis characterized by coffee-ground emesis.      Plan:     In regards to the diverticulitis this is being treated with antibiotics.  In regards to rectal bleeding this could be secondary to hemorrhoidal disease or from the diverticula in the colon. In any event we would not want to do a colonoscopy at this time given her acute diverticulitis, but it certainly would be warranted at some point in the future, since  she told me she has never had a colonoscopy.  In regards to the hematemesis the etiology of this is unclear. It could be from gastritis, or a Mallory-Weiss tear, or peptic ulcer disease. It is hard to say at this time. She is hemodynamically stable and there is no significant drop in H&H. We talked about this, and to clarify, and make sure nothing serious is going on in the upper GI tract causing the hematemesis we will proceed with EGD tomorrow.  Lab Results  Component Value Date   HGB 14.6 12/16/2012   HGB 15.7* 12/16/2012   HGB 14.4 12/15/2012   HCT 44.1 12/16/2012   HCT 46.8* 12/16/2012   HCT 42.9 12/15/2012   ALKPHOS 153* 12/15/2012   ALKPHOS 104 11/05/2012   ALKPHOS 80 02/22/2010   AST 18 12/15/2012   AST 24 11/05/2012   AST 12 02/22/2010   ALT 11 12/15/2012   ALT 14 11/05/2012   ALT 8 02/22/2010

## 2012-12-16 NOTE — Consult Note (Signed)
Patient interviewed and examined, agree with NP note above. I have interviewed and examined the patient and reviewed her imaging. She appears to have diverticulitis with possibly a microperforation. Her tenderness is localized as he appears stable. Agree with nonoperative management for now and we will follow closely. Mariella Saa MD, FACS  12/16/2012 6:34 PM

## 2012-12-16 NOTE — Progress Notes (Signed)
Family Medicine Teaching Service Daily Progress Note Service Page: [phone]  Patient Assessment: Jennifer Moss is a 61 y.o. year old female presenting with abdominal pain, vomiting, rectal bleeding, and an episode of hematemesis, found to have diverticulitis on CT scan with ?microperforation.  Subjective: Pt continues to complain of abdominal pain, although she says her pain is much improved from last night.  Objective: Temp:  [98.8 F (37.1 C)-100.2 F (37.9 C)] 99.2 F (37.3 C) (12/19 0514) Pulse Rate:  [69-102] 87  (12/19 0514) Resp:  [20-32] 20  (12/19 0514) BP: (116-150)/(74-94) 123/81 mmHg (12/19 0514) SpO2:  [98 %-100 %] 98 % (12/19 0514) Weight:  [133 lb 12.8 oz (60.691 kg)] 133 lb 12.8 oz (60.691 kg) (12/19 0032) Exam: General: NAD, appears in moderate pain, lying in bed Cardiovascular: RRR Respiratory: CTAB via anterior auscultation, normal respiratory effort Abdomen: soft, diffusely tender to palpation, no rebound tenderness, no masses or peritoneal signs Extremities: SCD's in place, calves nontender  I have reviewed the patient's medications, labs, imaging, and diagnostic testing.  Notable results are summarized below.  Medications:  Scheduled Meds: Cipro 400mg  IV q12h Flagyl 500mg  IV q8h Protonix 40mg  IV q12h  PRN Meds: Hydralazine 10mg  q6hprn BP > 180/100 Morphine 2mg  q2h prn  IVF: NS @ 125 cc/hr  Labs:  CBC  Lab 12/16/12 0522 12/16/12 0045 12/15/12 2136 12/15/12 1618  WBC 22.5* 25.9* -- 25.8*  HGB 14.6 15.7* 14.4 --  HCT 44.1 46.8* 42.9 --  PLT 303 326 -- 350    BMET  Lab 12/16/12 0522 12/15/12 1618  NA 141 141  K 3.5 3.9  CL 104 101  CO2 24 24  BUN 18 18  CREATININE 1.56* 1.55*  GLUCOSE 85 161*  CALCIUM 8.9 10.2    Imaging/Diagnostic Tests:  CT Abd Pelvis with Contrast:  No evidence of bowel obstruction. Thick-walled loop of colon near the splenic flexure. Colonic diverticulosis with associated pericolonic inflammatory changes in  the left upper abdomen, suggesting diverticulitis. Tiny foci of gas adjacent to the descending colon are assumed to be within a diverticula (series 11/image 12), although a localized microperforation is also possible. No drainable fluid collection or abscess. No free air. Impression: Suspected descending colonic diverticulitis, as described above. Small area of localized microperforation is possible but is not favored. No drainable fluid collection or abscess. No free air. Crossed fused renal ectopia.  CXR: No evidence of acute cardiopulmonary disease.  EKG:  Normal sinus rhythm, with some new T wave flattening in v4-v6  Assessment/Plan:  Jennifer Moss is a 61 y.o. year old female presenting with abdominal pain, vomiting, rectal bleeding, and an episode of hematemesis, found to have diverticulitis on CT scan. Patient is currently hemodynamically stable, with systolic BP's in the 130s. Rectal bleeding likely secondary to diverticulosis. Concern for both upper GI bleed as well as lower GI bleed unless upper GI bleed is brisk, but elevated initial hgb at admission speaks against this. Patient with evidence of diverticulitiis, and most likely explanation for GI bleed is repeated vomiting. No history of alcohol use to suggest that as cause of esophageal varices. Patient with history of short term use of NSAIDS 1 month ago but will avoid NSAIDS in the meantime. Could possibly have an AVM. 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 Crack cocaine inhalation could place her at risk for hemoptysis as well.   # GI: diverticulitis, rectal bleeding, and hematemesis. -  Call GI today, as patient will likely need scope - Will also calls surgery due to possible microperforation of bowel - NPO for now - IV protonix - Treat diverticulitis with IV cipro & flagyl  - follow abdominal exam closely  - If becomes hemodynamically unstable or exam  worsens, will consider transfer to stepdown and consider changing abx to Zosyn - Zofran 4mg  q6h prn nausea   # HEME/ID:   - CBC's to follow leukocytosis and hemoglobin (will check q12h today, then daily if stable) - Hgb stable at 14.6 this morning, WBC down to 22.5 (although all cell lines down) - IV cipro & flagyl as above   # CARDIOVASCULAR: hypertensive at baseline  - holding home antihypertensives now while NPO  - prn IV hydralazine for BP >180/100. At risk for rebound as holding clonidine-could consider patch.  - EKG with new T wave flattening in v4-v6, will discuss with team whether this should be further investigated.  # NEURO:  - Pain control with morphine 2mg  q2prn  - checked UDS, cocaine and marijuana positive  # Renal  - History of horseshoe kidney.  - Appears to have underlying CKD wth creatinine baseline 2 years ago 1.3-1.4 - follow BMETs to ensure stable renal function; creatinine stable at 1.56 today  # FEN:  - NS @ 125 cc/hr   # PROPHYLAXIS:  - SCDs since bleeding  - IV protonix  # DISPO:  - pending clinical improvement, and evaluation by general surgery and GI  # CODE STATUS:  - did not address with patient, full code for now  Levert Feinstein, MD [facility] Medicine PGY-1

## 2012-12-17 ENCOUNTER — Encounter (HOSPITAL_COMMUNITY): Payer: Self-pay

## 2012-12-17 ENCOUNTER — Encounter (HOSPITAL_COMMUNITY)
Admission: EM | Disposition: A | Discharge status: Home / Self Care | Payer: Self-pay | Source: Home / Self Care | Attending: Family Medicine

## 2012-12-17 HISTORY — PX: ESOPHAGOGASTRODUODENOSCOPY: SHX5428

## 2012-12-17 LAB — BASIC METABOLIC PANEL
BUN: 16 mg/dL (ref 6–23)
CO2: 20 mEq/L (ref 19–32)
Chloride: 104 mEq/L (ref 96–112)
Creatinine, Ser: 1.73 mg/dL — ABNORMAL HIGH (ref 0.50–1.10)

## 2012-12-17 LAB — CBC
HCT: 43.4 % (ref 36.0–46.0)
MCH: 26.9 pg (ref 26.0–34.0)
MCV: 83.5 fL (ref 78.0–100.0)
RBC: 5.2 MIL/uL — ABNORMAL HIGH (ref 3.87–5.11)
WBC: 17.6 10*3/uL — ABNORMAL HIGH (ref 4.0–10.5)

## 2012-12-17 SURGERY — EGD (ESOPHAGOGASTRODUODENOSCOPY)
Anesthesia: Moderate Sedation

## 2012-12-17 MED ORDER — POTASSIUM CHLORIDE 10 MEQ/100ML IV SOLN
10.0000 meq | INTRAVENOUS | Status: AC
  Administered 2012-12-17: 10 meq via INTRAVENOUS
  Administered 2012-12-17: 10 meq
  Filled 2012-12-17 (×2): qty 100

## 2012-12-17 MED ORDER — MIDAZOLAM HCL 5 MG/ML IJ SOLN
INTRAMUSCULAR | Status: AC
  Filled 2012-12-17: qty 3

## 2012-12-17 MED ORDER — FENTANYL CITRATE 0.05 MG/ML IJ SOLN
INTRAMUSCULAR | Status: AC
  Filled 2012-12-17: qty 4

## 2012-12-17 MED ORDER — BUTAMBEN-TETRACAINE-BENZOCAINE 2-2-14 % EX AERO
INHALATION_SPRAY | CUTANEOUS | Status: DC | PRN
  Administered 2012-12-17: 2 via TOPICAL

## 2012-12-17 MED ORDER — CIPROFLOXACIN IN D5W 400 MG/200ML IV SOLN
400.0000 mg | INTRAVENOUS | Status: DC
  Filled 2012-12-17: qty 200

## 2012-12-17 MED ORDER — FENTANYL CITRATE 0.05 MG/ML IJ SOLN
INTRAMUSCULAR | Status: DC | PRN
  Administered 2012-12-17 (×4): 25 ug via INTRAVENOUS

## 2012-12-17 MED ORDER — MIDAZOLAM HCL 10 MG/2ML IJ SOLN
INTRAMUSCULAR | Status: DC | PRN
  Administered 2012-12-17 (×5): 2 mg via INTRAVENOUS

## 2012-12-17 NOTE — Progress Notes (Signed)
Text page to Dr Paulina Fusi to make aware of pt having 2 bouts of nausea and emesis. Emesis was red tinged but pt had eaten/ drank cranberry juice & red icy from clear liquid tray.Pt did receive Zofran that had already been ordered and IV pain med.

## 2012-12-17 NOTE — Progress Notes (Signed)
Family Medicine Teaching Service Daily Progress Note Service Page: [phone]  Patient Assessment: Jennifer Moss is a 61 y.o. year old female presenting with abdominal pain, vomiting, rectal bleeding, and an episode of hematemesis, found to have diverticulitis on CT scan with ?microperforation.  Subjective: Could not speak with patient this morning as she was getting her EGD done, however RN reports she was feeling better and abdominal pain, n/v improved.  Objective: Temp:  [98.4 F (36.9 C)-99.2 F (37.3 C)] 98.6 F (37 C) (12/20 0435) Pulse Rate:  [71-120] 120  (12/20 0435) Resp:  [18-20] 18  (12/20 0435) BP: (106-143)/(68-91) 138/91 mmHg (12/20 0435) SpO2:  [96 %-98 %] 97 % (12/20 0435) Weight:  [132 lb 15 oz (60.3 kg)] 132 lb 15 oz (60.3 kg) (12/20 0435) Exam: Unable to examine - in EGD  I have reviewed the patient's medications, labs, imaging, and diagnostic testing.  Notable results are summarized below.  Medications:  Scheduled Meds: Cipro 400mg  IV q12h Flagyl 500mg  IV q8h Protonix 40mg  IV q12h  PRN Meds: Hydralazine 10mg  q6hprn BP > 180/100 Morphine 2mg  q2h prn  IVF: NS @ 125 cc/hr  Labs:  CBC  Lab 12/17/12 0530 12/16/12 1607 12/16/12 0522  WBC 17.6* 20.2* 22.5*  HGB 14.0 14.5 14.6  HCT 43.4 43.6 44.1  PLT 260 277 303   BMET  Lab 12/17/12 0530 12/16/12 0522 12/15/12 1618  NA 141 141 141  K 3.3* 3.5 3.9  CL 104 104 101  CO2 20 24 24   BUN 16 18 18   CREATININE 1.73* 1.56* 1.55*  GLUCOSE 71 85 161*  CALCIUM 9.1 8.9 10.2    Imaging/Diagnostic Tests:  CT Abd Pelvis with Contrast:  No evidence of bowel obstruction. Thick-walled loop of colon near the splenic flexure. Colonic diverticulosis with associated pericolonic inflammatory changes in the left upper abdomen, suggesting diverticulitis. Tiny foci of gas adjacent to the descending colon are assumed to be within a diverticula (series 11/image 12), although a localized microperforation is also possible.  No drainable fluid collection or abscess. No free air. Impression: Suspected descending colonic diverticulitis, as described above. Small area of localized microperforation is possible but is not favored. No drainable fluid collection or abscess. No free air. Crossed fused renal ectopia.  CXR: No evidence of acute cardiopulmonary disease.  EKG:  Normal sinus rhythm, with some new T wave flattening in v4-v6  Assessment/Plan:  Jennifer Moss is a 61 y.o. year old female presenting with abdominal pain, vomiting, rectal bleeding, and an episode of hematemesis, found to have diverticulitis on CT scan. Patient is currently hemodynamically stable, with systolic BP's in the 130s. Rectal bleeding likely secondary to diverticulosis. Concern for both upper GI bleed as well as lower GI bleed unless upper GI bleed is brisk, but elevated initial hgb at admission speaks against this. Patient with evidence of diverticulitiis, and most likely explanation for GI bleed is repeated vomiting. No history of alcohol use to suggest that as cause of esophageal varices. Patient with history of short term use of NSAIDS 1 month ago but will avoid NSAIDS in the meantime. Could possibly have an AVM. 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 Crack cocaine inhalation could place her at risk for hemoptysis as well.   # GI: diverticulitis, rectal bleeding, and hematemesis. - GI following, appreciate their recs. EGD today - will f/u results. - Per surgery - no urgent surgical need at this time. -  NPO for now, will start clears after EGD - IV protonix - Treat diverticulitis with IV cipro & flagyl, plan to change to PO tomorrow if still doing well - follow abdominal exam closely  - If becomes hemodynamically unstable or exam worsens, will consider transfer to stepdown and consider changing abx to Zosyn - Zofran 4mg  q6h prn nausea   # HEME/ID:   - CBC's to  follow leukocytosis and hemoglobin - Hgb stable at 14 this morning, WBC improving - IV cipro & flagyl as above   # CARDIOVASCULAR: hypertensive at baseline  - holding home antihypertensives now while NPO  - prn IV hydralazine for BP >180/100. At risk for rebound as holding clonidine-could consider patch.  - EKG with new T wave flattening in v4-v6, no further workup at this time per Dr. Konrad Dolores  # NEURO:  - Pain control with morphine 2mg  q2prn  - checked UDS, cocaine and marijuana positive  # Renal  - History of horseshoe kidney.  - Appears to have underlying CKD wth creatinine baseline 2 years ago 1.3-1.4 - follow BMETs to ensure stable renal function; creatinine up slightly at 1.73 today  # FEN:  - NS @ 125 cc/hr  - give 10 mEq KCl via IV x2 today as slightly hypokalemic at 3.3 - attempt clears this afternoon after EGD  # PROPHYLAXIS:  - SCDs since bleeding  - IV protonix  # DISPO:  - pending clinical improvement, results of EGD, transition to PO antibiotics, and ability to tolerate food  # CODE STATUS:  - did not address with patient, full code for now  Jennifer Feinstein, MD [facility] Medicine PGY-1

## 2012-12-17 NOTE — Progress Notes (Signed)
S:  Patient about to go down for EGD.  Still with some pain but vastly improved from admission.  Passing flatus.  O: Gen:  Patient sitting in wheelchair, awake and alert, NAD HEENT: MMM Cardiac:  Regular rate and rhythm without murmur auscultated.  Good S1/S2. Pulm:  Clear to auscultation bilaterally with good air movement.  No wheezes or rales noted.   Abdomen:  Mildly TTP LLQ, good BS, no guarding or rebound. Ext:  No edema  Imp/Plan: 1.  Diverticulitis: - on IV cipro/flagyl currently, continue - afebrile with WBC trending down - will restart PO liquids after endoscopy - Appreciate surgery rec's  2.  Hematemesis: - EGD today to explore - no further episodes.  Vitals have been good past 24 hours. - Hgb stable  - Appreciate GI rec's  Please see resident note for chronic medical issues.

## 2012-12-17 NOTE — Progress Notes (Signed)
FMTS Attending Daily Note:  Renold Don MD  [phone] pager  Family Practice pager:  [phone] I have seen and examined this patient and have reviewed their chart. I have discussed this patient with the resident. I agree with the resident's findings, assessment and care plan.  Please see my note for details.

## 2012-12-17 NOTE — Progress Notes (Signed)
UR Chart Review Completed  

## 2012-12-17 NOTE — Op Note (Signed)
[facility] [address] [place], [address]   ENDOSCOPY PROCEDURE REPORT  PATIENT: [name], [name]  MR#: [id number] BIRTHDATE: 1951-08-05 , 61  yrs. old GENDER: Female ENDOSCOPIST: Wandalee Ferdinand, MD REFERRED BY: PROCEDURE DATE:  12/17/2012 PROCEDURE:   EGD ASA CLASS: 3 INDICATIONS: hematemesis MEDICATIONS:  fentanyl 100 mcg IV, Versed10 mg IV TOPICAL ANESTHETIC:  DESCRIPTION OF PROCEDURE:   After the risks benefits and alternatives of the procedure were thoroughly explained, informed consent was obtained.  The Pentax       endoscope was introduced through the mouth and advanced to the second portion of the duodenum      , limited by Without limitations.   The instrument was slowly withdrawn as the mucosa was fully examined.      FINDINGS:  Esophagus: At the esophagogastric junction there was a linear ulcer with exudate on it. The rest of the esophagus looked normal. This ulcer at the esophagogastric junction with exudate on it is probably from a Mallory-Weiss tear. There is no evidence of bleeding now.  Stomach: Normal  Duodenum: Normal    COMPLICATIONS:none  ENDOSCOPIC IMPRESSION:see above   RECOMMENDATIONS:PPI therapy      _______________________________ Rosalie DoctorWandalee Ferdinand, MD 12/17/2012 12:05 PM       PATIENT NAME:  Jennifer Moss, Jennifer Moss MR#: [id number]

## 2012-12-17 NOTE — Progress Notes (Signed)
Subjective: Pain less today Going to endo this morning for EGD +flatus  Objective: Vital signs in last 24 hours: Temp:  [98.4 F (36.9 C)-99.2 F (37.3 C)] 98.6 F (37 C) (12/20 0435) Pulse Rate:  [71-120] 120  (12/20 0435) Resp:  [18-20] 18  (12/20 0435) BP: (106-143)/(68-91) 138/91 mmHg (12/20 0435) SpO2:  [96 %-98 %] 97 % (12/20 0435) Weight:  [132 lb 15 oz (60.3 kg)] 132 lb 15 oz (60.3 kg) (12/20 0435) Last BM Date: 12/16/12  Intake/Output from previous day: 12/19 0701 - 12/20 0700 In: 1027 [I.V.:317; IV Piggyback:710] Out: 1425 [Urine:1425] Intake/Output this shift:    Abdomen soft, non distended, mild tenderness with guarding in LLQ  Lab Results:   Basename 12/17/12 0530 12/16/12 1607  WBC 17.6* 20.2*  HGB 14.0 14.5  HCT 43.4 43.6  PLT 260 277   BMET  Basename 12/17/12 0530 12/16/12 0522  NA 141 141  K 3.3* 3.5  CL 104 104  CO2 20 24  GLUCOSE 71 85  BUN 16 18  CREATININE 1.73* 1.56*  CALCIUM 9.1 8.9   PT/INR No results found for this basename: LABPROT:2,INR:2 in the last 72 hours ABG No results found for this basename: PHART:2,PCO2:2,PO2:2,HCO3:2 in the last 72 hours  Studies/Results: Dg Chest 2 View  12/16/2012  *RADIOLOGY REPORT*  Clinical Data: Hemoptysis, nausea/vomiting  CHEST - 2 VIEW  Comparison: 11/05/2012  Findings: Lungs are clear. No pleural effusion or pneumothorax.  Cardiomediastinal silhouette is within normal limits.  Degenerative changes of the visualized thoracolumbar spine.  IMPRESSION: No evidence of acute cardiopulmonary disease.   Original Report Authenticated By: Charline Bills, M.D.    Ct Abdomen Pelvis W Contrast  12/15/2012  *RADIOLOGY REPORT*  Clinical Data: Abdominal pain, nausea/vomiting, leukocytosis, rectal bleeding  CT ABDOMEN AND PELVIS WITH CONTRAST  Technique:  Multidetector CT imaging of the abdomen and pelvis was performed following the standard protocol during bolus administration of intravenous contrast.   Contrast: OMNIPAQUE IOHEXOL 300 MG/ML  SOLN  Comparison: MRI abdomen dated 11/10/2008.  Findings: Motion degraded images due to patient vomiting during imaging.  Mild linear scarring versus atelectasis in the lateral left lung base.  Two stable cysts in the right hepatic lobe (series 11/images 12 and 16).  Spleen, pancreas, and adrenal glands are within normal limits.  Gallbladder is unremarkable.  No intrahepatic or extrahepatic ductal dilatation.  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 No hydronephrosis.  No evidence of bowel obstruction.  Thick-walled loop of colon near the splenic flexure (series 12/image 10).  Colonic diverticulosis with associated pericolonic inflammatory changes in the left upper abdomen (series 12/image 43), suggesting diverticulitis.  Tiny foci of gas adjacent to the descending colon are assumed to be within Moss diverticula (series 11/image 12), although Moss localized microperforation is also possible.  No drainable fluid collection or abscess.  No free air.  No evidence of abdominal aortic aneurysm.  No abdominopelvic ascites.  No suspicious abdominopelvic lymphadenopathy.  Uterus and bilateral ovaries are unremarkable.  Bladder is within normal limits.  Degenerative changes of the visualized thoracolumbar spine.  IMPRESSION:  Suspected descending colonic diverticulitis, as described above. Small area of localized microperforation is possible but is not favored.  No drainable fluid collection or abscess.  No free air.  Crossed fused renal ectopia.   Original Report Authenticated By: Charline Bills, M.D.     Anti-infectives: Anti-infectives     Start     Dose/Rate Route Frequency Ordered Stop  12/16/12 0046   ciprofloxacin (CIPRO) IVPB 400 mg        400 mg 200 mL/hr over 60 Minutes Intravenous Every 12 hours 12/16/12 0046     12/16/12 0046   metroNIDAZOLE (FLAGYL) IVPB 500 mg        500 mg 100 mL/hr over 60 Minutes Intravenous  Every 8 hours 12/16/12 0046            Assessment/Plan: s/p Procedure(s) (LRB) with comments: ESOPHAGOGASTRODUODENOSCOPY (EGD) (N/Moss)  Diverticulitis with micro perf slowly improving, WBC trending down Ok from gen surg standpoint to start clear liquids after endo  LOS: 2 days    Jennifer Moss 12/17/2012

## 2012-12-18 LAB — CBC
HCT: 42.8 % (ref 36.0–46.0)
MCH: 27.3 pg (ref 26.0–34.0)
MCHC: 32.5 g/dL (ref 30.0–36.0)
MCV: 83.9 fL (ref 78.0–100.0)
RDW: 13.6 % (ref 11.5–15.5)

## 2012-12-18 LAB — BASIC METABOLIC PANEL
BUN: 12 mg/dL (ref 6–23)
Calcium: 8.8 mg/dL (ref 8.4–10.5)
Creatinine, Ser: 1.41 mg/dL — ABNORMAL HIGH (ref 0.50–1.10)
GFR calc Af Amer: 46 mL/min — ABNORMAL LOW (ref >90)
GFR calc non Af Amer: 40 mL/min — ABNORMAL LOW (ref >90)

## 2012-12-18 MED ORDER — CIPROFLOXACIN HCL 500 MG PO TABS
500.0000 mg | ORAL_TABLET | Freq: Two times a day (BID) | ORAL | Status: DC
  Administered 2012-12-18 – 2012-12-22 (×9): 500 mg via ORAL
  Filled 2012-12-18 (×12): qty 1

## 2012-12-18 MED ORDER — LISINOPRIL 20 MG PO TABS
20.0000 mg | ORAL_TABLET | Freq: Every day | ORAL | Status: DC
  Administered 2012-12-18 – 2012-12-22 (×5): 20 mg via ORAL
  Filled 2012-12-18 (×5): qty 1

## 2012-12-18 MED ORDER — METRONIDAZOLE 500 MG PO TABS
500.0000 mg | ORAL_TABLET | Freq: Three times a day (TID) | ORAL | Status: DC
  Administered 2012-12-18 – 2012-12-22 (×12): 500 mg via ORAL
  Filled 2012-12-18 (×14): qty 1

## 2012-12-18 MED ORDER — AMLODIPINE BESYLATE 10 MG PO TABS
10.0000 mg | ORAL_TABLET | Freq: Every day | ORAL | Status: DC
  Administered 2012-12-18 – 2012-12-22 (×5): 10 mg via ORAL
  Filled 2012-12-18 (×5): qty 1

## 2012-12-18 NOTE — Progress Notes (Signed)
Patient ID: Jennifer Moss, female   DOB: [birth year], 61 y.o.   MRN: [id number] 1 Day Post-Op  Subjective: Feels better, some L side abd pain last night but denies pain this AM Vomited CL last night but tolerating them OK this morning  Objective: Vital signs in last 24 hours: Temp:  [98.4 F (36.9 C)-99.4 F (37.4 C)] 98.6 F (37 C) (12/21 0624) Pulse Rate:  [79-110] 79  (12/21 0624) Resp:  [13-24] 18  (12/21 0624) BP: (122-170)/(72-107) 129/86 mmHg (12/21 0624) SpO2:  [93 %-100 %] 99 % (12/21 0624) Weight:  [133 lb 4.8 oz (60.464 kg)] 133 lb 4.8 oz (60.464 kg) (12/21 0624) Last BM Date: 12/16/12  Intake/Output from previous day: 12/20 0701 - 12/21 0700 In: 4064 [P.O.:910; I.V.:2650; IV Piggyback:504] Out: 1550 [Urine:1550] Intake/Output this shift:    General appearance: alert, cooperative and no distress GI: Soft with very minimal L side abd tenderness, no guarding  Lab Results:   Basename 12/18/12 0438 12/17/12 0530  WBC 13.3* 17.6*  HGB 13.9 14.0  HCT 42.8 43.4  PLT 271 260   BMET  Basename 12/17/12 0530 12/16/12 0522  NA 141 141  K 3.3* 3.5  CL 104 104  CO2 20 24  GLUCOSE 71 85  BUN 16 18  CREATININE 1.73* 1.56*  CALCIUM 9.1 8.9     Studies/Results: No results found.  Anti-infectives: Anti-infectives     Start     Dose/Rate Route Frequency Ordered Stop   12/18/12 2200   ciprofloxacin (CIPRO) IVPB 400 mg        400 mg 200 mL/hr over 60 Minutes Intravenous Every 24 hours 12/17/12 1302     12/16/12 0046   ciprofloxacin (CIPRO) IVPB 400 mg  Status:  Discontinued        400 mg 200 mL/hr over 60 Minutes Intravenous Every 12 hours 12/16/12 0046 12/17/12 1302   12/16/12 0046   metroNIDAZOLE (FLAGYL) IVPB 500 mg        500 mg 100 mL/hr over 60 Minutes Intravenous Every 8 hours 12/16/12 0046            Assessment/Plan:  Diverticulitis, improving steadily on abx ESOPHAGOGASTRODUODENOSCOPY showed M-W tear Cont abx, CL diet    LOS: 3 days     Jennifer Moss T 12/18/2012

## 2012-12-18 NOTE — Progress Notes (Signed)
Subjective: LLQ pain improving. Tolerating clear liquid diet.  Objective: Vital signs in last 24 hours: Temp:  [98.6 F (37 C)-99.4 F (37.4 C)] 98.6 F (37 C) (12/21 0624) Pulse Rate:  [76-110] 76  (12/21 1157) Resp:  [18-20] 18  (12/21 0624) BP: (123-169)/(86-101) 160/91 mmHg (12/21 1157) SpO2:  [99 %] 99 % (12/21 0624) Weight:  [60.464 kg (133 lb 4.8 oz)] 60.464 kg (133 lb 4.8 oz) (12/21 0624) Weight change: 0.164 kg (5.8 oz) Last BM Date: 12/16/12  PE: GEN:  NAD ABD:  LLQ tenderness (improving per patient); mild guarding no peritonitis; bowel sounds present.  Lab Results: CBC    Component Value Date/Time   WBC 13.3* 12/18/2012 0438   RBC 5.10 12/18/2012 0438   HGB 13.9 12/18/2012 0438   HCT 42.8 12/18/2012 0438   PLT 271 12/18/2012 0438   MCV 83.9 12/18/2012 0438   MCH 27.3 12/18/2012 0438   MCHC 32.5 12/18/2012 0438   RDW 13.6 12/18/2012 0438   LYMPHSABS 0.5* 12/15/2012 1618   MONOABS 0.5 12/15/2012 1618   EOSABS 0.0 12/15/2012 1618   BASOSABS 0.0 12/15/2012 1618   Assessment:  1.  Diverticulitis with microperforation, improving with medical management. 2.  Hematemesis, resolved, endoscopy yesterday showing healing Mallory-Weiss Tear.  Plan:  1.  Continue antibiotics. 2.  PPI daily for next 6 weeks. 3.  Will arrange outpatient follow-up with Dr. Evette Cristal; patient has never had a colonoscopy and will likely benefit electively from one sometime in the next few months (pending resolution of her current symptoms). 4.  Will sign-off; defer to surgery for diet advancement, disposition planning, etc.   Freddy Jaksch 12/18/2012, 1:08 PM

## 2012-12-18 NOTE — Progress Notes (Signed)
Family Medicine Teaching Service Daily Progress Note Service Page: [phone]  Patient Assessment: Jennifer Moss is a 61 y.o. year old female presenting with abdominal pain, rectal bleeding, and an episode of hematemesis, found to have diverticulitis on CT scan with ?microperforation. EGD 12/20 shows small mallory-weiss tears.  Subjective: feels improved this am. Tolerating clears well. Only a slight twinge of pain.   Objective: Temp:  [98.4 F (36.9 C)-99.4 F (37.4 C)] 98.6 F (37 C) (12/21 0624) Pulse Rate:  [79-110] 79  (12/21 0624) Resp:  [13-24] 18  (12/21 0624) BP: (122-170)/(72-107) 129/86 mmHg (12/21 0624) SpO2:  [93 %-100 %] 99 % (12/21 0624) Weight:  [133 lb 4.8 oz (60.464 kg)] 133 lb 4.8 oz (60.464 kg) (12/21 0624) Exam: GEN: nad, sitting at bedside HEENT: PERRLA, MMM CV: RRR, no murmurs. Pulm: CTAB, nonlabored. ABD: soft, NTND, BS +, no guarding EXT: No edema. Neuro: A/Ox3, gross motor intact.  I have reviewed the patient's medications, labs, imaging, and diagnostic testing.  Notable results are summarized below.  Medications:    . ciprofloxacin  400 mg Intravenous Q24H  . metroNIDAZOLE  500 mg Intravenous Q8H  . pantoprazole (PROTONIX) IV  40 mg Intravenous Q12H  . sodium chloride  3 mL Intravenous Q12H   PRN Meds: Hydralazine 10mg  q6hprn BP > 180/100 Morphine 2mg  q2h prn  IVF: NS @ 125 cc/hr  Labs:  CBC  Lab 12/18/12 0438 12/17/12 0530 12/16/12 1607  WBC 13.3* 17.6* 20.2*  HGB 13.9 14.0 14.5  HCT 42.8 43.4 43.6  PLT 271 260 277   BMET  Lab 12/17/12 0530 12/16/12 0522 12/15/12 1618  NA 141 141 141  K 3.3* 3.5 3.9  CL 104 104 101  CO2 20 24 24   BUN 16 18 18   CREATININE 1.73* 1.56* 1.55*  GLUCOSE 71 85 161*  CALCIUM 9.1 8.9 10.2    Imaging/Diagnostic Tests:  CT Abd Pelvis with Contrast:  No evidence of bowel obstruction. Thick-walled loop of colon near the splenic flexure. Colonic diverticulosis with associated pericolonic inflammatory  changes in the left upper abdomen, suggesting diverticulitis. Tiny foci of gas adjacent to the descending colon are assumed to be within a diverticula (series 11/image 12), although a localized microperforation is also possible. No drainable fluid collection or abscess. No free air. Impression: Suspected descending colonic diverticulitis, as described above. Small area of localized microperforation is possible but is not favored. No drainable fluid collection or abscess. No free air. Crossed fused renal ectopia.  CXR: No evidence of acute cardiopulmonary disease.  EKG:  Normal sinus rhythm, with some new T wave flattening in v4-v6  Assessment/Plan:  Jennifer Moss is a 61 y.o. year old female presenting with abdominal pain, rectal bleeding, and hematemesis, found to have diverticulitis on CT scan. EGD 12/20 showing mild mallory weiss tears.  # GI: diverticulitis, rectal bleeding, small non-bleeding Mallory weiss tear. - Appreciate GI evaluation and recs. EGD report reviewed. Will continue PPI therapy. - Per surgery - no urgent surgical need at this time. - Advance diet from clears as tolerated - IV protonix-change to PO as tolerated. - Treat diverticulitis with IV cipro & flagyl, plan to change to PO hopefully today. - follow abdominal exam closely  - Zofran 4mg  q6h prn nausea   # HEME/ID:   - leukocytosis improved. - Hgb stable  - IV cipro & flagyl as above   # CARDIOVASCULAR: hypertensive at baseline  - holding home antihypertensives now while NPO (clonidine, norvasc, lisinopril)-will restart norvasc and  lisinopril today. - prn IV hydralazine for BP >180/100.  - EKG with new T wave flattening in v4-v6  # NEURO:  - Pain control with morphine 2mg  q2prn-change to oral today if improved.  - UDS, cocaine and marijuana positive  # Renal  - History of horseshoe kidney.  - Appears to have underlying CKD wth creatinine baseline 2 years ago 1.3-1.4 -  Improved cr to 1.4 -restart ACEi  today.  # FEN:  - NS @ 125 cc/hr  -repleated IV 10 mEq KCl via IV for 3., recheck today. - ADAT  # PROPHYLAXIS:  - SCDs since bleeding  - IV protonix-to oral this pm if tolerated.  # DISPO:  - pending clinical improvement, transition to orals, and ability to tolerate food  # CODE STATUS:  - full code  Lloyd Huger, MD [facility] Resident - PGY-3 12/18/2012 8:33 AM

## 2012-12-18 NOTE — Progress Notes (Signed)
FMTS Attending Daily Note:  Renold Don MD  [phone] pager  Family Practice pager:  [phone] I have seen and examined this patient and have reviewed their chart. I have discussed this patient with the resident. I agree with the resident's findings, assessment and care plan.  Slowly improving.  Will see how she tolerates full liquids as she has tolerated clears.  Continue IV antibiotics until sure she is tolerating po.   No further hematemesis.   Appreciate GI/surgery rec's.

## 2012-12-19 MED ORDER — PANTOPRAZOLE SODIUM 40 MG PO TBEC
40.0000 mg | DELAYED_RELEASE_TABLET | Freq: Every day | ORAL | Status: DC
  Administered 2012-12-19 – 2012-12-22 (×4): 40 mg via ORAL
  Filled 2012-12-19 (×3): qty 1

## 2012-12-19 NOTE — Progress Notes (Signed)
Patient ID: Jennifer Moss, female   DOB: 1951-04-14, 61 y.o.   MRN: [id number] 2 Days Post-Op  Subjective: No pain this morning. She is transitioning to a solid diet. Has not had a bowel movement.  Objective: Vital signs in last 24 hours: Temp:  [98.5 F (36.9 C)-98.9 F (37.2 C)] 98.7 F (37.1 C) (12/22 0506) Pulse Rate:  [76-113] 113  (12/22 0506) Resp:  [18-20] 18  (12/22 0506) BP: (126-160)/(85-99) 145/85 mmHg (12/22 0928) SpO2:  [99 %-100 %] 99 % (12/22 0506) Weight:  [132 lb 11.2 oz (60.192 kg)] 132 lb 11.2 oz (60.192 kg) (12/22 0506) Last BM Date: 12/16/12  Intake/Output from previous day: 12/21 0701 - 12/22 0700 In: 4172.8 [P.O.:1020; I.V.:2938.8; IV Piggyback:214] Out: 2400 [Urine:2400] Intake/Output this shift: Total I/O In: 3 [I.V.:3] Out: -   General appearance: alert, cooperative and no distress GI: normal findings: soft, non-tender  Lab Results:   Basename 12/18/12 0438 12/17/12 0530  WBC 13.3* 17.6*  HGB 13.9 14.0  HCT 42.8 43.4  PLT 271 260   BMET  Basename 12/18/12 0852 12/17/12 0530  NA 141 141  K 4.2 3.3*  CL 106 104  CO2 17* 20  GLUCOSE 137* 71  BUN 12 16  CREATININE 1.41* 1.73*  CALCIUM 8.8 9.1     Studies/Results: No results found.  Anti-infectives: Anti-infectives     Start     Dose/Rate Route Frequency Ordered Stop   12/18/12 2200   ciprofloxacin (CIPRO) IVPB 400 mg  Status:  Discontinued        400 mg 200 mL/hr over 60 Minutes Intravenous Every 24 hours 12/17/12 1302 12/18/12 1056   12/18/12 2200   metroNIDAZOLE (FLAGYL) tablet 500 mg        500 mg Oral 3 times per day 12/18/12 2040 12/28/12 2159   12/18/12 1200   ciprofloxacin (CIPRO) tablet 500 mg        500 mg Oral 2 times daily 12/18/12 1056     12/16/12 0046   ciprofloxacin (CIPRO) IVPB 400 mg  Status:  Discontinued        400 mg 200 mL/hr over 60 Minutes Intravenous Every 12 hours 12/16/12 0046 12/17/12 1302   12/16/12 0046   metroNIDAZOLE (FLAGYL) IVPB 500 mg   Status:  Discontinued        500 mg 100 mL/hr over 60 Minutes Intravenous Every 8 hours 12/16/12 0046 12/18/12 2035          Assessment/Plan: Sigmoid diverticulitis with microperforation. Improving steadily on antibiotics. If she tolerates her diet well I believe she could be discharged tomorrow on oral antibiotics and followup. s/p Procedure(s): ESOPHAGOGASTRODUODENOSCOPY (EGD)-Mallory-Weiss tear, no further bleeding    LOS: 4 days    Jennifer Moss T 12/19/2012

## 2012-12-19 NOTE — Progress Notes (Signed)
FMTS Attending Daily Note:  Renold Don MD  [phone] pager  Family Practice pager:  [phone] I have seen and examined this patient and have reviewed their chart. I have discussed this patient with the resident. I agree with the resident's findings, assessment and care plan.  Patient much improved.  Surgery recommends watching how she tolerates diet with DC tomorrow AM if doing well.  Minimal tenderness on exam.  Minimal flatus, no BM.   Plan: - Can DC tele. Margaretha Glassing IVF - Likely home tomorrow

## 2012-12-19 NOTE — Progress Notes (Signed)
Family Medicine Teaching Service Daily Progress Note Service Page: [phone]  Patient Assessment: Jennifer Moss is a 61 y.o. year old female presenting with abdominal pain, rectal bleeding, and an episode of hematemesis, found to have diverticulitis on CT scan with ?microperforation. EGD 12/20 shows small mallory-weiss tears.  Subjective: Continues to feel well. Has not had a BM yet. Wants to try to eat solid foods today.  Objective: Temp:  [98.5 F (36.9 C)-98.9 F (37.2 C)] 98.7 F (37.1 C) (12/22 0506) Pulse Rate:  [76-113] 113  (12/22 0506) Resp:  [18-20] 18  (12/22 0506) BP: (126-160)/(87-99) 126/87 mmHg (12/22 0506) SpO2:  [99 %-100 %] 99 % (12/22 0506) Weight:  [132 lb 11.2 oz (60.192 kg)] 132 lb 11.2 oz (60.192 kg) (12/22 0506) Exam: GEN: nad, sitting on bedside toilet HEENT: normocephalic CV: RRR, no murmurs. Pulm: CTAB, nonlabored. ABD: soft, NTND, BS +, no guarding EXT: No edema. Neuro: A/Ox3, gross motor intact.  I have reviewed the patient's medications, labs, imaging, and diagnostic testing.  Notable results are summarized below.  Medications:  Scheduled Meds: Amlodipine 10mg  daily Cipro 500mg  PO BID Lisinopril 20mg  daily Flagyl 500mg  PO q8h Protonix 40mg  IV BID  PRN Meds: Hydralazine 10mg  q6hprn BP > 180/100 Zofran prn  IVF: NS @ 50 cc/hr  Labs:  CBC  Lab 12/18/12 0438 12/17/12 0530 12/16/12 1607  WBC 13.3* 17.6* 20.2*  HGB 13.9 14.0 14.5  HCT 42.8 43.4 43.6  PLT 271 260 277   BMET  Lab 12/18/12 0852 12/17/12 0530 12/16/12 0522  NA 141 141 141  K 4.2 3.3* 3.5  CL 106 104 104  CO2 17* 20 24  BUN 12 16 18   CREATININE 1.41* 1.73* 1.56*  GLUCOSE 137* 71 85  CALCIUM 8.8 9.1 8.9    Imaging/Diagnostic Tests:  CT Abd Pelvis with Contrast:  No evidence of bowel obstruction. Thick-walled loop of colon near the splenic flexure. Colonic diverticulosis with associated pericolonic inflammatory changes in the left upper abdomen, suggesting  diverticulitis. Tiny foci of gas adjacent to the descending colon are assumed to be within a diverticula (series 11/image 12), although a localized microperforation is also possible. No drainable fluid collection or abscess. No free air. Impression: Suspected descending colonic diverticulitis, as described above. Small area of localized microperforation is possible but is not favored. No drainable fluid collection or abscess. No free air. Crossed fused renal ectopia.  CXR: No evidence of acute cardiopulmonary disease.  EKG:  Normal sinus rhythm, with some new T wave flattening in v4-v6  EGD: Mallory-Weiss tear  Assessment/Plan:  Jennifer Moss is a 61 y.o. year old female presenting with abdominal pain, rectal bleeding, and hematemesis, found to have diverticulitis on CT scan. EGD 12/20 showing mild mallory weiss tears.  # GI: diverticulitis, rectal bleeding, small non-bleeding Mallory weiss tear. - Appreciate GI evaluation and recs. EGD report reviewed. Will continue PPI therapy. - Per surgery - no urgent surgical need at this time. - Advance diet from clears as tolerated - IV protonix-change to PO today - Treating diverticulitis with PO cipro & flagyl. - follow abdominal exam closely  - Zofran 4mg  q6h prn nausea   # HEME/ID:   - leukocytosis improved. - Hgb stable  - IV cipro & flagyl as above   # CARDIOVASCULAR: hypertensive at baseline  - continue norvasc & lisinopril, restart clonidine today - prn IV hydralazine for BP >180/100.  - EKG with new T wave flattening in v4-v6  # NEURO:  - UDS, cocaine  and marijuana positive  # Renal  - History of horseshoe kidney  - Appears to have underlying CKD wth creatinine baseline 2 years ago 1.3-1.4 - Improved cr to 1.41 yesterday - ACE restarted yesterday  # FEN:  - NS @ 125 cc/hr  - ADAT  # PROPHYLAXIS:  - SCDs since bleeding  - protonix to PO today  # DISPO:  - pending clinical improvement and ability to tolerate food,  likely d/c later today  # CODE STATUS:  - full code  Levert Feinstein, MD Family Medicine PGY-1

## 2012-12-19 NOTE — Progress Notes (Signed)
Cardiac Monitors were down from 0800-1000. Patient was in NSR before and when the monitors rebooted, patient remain in NSR. No complaints and no signs or symptoms of distress or discomfort during this period. 

## 2012-12-20 ENCOUNTER — Encounter (HOSPITAL_COMMUNITY): Payer: Self-pay | Admitting: Gastroenterology

## 2012-12-20 MED ORDER — ACETAMINOPHEN 325 MG PO TABS
650.0000 mg | ORAL_TABLET | Freq: Four times a day (QID) | ORAL | Status: DC | PRN
  Administered 2012-12-20: 650 mg via ORAL
  Filled 2012-12-20: qty 2

## 2012-12-20 MED ORDER — GLYCERIN (LAXATIVE) 2.1 G RE SUPP
1.0000 | Freq: Once | RECTAL | Status: AC
  Administered 2012-12-20: 1 via RECTAL
  Filled 2012-12-20 (×2): qty 1

## 2012-12-20 NOTE — Progress Notes (Signed)
Patient ID: Jennifer Moss, female   DOB: January 12, 1951, 61 y.o.   MRN: [id number] 3 Days Post-Op  Subjective: No pain this morning. She is transitioning to a solid diet. Has not had a bowel movement. Has had some nausea with her meals this am. Objective: Vital signs in last 24 hours: Temp:  [98.4 F (36.9 C)-99.1 F (37.3 C)] 98.4 F (36.9 C) (12/23 0514) Pulse Rate:  [90-106] 96  (12/23 0514) Resp:  [18-19] 19  (12/23 0514) BP: (120-145)/(85-89) 120/89 mmHg (12/23 0514) SpO2:  [96 %-100 %] 96 % (12/23 0514) Weight:  [131 lb 1.6 oz (59.467 kg)] 131 lb 1.6 oz (59.467 kg) (12/23 0514) Last BM Date: 12/16/12  Intake/Output from previous day: 12/22 0701 - 12/23 0700 In: 1152.2 [P.O.:582; I.V.:570.2] Out: 1350 [Urine:1350] Intake/Output this shift:    General appearance: alert, cooperative and no distress GI: normal findings: soft, non-tender  Lab Results:   [facility] 12/18/12 0438  WBC 13.3*  HGB 13.9  HCT 42.8  PLT 271   BMET  Basename 12/18/12 0852  NA 141  K 4.2  CL 106  CO2 17*  GLUCOSE 137*  BUN 12  CREATININE 1.41*  CALCIUM 8.8     Studies/Results: No results found.  Anti-infectives: Anti-infectives     Start     Dose/Rate Route Frequency Ordered Stop   12/18/12 2200   ciprofloxacin (CIPRO) IVPB 400 mg  Status:  Discontinued        400 mg 200 mL/hr over 60 Minutes Intravenous Every 24 hours 12/17/12 1302 12/18/12 1056   12/18/12 2200   metroNIDAZOLE (FLAGYL) tablet 500 mg        500 mg Oral 3 times per day 12/18/12 2040 12/28/12 2159   12/18/12 1200   ciprofloxacin (CIPRO) tablet 500 mg        500 mg Oral 2 times daily 12/18/12 1056     12/16/12 0046   ciprofloxacin (CIPRO) IVPB 400 mg  Status:  Discontinued        400 mg 200 mL/hr over 60 Minutes Intravenous Every 12 hours 12/16/12 0046 12/17/12 1302   12/16/12 0046   metroNIDAZOLE (FLAGYL) IVPB 500 mg  Status:  Discontinued        500 mg 100 mL/hr over 60 Minutes Intravenous Every 8 hours  12/16/12 0046 12/18/12 2035          Assessment/Plan: Sigmoid diverticulitis with microperforation. Improving steadily on antibiotics. (Cipro/Flagyl for an additional 10 days) If she tolerates her diet today w/o further nausea; l I believe she could be discharged today on oral antibiotics and follow up with [place] on an outpatient basis. s/p Procedure(s): ESOPHAGOGASTRODUODENOSCOPY (EGD)-Mallory-Weiss tear, no further bleeding    LOS: 5 days    [name] [facility] Pager [phone]  12/20/2012

## 2012-12-20 NOTE — Progress Notes (Signed)
Family Medicine Teaching Service Daily Progress Note Service Page: [phone]  Patient Assessment: Jennifer Moss is a 61 y.o. year old female presenting with abdominal pain, rectal bleeding, and an episode of hematemesis, found to have diverticulitis on CT scan with ?microperforation. EGD 12/20 shows small mallory-weiss tears.  Subjective: Had some nausea this morning. Threw up yesterday. Is having some pain in her stomach, which she says is like gas pains.  Objective: Temp:  [98.4 F (36.9 C)-99.1 F (37.3 C)] 98.4 F (36.9 C) (12/23 0514) Pulse Rate:  [90-106] 96  (12/23 0514) Resp:  [18-19] 19  (12/23 0514) BP: (120-145)/(85-89) 120/89 mmHg (12/23 0514) SpO2:  [96 %-100 %] 96 % (12/23 0514) Weight:  [131 lb 1.6 oz (59.467 kg)] 131 lb 1.6 oz (59.467 kg) (12/23 0514) Exam: GEN: NAD, appears uncomfortable HEENT: Normocephalic CV: RRR, no murmurs Pulm: CTAB, nonlabored ABD: soft, NTND, BS +, no guarding EXT: Nontender to palpation in lower extremities, brisk capillary refill Neuro: nonfocal, grossly intact  I have reviewed the patient's medications, labs, imaging, and diagnostic testing.  Notable results are summarized below.  Medications:  Scheduled Meds: Amlodipine 10mg  daily Cipro 500mg  PO BID Lisinopril 20mg  daily Flagyl 500mg  PO q8h Protonix 40mg  PO daily  PRN Meds: Hydralazine 10mg  q6hprn BP > 180/100 Zofran prn  IVF: NS @ KVO  Labs:  CBC  Lab 12/18/12 0438 12/17/12 0530 12/16/12 1607  WBC 13.3* 17.6* 20.2*  HGB 13.9 14.0 14.5  HCT 42.8 43.4 43.6  PLT 271 260 277   BMET  Lab 12/18/12 0852 12/17/12 0530 12/16/12 0522  NA 141 141 141  K 4.2 3.3* 3.5  CL 106 104 104  CO2 17* 20 24  BUN 12 16 18   CREATININE 1.41* 1.73* 1.56*  GLUCOSE 137* 71 85  CALCIUM 8.8 9.1 8.9    Imaging/Diagnostic Tests:  CT Abd Pelvis with Contrast:  No evidence of bowel obstruction. Thick-walled loop of colon near the splenic flexure. Colonic diverticulosis with  associated pericolonic inflammatory changes in the left upper abdomen, suggesting diverticulitis. Tiny foci of gas adjacent to the descending colon are assumed to be within a diverticula (series 11/image 12), although a localized microperforation is also possible. No drainable fluid collection or abscess. No free air. Impression: Suspected descending colonic diverticulitis, as described above. Small area of localized microperforation is possible but is not favored. No drainable fluid collection or abscess. No free air. Crossed fused renal ectopia.  CXR: No evidence of acute cardiopulmonary disease.  EKG:  Normal sinus rhythm, with some new T wave flattening in v4-v6  EGD: Mallory-Weiss tear  Assessment/Plan:  Jennifer Moss is a 61 y.o. year old female presenting with abdominal pain, rectal bleeding, and hematemesis, found to have diverticulitis on CT scan. EGD 12/20 showing mild mallory weiss tears.  # GI: diverticulitis, rectal bleeding, small non-bleeding Mallory weiss tear. - Appreciate GI evaluation and recs. EGD report reviewed. Will continue PPI therapy. - Per surgery - no urgent surgical need at this time. - Advance diet from full liquids as tolerated - Treating diverticulitis with PO cipro & flagyl. - follow abdominal exam closely  - still some nausea today, continue Zofran 4mg  q6h prn nausea  - tylenol 650mg  q6h prn pain  # HEME/ID:   - leukocytosis improved as of 12/21, Hgb stable as of 12/21 - recheck CBC prior to d/c - IV cipro & flagyl as above   # CARDIOVASCULAR: hypertensive at baseline  - continue norvasc & lisinopril - prn IV hydralazine  for BP >180/100.  - EKG with new T wave flattening in v4-v6  # NEURO:  - UDS, cocaine and marijuana positive  # Renal  - History of horseshoe kidney  - Appears to have underlying CKD wth creatinine baseline 2 years ago 1.3-1.4 - Improved cr to 1.41 on 12/21 - ACE restarted yesterday - recheck BMET prior to d/c  # FEN:  -  KVO IV  - ADAT  # PROPHYLAXIS:  - SCDs since bleeding  - protonix PO  # DISPO:  - pending clinical improvement and ability to tolerate food, likely d/c later today or tomorrow if tolerates foods  # CODE STATUS:  - full code  Levert Feinstein, MD Family Medicine PGY-1

## 2012-12-20 NOTE — Progress Notes (Signed)
Seen and examined.  Discussed with Dr. Pollie Meyer and agree with her management.  Briefly.  Jennifer Moss has know diverticulitis and had a bit of a setback as we advanced her diet.  Tolerated full liquid lunch OK with no nausea.  Still no BM.  Will not give stimulant laxative because of concern of diverticulitis obstruction.  Will try mild stim with glycerin suppository.

## 2012-12-20 NOTE — Progress Notes (Signed)
Vomited after being seen by JD,NP earlier today. Denies abdominal pain. Abd soft and completely non tender. Will follow and hopefully she will tolerate diet soon

## 2012-12-21 LAB — LIPASE, BLOOD: Lipase: 32 U/L (ref 11–59)

## 2012-12-21 LAB — BASIC METABOLIC PANEL
BUN: 10 mg/dL (ref 6–23)
Chloride: 102 mEq/L (ref 96–112)
GFR calc Af Amer: 40 mL/min — ABNORMAL LOW (ref >90)
Potassium: 3.6 mEq/L (ref 3.5–5.1)
Sodium: 139 mEq/L (ref 135–145)

## 2012-12-21 LAB — CBC
HCT: 45.7 % (ref 36.0–46.0)
Hemoglobin: 15.2 g/dL — ABNORMAL HIGH (ref 12.0–15.0)
RBC: 5.58 MIL/uL — ABNORMAL HIGH (ref 3.87–5.11)
WBC: 9.8 10*3/uL (ref 4.0–10.5)

## 2012-12-21 MED ORDER — PROMETHAZINE HCL 25 MG/ML IJ SOLN
25.0000 mg | Freq: Once | INTRAMUSCULAR | Status: AC
  Administered 2012-12-21: 25 mg via INTRAVENOUS
  Filled 2012-12-21: qty 1

## 2012-12-21 NOTE — Progress Notes (Signed)
Patient ID: Jennifer Moss, female   DOB: 03/19/1951, 61 y.o.   MRN: [id number] 4 Days Post-Op  Subjective: No pain this morning. She is transitioning to a solid diet. No N/V with meal this am. Objective: Vital signs in last 24 hours: Temp:  [98.2 F (36.8 C)-98.5 F (36.9 C)] 98.4 F (36.9 C) (12/24 0537) Pulse Rate:  [90-103] 90  (12/24 0537) Resp:  [18-19] 19  (12/24 0537) BP: (121-127)/(86-90) 123/86 mmHg (12/24 0537) SpO2:  [98 %] 98 % (12/24 0537) Weight:  [131 lb 3.2 oz (59.512 kg)] 131 lb 3.2 oz (59.512 kg) (12/24 0537) Last BM Date: 12/20/12  Intake/Output from previous day: 12/23 0701 - 12/24 0700 In: 883 [P.O.:400; I.V.:483] Out: 515 [Urine:515] Intake/Output this shift:    General appearance: alert, cooperative and no distress GI: normal findings: soft, non-tender  Lab Results:  No results found for this basename: WBC:2,HGB:2,HCT:2,PLT:2 in the last 72 hours BMET No results found for this basename: NA:2,K:2,CL:2,CO2:2,GLUCOSE:2,BUN:2,CREATININE:2,CALCIUM:2 in the last 72 hours   Studies/Results: No results found.  Anti-infectives: Anti-infectives     Start     Dose/Rate Route Frequency Ordered Stop   12/18/12 2200   ciprofloxacin (CIPRO) IVPB 400 mg  Status:  Discontinued        400 mg 200 mL/hr over 60 Minutes Intravenous Every 24 hours 12/17/12 1302 12/18/12 1056   12/18/12 2200   metroNIDAZOLE (FLAGYL) tablet 500 mg        500 mg Oral 3 times per day 12/18/12 2040 12/28/12 2159   12/18/12 1200   ciprofloxacin (CIPRO) tablet 500 mg        500 mg Oral 2 times daily 12/18/12 1056     12/16/12 0046   ciprofloxacin (CIPRO) IVPB 400 mg  Status:  Discontinued        400 mg 200 mL/hr over 60 Minutes Intravenous Every 12 hours 12/16/12 0046 12/17/12 1302   12/16/12 0046   metroNIDAZOLE (FLAGYL) IVPB 500 mg  Status:  Discontinued        500 mg 100 mL/hr over 60 Minutes Intravenous Every 8 hours 12/16/12 0046 12/18/12 2035           Assessment/Plan: Sigmoid diverticulitis with microperforation. Improving steadily on antibiotics. (Cipro/Flagyl for an additional 10 days) If she tolerates her diet today w/o further nausea; l I believe she could be discharged today on oral antibiotics and follow up with [place] on an outpatient basis. s/p Procedure(s): ESOPHAGOGASTRODUODENOSCOPY (EGD)-Mallory-Weiss tear, no further bleeding    LOS: 6 days    Golda Acre [facility] Pager [phone]  12/21/2012

## 2012-12-21 NOTE — Progress Notes (Signed)
Seen and examined.  Chart reviewed.  Discussed with Dr. Pollie Meyer.  Agree with her documentation and management.  I believe her clinical course continues to improve, albeit slowly.  I reviewed the CT scan and the diverticulitis is near the splenic flexure, adjacent to the stomach.  I suspect the inflammation near the stomach is the cause of her lingering nausea.  Will check another lipase just to make sure she hasn't also developed pancreatitis.

## 2012-12-21 NOTE — Progress Notes (Signed)
Family Medicine Teaching Service Daily Progress Note Service Page: [phone]  Patient Assessment: Jennifer Moss is a 61 y.o. year old female presenting with abdominal pain, rectal bleeding, and an episode of hematemesis, found to have diverticulitis on CT scan with ?microperforation. EGD 12/20 shows small mallory-weiss tears.  Subjective: Pt had a small bowel movement. She also vomited last night after eating pudding and ice cream. She has pain in the   Objective: Temp:  [98.2 F (36.8 C)-98.5 F (36.9 C)] 98.4 F (36.9 C) (12/24 0537) Pulse Rate:  [90-103] 90  (12/24 0537) Resp:  [18-19] 19  (12/24 0537) BP: (121-127)/(86-90) 123/86 mmHg (12/24 0537) SpO2:  [98 %] 98 % (12/24 0537) Weight:  [131 lb 3.2 oz (59.512 kg)] 131 lb 3.2 oz (59.512 kg) (12/24 0537) Exam: GEN: NAD, lying in bed HEENT: normocephalic, moist mucous membranes CV: RRR, no murmurs Pulm: normal respiratory effort ABD: soft, mildly TTP of epigastric area. BS +, no guarding EXT: Nontender to palpation in lower extremities, brisk capillary refill Neuro: nonfocal, grossly intact  I have reviewed the patient's medications, labs, imaging, and diagnostic testing.  Notable results are summarized below.  Medications:  Scheduled Meds: Amlodipine 10mg  daily Cipro 500mg  PO BID Lisinopril 20mg  daily Flagyl 500mg  PO q8h Protonix 40mg  PO daily  PRN Meds: Hydralazine 10mg  q6hprn BP > 180/100 Zofran prn  IVF: NS @ KVO  Labs:  CBC  Lab 12/18/12 0438 12/17/12 0530 12/16/12 1607  WBC 13.3* 17.6* 20.2*  HGB 13.9 14.0 14.5  HCT 42.8 43.4 43.6  PLT 271 260 277   BMET  Lab 12/18/12 0852 12/17/12 0530 12/16/12 0522  NA 141 141 141  K 4.2 3.3* 3.5  CL 106 104 104  CO2 17* 20 24  BUN 12 16 18   CREATININE 1.41* 1.73* 1.56*  GLUCOSE 137* 71 85  CALCIUM 8.8 9.1 8.9    Imaging/Diagnostic Tests:  CT Abd Pelvis with Contrast:  No evidence of bowel obstruction. Thick-walled loop of colon near the splenic  flexure. Colonic diverticulosis with associated pericolonic inflammatory changes in the left upper abdomen, suggesting diverticulitis. Tiny foci of gas adjacent to the descending colon are assumed to be within a diverticula (series 11/image 12), although a localized microperforation is also possible. No drainable fluid collection or abscess. No free air. Impression: Suspected descending colonic diverticulitis, as described above. Small area of localized microperforation is possible but is not favored. No drainable fluid collection or abscess. No free air. Crossed fused renal ectopia.  CXR: No evidence of acute cardiopulmonary disease.  EKG:  Normal sinus rhythm, with some new T wave flattening in v4-v6  EGD: Mallory-Weiss tear  Assessment/Plan:  Jennifer Moss is a 61 y.o. year old female presenting with abdominal pain, rectal bleeding, and hematemesis, found to have diverticulitis on CT scan. EGD 12/20 showing mild mallory weiss tears.  # GI: diverticulitis, rectal bleeding, small non-bleeding Mallory weiss tear. - Appreciate GI evaluation and recs. EGD report reviewed. Will continue PPI therapy. - Per surgery - no urgent surgical need at this time. - Advance diet from full liquids as tolerated - Treating diverticulitis with PO cipro & flagyl. - follow abdominal exam closely  - still some nausea today, continue Zofran 4mg  q6h prn nausea  - tylenol 650mg  q6h prn pain  # HEME/ID:   - leukocytosis improved as of 12/21, Hgb stable as of 12/21 - recheck CBC today - PO cipro & flagyl as above   # CARDIOVASCULAR: hypertensive at baseline  - continue  norvasc & lisinopril - prn IV hydralazine for BP >180/100.  - EKG with new T wave flattening in v4-v6  # NEURO:  - UDS, cocaine and marijuana positive  # Renal  - History of horseshoe kidney  - Appears to have underlying CKD wth creatinine baseline 2 years ago 1.3-1.4 - Improved cr to 1.41 on 12/21 - ACE restarted yesterday - recheck  BMET today  # FEN:  - KVO IV  - ADAT  # PROPHYLAXIS:  - SCDs since bleeding  - protonix PO  # DISPO:  - pending clinical improvement and ability to tolerate food, likely d/c later today or tomorrow if tolerates foods  # CODE STATUS:  - full code  Levert Feinstein, MD Family Medicine PGY-1

## 2012-12-21 NOTE — Progress Notes (Signed)
Pt with some mild nausea.  If she tolerated diet then home.  Pain mild.

## 2012-12-22 MED ORDER — PANTOPRAZOLE SODIUM 40 MG PO TBEC: 40.0000 mg | DELAYED_RELEASE_TABLET | Freq: Every day | ORAL | Status: DC

## 2012-12-22 MED ORDER — METRONIDAZOLE 500 MG PO TABS: 500.0000 mg | ORAL_TABLET | Freq: Three times a day (TID) | ORAL | Status: DC

## 2012-12-22 MED ORDER — CIPROFLOXACIN HCL 500 MG PO TABS: 500.0000 mg | ORAL_TABLET | Freq: Two times a day (BID) | ORAL | Status: DC

## 2012-12-22 NOTE — Progress Notes (Signed)
1250 discharge instructions and prescriptions given Verbalized understanding . Ambulated to lobby with family member

## 2012-12-22 NOTE — Progress Notes (Signed)
Agree with above, wbc normal, abdomen nontender, dc home oral abx

## 2012-12-22 NOTE — Progress Notes (Signed)
Family Medicine Teaching Service Daily Progress Note Service Page: [phone]  Patient Assessment: Jennifer Moss is a 61 y.o. year old female presenting with abdominal pain, rectal bleeding, and an episode of hematemesis, found to have diverticulitis on CT scan with ?microperforation. EGD 12/20 shows small mallory-weiss tears.  Subjective: Pt feeling well today and feels ready to go home. Has kept her dinner from last night and breakfast from this morning down.  Objective: Temp:  [98 F (36.7 C)-98.2 F (36.8 C)] 98 F (36.7 C) (12/25 0606) Pulse Rate:  [80-88] 88  (12/25 0606) Resp:  [18] 18  (12/25 0606) BP: (109-126)/(71-94) 109/71 mmHg (12/25 0606) SpO2:  [98 %-100 %] 100 % (12/25 0606) Weight:  [131 lb 13.4 oz (59.8 kg)] 131 lb 13.4 oz (59.8 kg) (12/25 0606) Exam: GEN: NAD, sitting up in chair HEENT: normocephalic CV: RRR Pulm: normal respiratory effort ABD: soft, nontender to palpation. BS +, no guarding EXT: Nontender to palpation in lower extremities Neuro: nonfocal, grossly intact  I have reviewed the patient's medications, labs, imaging, and diagnostic testing.  Notable results are summarized below.  Medications:  Scheduled Meds: Amlodipine 10mg  daily Cipro 500mg  PO BID Lisinopril 20mg  daily Flagyl 500mg  PO q8h Protonix 40mg  PO daily  PRN Meds: Hydralazine 10mg  q6hprn BP > 180/100 Zofran prn  IVF: NS @ KVO  Labs:  CBC  Lab 12/21/12 1005 12/18/12 0438 12/17/12 0530  WBC 9.8 13.3* 17.6*  HGB 15.2* 13.9 14.0  HCT 45.7 42.8 43.4  PLT 277 271 260   BMET  Lab 12/21/12 1005 12/18/12 0852 12/17/12 0530  NA 139 141 141  K 3.6 4.2 3.3*  CL 102 106 104  CO2 25 17* 20  BUN 10 12 16   CREATININE 1.59* 1.41* 1.73*  GLUCOSE 143* 137* 71  CALCIUM 9.2 8.8 9.1   Urine output 0.7 cc/kg/hr  Imaging/Diagnostic Tests:  CT Abd Pelvis with Contrast:  No evidence of bowel obstruction. Thick-walled loop of colon near the splenic flexure. Colonic diverticulosis  with associated pericolonic inflammatory changes in the left upper abdomen, suggesting diverticulitis. Tiny foci of gas adjacent to the descending colon are assumed to be within a diverticula (series 11/image 12), although a localized microperforation is also possible. No drainable fluid collection or abscess. No free air. Impression: Suspected descending colonic diverticulitis, as described above. Small area of localized microperforation is possible but is not favored. No drainable fluid collection or abscess. No free air. Crossed fused renal ectopia.  CXR: No evidence of acute cardiopulmonary disease.  EKG:  Normal sinus rhythm, with some new T wave flattening in v4-v6  EGD: Mallory-Weiss tear  Assessment/Plan:  Jennifer Moss is a 61 y.o. year old female presenting with abdominal pain, rectal bleeding, and hematemesis, found to have diverticulitis on CT scan. EGD 12/20 showing mild mallory weiss tears.  # GI: diverticulitis, rectal bleeding, small non-bleeding Mallory weiss tear. - Appreciate GI evaluation and recs. EGD report reviewed. Will continue PPI therapy. - Per surgery - no urgent surgical need at this time. - Advance diet from full liquids as tolerated - Treating diverticulitis with PO cipro & flagyl. - plan for d/c home today with PO cipro & flagyl - tylenol 650mg  q6h prn pain  # HEME/ID:   - leukocytosis improved as of 12/21, Hgb stable as of 12/24 - PO cipro & flagyl as above   # CARDIOVASCULAR: hypertensive at baseline  - continue norvasc & lisinopril - prn IV hydralazine for BP >180/100.  - EKG with new  T wave flattening in v4-v6 seen day after admission  # NEURO:  - UDS, cocaine and marijuana positive  # Renal  - History of horseshoe kidney  - Appears to have underlying CKD wth creatinine baseline 2 years ago 1.3-1.4 - Cr yesterday 1.59  # FEN:  - KVO IV  - ADAT  # PROPHYLAXIS:  - SCDs since bleeding  - protonix PO  # DISPO:  - d/c home today  #  CODE STATUS:  - full code  Levert Feinstein, MD Family Medicine PGY-1

## 2012-12-22 NOTE — Progress Notes (Signed)
Patient ID: Jennifer Moss, female   DOB: [birth year], 61 y.o.   MRN: [id number] 5 Days Post-Op  Subjective: No pain this morning. Feels good, no issues with diet.  Objective: Vital signs in last 24 hours: Temp:  [98 F (36.7 C)-98.2 F (36.8 C)] 98 F (36.7 C) (12/25 0606) Pulse Rate:  [80-88] 88  (12/25 0606) Resp:  [18] 18  (12/25 0606) BP: (109-126)/(71-94) 109/71 mmHg (12/25 0606) SpO2:  [98 %-100 %] 100 % (12/25 0606) Weight:  [131 lb 13.4 oz (59.8 kg)] 131 lb 13.4 oz (59.8 kg) (12/25 0606) Last BM Date: 12/21/12  Intake/Output from previous day: 12/24 0701 - 12/25 0700 In: 1145.3 [P.O.:720; I.V.:421.3; IV Piggyback:4] Out: 975 [Urine:975] Intake/Output this shift:    General appearance: alert, cooperative and no distress GI: normal findings: soft, non-tender  Lab Results:   Basename 12/21/12 1005  WBC 9.8  HGB 15.2*  HCT 45.7  PLT 277   BMET  Basename 12/21/12 1005  NA 139  K 3.6  CL 102  CO2 25  GLUCOSE 143*  BUN 10  CREATININE 1.59*  CALCIUM 9.2     Studies/Results: No results found.  Anti-infectives: Anti-infectives     Start     Dose/Rate Route Frequency Ordered Stop   12/18/12 2200   ciprofloxacin (CIPRO) IVPB 400 mg  Status:  Discontinued        400 mg 200 mL/hr over 60 Minutes Intravenous Every 24 hours 12/17/12 1302 12/18/12 1056   12/18/12 2200   metroNIDAZOLE (FLAGYL) tablet 500 mg        500 mg Oral 3 times per day 12/18/12 2040 12/28/12 2159   12/18/12 1200   ciprofloxacin (CIPRO) tablet 500 mg        500 mg Oral 2 times daily 12/18/12 1056     12/16/12 0046   ciprofloxacin (CIPRO) IVPB 400 mg  Status:  Discontinued        400 mg 200 mL/hr over 60 Minutes Intravenous Every 12 hours 12/16/12 0046 12/17/12 1302   12/16/12 0046   metroNIDAZOLE (FLAGYL) IVPB 500 mg  Status:  Discontinued        500 mg 100 mL/hr over 60 Minutes Intravenous Every 8 hours 12/16/12 0046 12/18/12 2035          Assessment/Plan: s/p  Procedure(s): ESOPHAGOGASTRODUODENOSCOPY (EGD)-Mallory-Weiss tear, no further bleeding  Sigmoid diverticulitis with microperforation. Improving steadily on antibiotics. (Cipro/Flagyl for an additional 10 days) upon discharge. Frommsurgical standpoint she could be discharged today on oral antibiotics and follow up with [place] on an outpatient basis.    LOS: 7 days    [name] [facility] Surgery Pager [phone]  12/22/2012

## 2012-12-22 NOTE — Progress Notes (Signed)
I discussed with Dr McIntyre.  I agree with their plans documented in their progress note for today.  

## 2012-12-23 ENCOUNTER — Encounter: Payer: Self-pay | Admitting: Family Medicine

## 2012-12-23 DIAGNOSIS — F1910 Other psychoactive substance abuse, uncomplicated: Secondary | ICD-10-CM | POA: Diagnosis present

## 2012-12-23 DIAGNOSIS — I10 Essential (primary) hypertension: Secondary | ICD-10-CM | POA: Diagnosis present

## 2012-12-23 DIAGNOSIS — K5792 Diverticulitis of intestine, part unspecified, without perforation or abscess without bleeding: Secondary | ICD-10-CM | POA: Diagnosis present

## 2012-12-23 DIAGNOSIS — N183 Chronic kidney disease, stage 3 (moderate): Secondary | ICD-10-CM

## 2012-12-23 DIAGNOSIS — Q631 Lobulated, fused and horseshoe kidney: Secondary | ICD-10-CM

## 2012-12-23 DIAGNOSIS — N184 Chronic kidney disease, stage 4 (severe): Secondary | ICD-10-CM | POA: Insufficient documentation

## 2012-12-23 DIAGNOSIS — K625 Hemorrhage of anus and rectum: Secondary | ICD-10-CM | POA: Diagnosis present

## 2012-12-23 DIAGNOSIS — K226 Gastro-esophageal laceration-hemorrhage syndrome: Secondary | ICD-10-CM | POA: Diagnosis present

## 2012-12-23 NOTE — Discharge Summary (Signed)
Family Medicine Teaching [facility] Discharge Summary  Patient name: Jennifer Moss Medical record number: [id number] Date of birth: [birth year] Age: 61 y.o. Gender: female Date of Admission: 12/15/2012  Date of Discharge: 12/22/12 Admitting Physician: Nestor Ramp, MD  Primary Care Provider: Default, Provider, MD  Indication for Hospitalization: rectal bleeding, hematemesis, acute diverticulitis  Patient Active Problem List  Diagnosis  . Diverticulitis  . Mallory-Weiss tear  . Rectal bleeding  . Hypertension  . Drug abuse  . Horseshoe kidney  . Chronic kidney disease, stage 3   Brief Hospital Course:  Jennifer Moss is a 61 y.o. female who was admitted to the hospital after presenting to the ER complaining of rectal bleeding, abdominal pain, and had an episode of hematemesis while in the ER.   # diverticulitis: An initial CT scan of the abdomen showed descending colonic diverticulitis with possible microperforation. Pt was admitted to the hospital and given IV cipro and flagyl. GI and general surgery were consulted. Surgery did not feel that there was a need for urgent surgical intervention, and that medical therapy would likely be sufficient. GI recommended that pt be referred for outpatient colonoscopy for rectal bleeding once diverticulitis resolves. Patient's abdominal pain gradually improved, and she was transitioned to PO cipro and flagyl. She will complete a total of 14 days of these antibiotics at home.  # mallory-weiss tear: Pt had episode of hematemesis while in CT scan in ER. GI took patient for EGD on 12/20, and found a non-bleeding mallory weiss tear, and recommended 6 weeks of PPI treatment. Hgb remained stable throughout hospitalization, without further episodes of hematemesis.  # hypertension: continued home norvasc & lisinopril while hospitalized. Her home clonidine was resumed at d/c.  # ekg changes: the morning after admission pt had an EKG with new T wave  flattening of v4-v6. This was not considered to be clinically significant and was possibly due to cocaine abuse. No further workup was done inpatient, but this can be further worked up as an outpatient.  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 Baseline creatinine 2 years ago appeared to be 1.3-1.4. Her creatinine remained in the 1.4-1.7 range while hospitalized and was 1.59 on the day of discharge.  Consultations: GI, general surgery  Procedures: EGD on 12/17/12  Significant Labs and Imaging:   CT Abd Pelvis with Contrast 12/15/12:  No evidence of bowel obstruction. Thick-walled loop of colon near the splenic flexure. Colonic diverticulosis with associated pericolonic inflammatory changes in the left upper abdomen, suggesting diverticulitis. Tiny foci of gas adjacent to the descending colon are assumed to be within a diverticula (series 11/image 12), although a localized microperforation is also possible. No drainable fluid collection or abscess. No free air. Impression: Suspected descending colonic diverticulitis, as described above. Small area of localized microperforation is possible but is not favored. No drainable fluid collection or abscess. No free air. Crossed fused renal ectopia.   CXR: 12/16/12:  No evidence of acute cardiopulmonary disease.   EKG 12/17/12:  Normal sinus rhythm, with some new T wave flattening in v4-v6   EGD 12/17/12:  Mallory-Weiss tear  Discharge Medications:    Medication List     As of 12/23/2012  7:18 PM    TAKE these medications         amLODipine 10 MG tablet  Commonly known as: NORVASC   Take 10 mg by mouth daily.      cholecalciferol 1000 UNITS tablet   Commonly known as: VITAMIN D   Take 1,000 Units by mouth daily.      ciprofloxacin 500 MG tablet    Commonly known as: CIPRO   Take 1 tablet (500 mg total) by mouth 2 (two) times daily.      cloNIDine 0.2 MG tablet   Commonly known as: CATAPRES   Take 0.2 mg by mouth 2 (two) times daily.      lisinopril 20 MG tablet   Commonly known as: PRINIVIL,ZESTRIL   Take 20 mg by mouth daily.      metroNIDAZOLE 500 MG tablet   Commonly known as: FLAGYL   Take 1 tablet (500 mg total) by mouth every 8 (eight) hours.      omega-3 acid ethyl esters 1 G capsule   Commonly known as: LOVAZA   Take 1 g by mouth 2 (two) times daily.      pantoprazole 40 MG tablet   Commonly known as: PROTONIX   Take 1 tablet (40 mg total) by mouth daily.        Issues for Follow Up:  -Patient has no health insurance, and thus we had some difficulty getting her medications at discharge (especially as she was d/c'd on Christmas day. The plan we came up with was that she would go to a [facility] hr [facility] and get two days worth of her meds on day of d/c, and then get in touch with case manager the following day to get the match program letter for medication assistance. She will then return to another pharmacy (with printed scripts for the rest of her medications) to get these meds on 12/27. Please ensure she successfully got all of these medications at her f/u visit. -Monitor for continued resolution of GI symptoms -pt should continue taking PPI for 6 weeks after d/c due to mallory-weiss tear -recommend repeating a BMET at some point after d/c to ensure stable creatinine  -recommend outpatient cardiac workup as indicated, as an EKG did show some T-wave flattening in v4-v6 -patient will generally need to get established with the [facility] for her routine care, as [facility]. -pt is to f/u with Dr. Johna Sheriff of general surgery within 2-3 weeks after d/c  Outstanding Results: none      Follow-up Information    Follow up with [facility], MD. On 12/24/2012. (at 3:30 pm)    Contact information:    [address] [place] [address] [phone]       Follow up with Mariella Saa, MD. (Call our office to schedule a follow up appointment in the next 2-3 weeks time.)    Contact information:   [address] [place] [address] [phone]          Discharge Condition: stable  Discharge Instructions: Please refer to Patient Instructions section of EMR for full details.  Patient was counseled important signs and symptoms that should prompt return to medical care, changes in medications, dietary instructions, activity restrictions, and follow up appointments.    Levert Feinstein, MD Family Medicine PGY-1

## 2012-12-24 ENCOUNTER — Ambulatory Visit (INDEPENDENT_AMBULATORY_CARE_PROVIDER_SITE_OTHER): Payer: Self-pay | Admitting: Sports Medicine

## 2012-12-24 ENCOUNTER — Inpatient Hospital Stay: Payer: Self-pay | Admitting: Family Medicine

## 2012-12-24 VITALS — BP 89/74 | HR 82 | Temp 98.4°F | Ht 60.0 in | Wt 139.0 lb

## 2012-12-24 DIAGNOSIS — I10 Essential (primary) hypertension: Secondary | ICD-10-CM

## 2012-12-24 DIAGNOSIS — K625 Hemorrhage of anus and rectum: Secondary | ICD-10-CM

## 2012-12-24 MED ORDER — LISINOPRIL 20 MG PO TABS: 20.0000 mg | ORAL_TABLET | Freq: Every day | ORAL | Status: DC

## 2012-12-24 NOTE — Patient Instructions (Addendum)
It was nice to meet you.  Please follow up and get your medicines today.  Please return to clinic in 1-2 weeks to check your BP and establish care with [place].

## 2012-12-24 NOTE — Assessment & Plan Note (Signed)
Pt BP low today. Will hold BP meds for next 1-2 days and pt will recheck her BP. Reviewed RED FLAGS. ? Compliance. >Consider stopping Clonidine as likely causing some rebound effect >Consider increasing Lisinopril

## 2012-12-24 NOTE — Discharge Summary (Signed)
I have seen and examined this patient. I have discussed with Dr McIntyre.  I agree with their findings and plans as documented in their progress note.    

## 2012-12-26 NOTE — Assessment & Plan Note (Signed)
Improved.  Not symptomatic at this time and no further bleed.  Hgb/Hct high-normal at time of discharge.

## 2012-12-26 NOTE — Progress Notes (Signed)
  [facility]  Patient name: Jennifer Moss MRN [id number]  Date of birth: [birth year]  CC & HPI:  Jennifer Moss is a 61 y.o. female presenting today for Hospital Followup:  Reports getting her medications.  HYPERTENSION: taking medications as instructed, no medication side effects noted, possible neurological symptoms none, no chest pain on exertion, no dyspnea on exertion and no swelling of ankles.  No orthopnea or orthostasis.    # rectal bleeding.  None at this time. No hematachezia or melana   ROS:  Per HPI  Pertinent History Reviewed:  Medical & Surgical Hx:  Reviewed: Significant for recent GI bleed, recent Cocaine/THC use Medications: Reviewed & Updated - see associated section Social History: Reviewed -  reports that she has never smoked. She has never used smokeless tobacco.  Objective Findings:  Vitals:  Filed Vitals:   12/24/12 1042  BP: 89/74  Pulse: 82  Temp: 98.4 F (36.9 C)  TempSrc: Oral  Height: 5' (1.524 m)  Weight: 139 lb (63.05 kg)   PE: GENERAL:  Adult thin, AA female. In no discomfort; no respiratory distress. PSYCH: Alert and appropriately interactive; Insight:Fair   H&N: AT/[place], trachea midline EENT:  MMM, no scleral icterus, EOMi HEART: RRR, S1/S2 heard, no murmur LUNGS: CTA B, no wheezes, no crackles EXTREMITIES: Moves all 4 extremities spontaneously, warm well perfused, no edema, bilateral DP and PT pulses 2/4.     Marland Kitchen  Assessment & Plan:

## 2013-01-11 ENCOUNTER — Ambulatory Visit (INDEPENDENT_AMBULATORY_CARE_PROVIDER_SITE_OTHER): Payer: Self-pay | Admitting: Family Medicine

## 2013-01-11 ENCOUNTER — Encounter: Payer: Self-pay | Admitting: Family Medicine

## 2013-01-11 ENCOUNTER — Ambulatory Visit (HOSPITAL_COMMUNITY)
Admission: RE | Admit: 2013-01-11 | Discharge: 2013-01-11 | Disposition: A | Discharge status: Home / Self Care | Payer: Self-pay | Source: Ambulatory Visit | Attending: Family Medicine | Admitting: Family Medicine

## 2013-01-11 VITALS — BP 168/116 | HR 80 | Ht 60.0 in | Wt 137.0 lb

## 2013-01-11 DIAGNOSIS — M79606 Pain in leg, unspecified: Secondary | ICD-10-CM

## 2013-01-11 DIAGNOSIS — R9431 Abnormal electrocardiogram [ECG] [EKG]: Secondary | ICD-10-CM | POA: Insufficient documentation

## 2013-01-11 DIAGNOSIS — F17200 Nicotine dependence, unspecified, uncomplicated: Secondary | ICD-10-CM

## 2013-01-11 DIAGNOSIS — M7989 Other specified soft tissue disorders: Secondary | ICD-10-CM

## 2013-01-11 DIAGNOSIS — Z720 Tobacco use: Secondary | ICD-10-CM

## 2013-01-11 DIAGNOSIS — M79609 Pain in unspecified limb: Secondary | ICD-10-CM

## 2013-01-11 DIAGNOSIS — M799 Soft tissue disorder, unspecified: Secondary | ICD-10-CM

## 2013-01-11 DIAGNOSIS — R079 Chest pain, unspecified: Secondary | ICD-10-CM

## 2013-01-11 DIAGNOSIS — F1910 Other psychoactive substance abuse, uncomplicated: Secondary | ICD-10-CM

## 2013-01-11 DIAGNOSIS — I10 Essential (primary) hypertension: Secondary | ICD-10-CM

## 2013-01-11 HISTORY — DX: Abnormal electrocardiogram (ECG) (EKG): R94.31

## 2013-01-11 HISTORY — DX: Pain in leg, unspecified: M79.606

## 2013-01-11 LAB — POCT SEDIMENTATION RATE: POCT SED RATE: 7 mm/hr (ref 0–22)

## 2013-01-11 LAB — CK: Total CK: 54 U/L (ref 7–177)

## 2013-01-11 NOTE — Progress Notes (Signed)
Patient ID: NIKKOL PAI, female   DOB: August 13, 1951, 62 y.o.   MRN: [id number]  CC: Jennifer Moss is a 62 y.o. female here to follow up on her blood pressure, as well as discuss "knots" in her legs.   HPI: Blood pressure: is currently only taking lisinopril because she ran out of clonidine & amlodipine. She has refills ready at the pharmacy, but hasn't picked them up yet. She says she can tell that her blood pressure is high. No LE swelling.   Chest pain: On ROS she does endorse chest pain that happens for about 5-10 minutes at a time. It is worse with walking. She does get somewhat short of breath with it. It is relieved by sitting. She says she has stopped using marijuana and cocaine since being discharged from the hospital (did it one time, but has since quit). The chest pain is dull feeling.   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 She says she has noticed new knots in both of her legs, L>R. They are tender and she cannot lay down from them. Sometimes her toes get cold and have pin-like sensations.   Diverticulitis/mallory weiss tear: was recently hospitalized last month with diverticulitis with possible small microperforation, managed medically with cipro & flagyl. She has completed these antibiotics. Also had an EGD during this hospitalization (due to hematemesis) and had a non-bleeding mallory weiss tear. Is still taking a PPI for this. Still has some left sided abdominal pain and is occasionally nauseated. Is having bowel movements. Overall seems to be improving, though.  ROS: See HPI  PHYSICAL EXAM: BP 168/116  Pulse 80  Ht 5' (1.524 m)  Wt 137 lb (62.143 kg)  BMI 26.76 kg/m2 Gen: NAD, pleasant, cooperative Heart: RRR, no m/r/g auscultated Lungs: CTAB, normal respiratory effort Neuro: speech intact, nonfocal Ext: 2+ DP bilaterally, no edema or deformity, hyperpigmented toenails on bilateral 5th toes (which per pt is chronic  and hereditary - mother and brother have the same), several ~1cm tender soft subcutaneous nodules in the left lateral thigh which are difficult to palpate, ~6in well healed old scar from surgical excision on posterior left thigh

## 2013-01-11 NOTE — Patient Instructions (Addendum)
It was great to see you again today!  We need to get your blood pressure under control, to help your heart and chest pain. Restart your amlodipine today. Do NOT take the clonidine for now. Continue the lisinopril. It is VERY important that you not use cocaine or any other illegal drugs, as this could harm your heart and potentially be fatal.  It is also best for you to stop using tobacco products altogether, to help your heart.  For the knots in your leg, these are probably benign (normal), but we are doing some blood work today to rule out bad things. I will call you if your test results are not normal.  Otherwise, I will send you a letter.  If you do not hear from me with in 2 weeks please call our office.     Schedule an appointment to come back in 1-2 weeks to recheck your blood pressure and see how you are doing. In the meantime, if you have any chest pain that lasts longer than 10-15 minutes or does not go away when you rest, go to the emergency room immediately.  -Dr. Pollie Meyer

## 2013-01-12 DIAGNOSIS — R079 Chest pain, unspecified: Secondary | ICD-10-CM | POA: Insufficient documentation

## 2013-01-12 DIAGNOSIS — Z720 Tobacco use: Secondary | ICD-10-CM | POA: Insufficient documentation

## 2013-01-12 DIAGNOSIS — M7989 Other specified soft tissue disorders: Secondary | ICD-10-CM | POA: Insufficient documentation

## 2013-01-12 NOTE — Assessment & Plan Note (Signed)
Pt reports smoking "just puffs" from ~1 cigarette per day. Encouraged cessation, although she says this is what is helping her abstain from cocaine and marijuana.

## 2013-01-12 NOTE — Assessment & Plan Note (Signed)
BP elevated today in clinic. Currently just on lisinopril. Have instructed patient to restart amlodipine. Hold off on restarting clonidine - was hypotensive at last visit, and do not want to restart two new medications at one time today. I suspect clonidine may not be the best choice for her, due to risk of rebound hypertension, although she says she likes taking it. She is to f/u in 1-2 weeks to recheck her BP and f/u on chest pain.

## 2013-01-12 NOTE — Assessment & Plan Note (Signed)
Patient reports she has stopped using marijuana and cocaine. I have strongly advised to her that she NOT use cocaine due to her chest pain and long QT on EKG. She understands the risk of fatal arrhythmia if she were to use cocaine. Also advised against using marijuana.

## 2013-01-12 NOTE — Assessment & Plan Note (Signed)
Pt has tender small ~1cm nodules that appear to be within the fat of her left thigh (endorses them bilaterally). These are possibly benign lipomas (pt reports a hx of having a benign tumor removed from her left leg some years ago). Will obtain CK & ESR today to rule out muscle disorder or acute process.

## 2013-01-12 NOTE — Assessment & Plan Note (Addendum)
Pt endorsed CP on ROS today. Pain appears to be anginal (worse with exertion, relieved by rest, dull description, associated with SOB). Lasting 5-10 minutes at a time. Risk factors include age, currently uncontrolled hypertension, cocaine abuse, tobacco & marijuana abuse. EKG performed today in clinic shows no ST elevation or arrhythmia, but does show prolonged QT. Will need to be careful about medication choices in the future with this prolonged QT.   For now, will focus on management of uncontrolled hypertension, by restarting amlodipine. Will not give nitroglycerin due to recent hypotension at previous clinic visit. Also currently avoiding beta blockers due to history of recent cocaine abuse. Will also obtain records from [facility] to look for other modifiable risk factors (lipids, etc). Will discuss starting on aspirin at f/u visit.  Pt is to return in 1-2 weeks to follow up on chest pain, and knows to go to the ER if she has any persistent chest pain. Precepted with Dr. Deirdre Priest who agrees with this plan.

## 2013-01-12 NOTE — Assessment & Plan Note (Signed)
Noted on EKG today in clinic. QT 454, QTc 479. Will want to avoid medications that prolong QT in the future. Counseled on importance of abstaining from cocaine, as above.

## 2013-01-13 ENCOUNTER — Encounter: Payer: Self-pay | Admitting: Family Medicine

## 2013-01-18 ENCOUNTER — Ambulatory Visit (INDEPENDENT_AMBULATORY_CARE_PROVIDER_SITE_OTHER): Payer: Self-pay | Admitting: Family Medicine

## 2013-01-18 ENCOUNTER — Encounter (HOSPITAL_COMMUNITY): Payer: Self-pay | Admitting: Vascular Surgery

## 2013-01-18 ENCOUNTER — Observation Stay (HOSPITAL_COMMUNITY)
Admission: EM | Admit: 2013-01-18 | Discharge: 2013-01-19 | Disposition: A | Discharge status: Home / Self Care | Payer: Self-pay | Attending: Family Medicine | Admitting: Family Medicine

## 2013-01-18 ENCOUNTER — Other Ambulatory Visit: Payer: Self-pay

## 2013-01-18 ENCOUNTER — Ambulatory Visit (HOSPITAL_COMMUNITY)
Admission: RE | Admit: 2013-01-18 | Discharge: 2013-01-18 | Disposition: A | Discharge status: Home / Self Care | Payer: Self-pay | Source: Ambulatory Visit | Attending: Family Medicine | Admitting: Family Medicine

## 2013-01-18 ENCOUNTER — Emergency Department (HOSPITAL_COMMUNITY): Payer: Self-pay

## 2013-01-18 ENCOUNTER — Encounter: Payer: Self-pay | Admitting: Family Medicine

## 2013-01-18 VITALS — BP 141/94 | HR 65 | Temp 98.0°F | Ht 60.0 in | Wt 138.4 lb

## 2013-01-18 DIAGNOSIS — R079 Chest pain, unspecified: Secondary | ICD-10-CM

## 2013-01-18 DIAGNOSIS — F1410 Cocaine abuse, uncomplicated: Secondary | ICD-10-CM

## 2013-01-18 DIAGNOSIS — R0789 Other chest pain: Secondary | ICD-10-CM

## 2013-01-18 DIAGNOSIS — M25519 Pain in unspecified shoulder: Secondary | ICD-10-CM | POA: Insufficient documentation

## 2013-01-18 DIAGNOSIS — F1910 Other psychoactive substance abuse, uncomplicated: Secondary | ICD-10-CM | POA: Diagnosis present

## 2013-01-18 DIAGNOSIS — F1490 Cocaine use, unspecified, uncomplicated: Secondary | ICD-10-CM

## 2013-01-18 DIAGNOSIS — R0602 Shortness of breath: Secondary | ICD-10-CM | POA: Insufficient documentation

## 2013-01-18 DIAGNOSIS — Z79899 Other long term (current) drug therapy: Secondary | ICD-10-CM | POA: Insufficient documentation

## 2013-01-18 DIAGNOSIS — F17200 Nicotine dependence, unspecified, uncomplicated: Secondary | ICD-10-CM | POA: Insufficient documentation

## 2013-01-18 DIAGNOSIS — I10 Essential (primary) hypertension: Secondary | ICD-10-CM | POA: Diagnosis present

## 2013-01-18 DIAGNOSIS — S4992XA Unspecified injury of left shoulder and upper arm, initial encounter: Secondary | ICD-10-CM | POA: Diagnosis present

## 2013-01-18 HISTORY — DX: Other psychoactive substance abuse, uncomplicated: F19.10

## 2013-01-18 HISTORY — DX: Diverticulitis of intestine, part unspecified, without perforation or abscess without bleeding: K57.92

## 2013-01-18 HISTORY — DX: Hemorrhage of anus and rectum: K62.5

## 2013-01-18 HISTORY — DX: Gastro-esophageal laceration-hemorrhage syndrome: K22.6

## 2013-01-18 LAB — COMPREHENSIVE METABOLIC PANEL
ALT: 15 U/L (ref 0–35)
AST: 15 U/L (ref 0–37)
Alkaline Phosphatase: 80 U/L (ref 39–117)
CO2: 26 mEq/L (ref 19–32)
GFR calc Af Amer: 49 mL/min — ABNORMAL LOW (ref >90)
GFR calc non Af Amer: 42 mL/min — ABNORMAL LOW (ref >90)
Glucose, Bld: 86 mg/dL (ref 70–99)
Potassium: 4.3 mEq/L (ref 3.5–5.1)
Sodium: 141 mEq/L (ref 135–145)

## 2013-01-18 LAB — TROPONIN I
Troponin I: <0.3 ng/mL (ref <0.30)
Troponin I: <0.3 ng/mL (ref <0.30)

## 2013-01-18 LAB — URINALYSIS, ROUTINE W REFLEX MICROSCOPIC
Glucose, UA: NEGATIVE mg/dL
Leukocytes, UA: NEGATIVE
Protein, ur: NEGATIVE mg/dL
pH: 5.5 (ref 5.0–8.0)

## 2013-01-18 LAB — POCT I-STAT TROPONIN I: Troponin i, poc: 0 ng/mL (ref 0.00–0.08)

## 2013-01-18 LAB — CREATININE, SERUM: Creatinine, Ser: 1.51 mg/dL — ABNORMAL HIGH (ref 0.50–1.10)

## 2013-01-18 LAB — CBC
Hemoglobin: 13.4 g/dL (ref 12.0–15.0)
Hemoglobin: 12.8 g/dL (ref 12.0–15.0)
MCH: 26.4 pg (ref 26.0–34.0)
MCHC: 32.1 g/dL (ref 30.0–36.0)
MCV: 82.3 fL (ref 78.0–100.0)
Platelets: 294 10*3/uL (ref 150–400)
RBC: 4.86 MIL/uL (ref 3.87–5.11)
WBC: 9.6 10*3/uL (ref 4.0–10.5)

## 2013-01-18 LAB — URINE MICROSCOPIC-ADD ON

## 2013-01-18 MED ORDER — ASPIRIN 81 MG PO CHEW: 162.0000 mg | CHEWABLE_TABLET | Freq: Once | ORAL | Status: DC

## 2013-01-18 MED ORDER — OMEGA-3-ACID ETHYL ESTERS 1 G PO CAPS
1.0000 g | ORAL_CAPSULE | Freq: Two times a day (BID) | ORAL | Status: DC
  Administered 2013-01-18 – 2013-01-19 (×2): 1 g via ORAL
  Filled 2013-01-18 (×3): qty 1

## 2013-01-18 MED ORDER — ONDANSETRON HCL 4 MG PO TABS: 4.0000 mg | ORAL_TABLET | Freq: Four times a day (QID) | ORAL | Status: DC | PRN

## 2013-01-18 MED ORDER — OXYCODONE-ACETAMINOPHEN 5-325 MG PO TABS
2.0000 | ORAL_TABLET | Freq: Once | ORAL | Status: AC
  Administered 2013-01-18: 2 via ORAL
  Filled 2013-01-18: qty 2

## 2013-01-18 MED ORDER — VITAMIN D3 25 MCG (1000 UNIT) PO TABS
1000.0000 [IU] | ORAL_TABLET | Freq: Every day | ORAL | Status: DC
  Administered 2013-01-19: 1000 [IU] via ORAL
  Filled 2013-01-18: qty 1

## 2013-01-18 MED ORDER — LISINOPRIL 20 MG PO TABS
20.0000 mg | ORAL_TABLET | Freq: Every day | ORAL | Status: DC
  Administered 2013-01-19: 20 mg via ORAL
  Filled 2013-01-18: qty 1

## 2013-01-18 MED ORDER — SODIUM CHLORIDE 0.9 % IJ SOLN
3.0000 mL | Freq: Two times a day (BID) | INTRAMUSCULAR | Status: DC
  Administered 2013-01-18 – 2013-01-19 (×2): 3 mL via INTRAVENOUS

## 2013-01-18 MED ORDER — OXYCODONE-ACETAMINOPHEN 5-325 MG PO TABS
1.0000 | ORAL_TABLET | Freq: Once | ORAL | Status: AC
  Administered 2013-01-18: 1 via ORAL
  Filled 2013-01-18: qty 1

## 2013-01-18 MED ORDER — ASPIRIN EC 325 MG PO TBEC: 325.0000 mg | DELAYED_RELEASE_TABLET | Freq: Once | ORAL | Status: DC

## 2013-01-18 MED ORDER — PANTOPRAZOLE SODIUM 40 MG PO TBEC
40.0000 mg | DELAYED_RELEASE_TABLET | Freq: Every day | ORAL | Status: DC
  Administered 2013-01-19: 40 mg via ORAL
  Filled 2013-01-18: qty 1

## 2013-01-18 MED ORDER — AMLODIPINE BESYLATE 10 MG PO TABS
10.0000 mg | ORAL_TABLET | Freq: Every day | ORAL | Status: DC
  Administered 2013-01-19: 10 mg via ORAL
  Filled 2013-01-18: qty 1

## 2013-01-18 MED ORDER — ONDANSETRON HCL 4 MG/2ML IJ SOLN: 4.0000 mg | Freq: Four times a day (QID) | INTRAMUSCULAR | Status: DC | PRN

## 2013-01-18 MED ORDER — NITROGLYCERIN 0.4 MG SL SUBL: 0.4000 mg | SUBLINGUAL_TABLET | SUBLINGUAL | Status: DC | PRN

## 2013-01-18 MED ORDER — HEPARIN SODIUM (PORCINE) 5000 UNIT/ML IJ SOLN
5000.0000 [IU] | Freq: Three times a day (TID) | INTRAMUSCULAR | Status: DC
  Administered 2013-01-18 – 2013-01-19 (×2): 5000 [IU] via SUBCUTANEOUS
  Filled 2013-01-18 (×5): qty 1

## 2013-01-18 NOTE — ED Notes (Addendum)
Pt reports to the ED for eval of left sided CP and SOB with exertion x 2 weeks. The pain lasts for 5 -10 mins and then resolves. However, it has begun to radiate down the left arm. Pt was seen at [facility] and transferred here. Er EMS the pain states behind her left shoulder and radiates into her left chest and down her arm. Pain is worse with palpation, movement, and deep inspiration. Pt has had 324 of ASA and 2 nitro prior to arrival. Pt reports a decrease in CP from 9/10 to a 7/10. Pt reports dizziness with standing. Pt denies any N/V or episodes of diaphoresis. Per EMS the EKG from [facility] was unremarkable.

## 2013-01-18 NOTE — ED Provider Notes (Signed)
History     CSN: [id number]  Arrival date & time 01/18/13  1124   First MD Initiated Contact with Patient 01/18/13 1126      Chief Complaint  Patient presents with  . Chest Pain    (Consider location/radiation/quality/duration/timing/severity/associated sxs/prior treatment) HPI Jenna's comp is a 62 year old female who presents the emergency department with chief complaint of chest pain.  Patient states that she has had left-sided chest pain with radiation into the left arm for the past 3 weeks.  Patient states that it can come at any time at rest or with exertion.  She states that it generally lasts about 5-10 minutes rates it at a 5/10 and describes it as told and achy.  She denies any chest pressure, diaphoresis, nausea or vomiting.  Patient also states that over the past 2 days she has had increasing pain in the left shoulder blade worse with movement rated at an 8/10.  Patient denies any recent cocaine use.  She does have hypertension which she states is well controlled.  She is a nonsmoker.  Patient denies being diabetic.  Patient states that she has a very strong family history for heart attack and stroke including 2 aunts with heart attack under the age of 29.  Patient also has past medical history of costochondritis. Past Medical History  Diagnosis Date  . Hypertension   . Hyperlipemia   . Anginal pain   . Shortness of breath   . Chronic kidney disease     rt kidney horseshoe  . Arthritis   . Retinal detachment     R eye, per patient  . Mallory-Weiss tear   . Diverticulitis   . Rectal bleed   . Drug abuse     Past Surgical History  Procedure Date  . Diagnostic laparoscopy     for horseshoe kidney  . Appendectomy   . Lipoma removal     in 1990s had an egg-sized tumor removed from her leg, she thinks it was a lipoma  . Repair of detached retina   . Esophagogastroduodenoscopy 12/17/2012    Procedure: ESOPHAGOGASTRODUODENOSCOPY (EGD);  Surgeon: Graylin Shiver, MD;   Location: [facility] ENDOSCOPY;  Service: Endoscopy;  Laterality: N/A;    History reviewed. No pertinent family history.  History  Substance Use Topics  . Smoking status: Current Some Day Smoker    Types: Cigarettes  . Smokeless tobacco: Never Used  . Alcohol Use: Yes     Comment: occ    OB History    Grav Para Term Preterm Abortions TAB SAB Ect Mult Living                  Review of Systems Ten systems reviewed and are negative for acute change, except as noted in the HPI.   Allergies  Review of patient's allergies indicates no known allergies.  Home Medications   Current Outpatient Rx  Name  Route  Sig  Dispense  Refill  . AMLODIPINE BESYLATE 10 MG PO TABS   Oral   Take 10 mg by mouth daily.         Marland Kitchen VITAMIN D3 1000 UNITS PO TABS   Oral   Take 1,000 Units by mouth daily.         Marland Kitchen LISINOPRIL 20 MG PO TABS   Oral   Take 1 tablet (20 mg total) by mouth daily.   30 tablet   2   . OMEGA-3-ACID ETHYL ESTERS 1 G PO CAPS   Oral  Take 1 g by mouth 2 (two) times daily.         Marland Kitchen PANTOPRAZOLE SODIUM 40 MG PO TBEC   Oral   Take 1 tablet (40 mg total) by mouth daily.   30 tablet   0     BP 144/105  Pulse 60  Temp 98.6 F (37 C) (Oral)  Resp 19  SpO2 99%  Physical Exam Physical Exam  Nursing note and vitals reviewed. Constitutional: She is oriented to person, place, and time. She appears well-developed and well-nourished. No distress.  HENT:  Head: Normocephalic and atraumatic.  Eyes: Conjunctivae normal and EOM are normal. Pupils are equal, round, and reactive to light. No scleral icterus.  Neck: Normal range of motion.  Cardiovascular: Normal rate, regular rhythm and normal heart sounds.  Exam reveals no gallop and no friction rub.   No murmur heard. Pulmonary/Chest: Effort normal and breath sounds normal. No respiratory distress. Tenderness to palpation of the left pectoral wall and pectoralis insertions.  Very tender to palpation of the left  rhomboids.  Pain is worsened with movement of the shoulder and scapula. Abdominal: Soft. Bowel sounds are normal. She exhibits no distension and no mass. There is no tenderness. There is no guarding.  Neurological: She is alert and oriented to person, place, and time.  Skin: Skin is warm and dry. She is not diaphoretic.    ED Course  Procedures (including critical care time)  Labs Reviewed  COMPREHENSIVE METABOLIC PANEL - Abnormal; Notable for the following:    Creatinine, Ser 1.35 (*)     GFR calc non Af Amer 42 (*)     GFR calc Af Amer 49 (*)     All other components within normal limits  CBC  POCT I-STAT TROPONIN I   No results found.   1. Chest pain   2. SOB (shortness of breath)   3. Cocaine use       MDM  12:49 PM BP 144/105  Pulse 60  Temp 98.6 F (37 C) (Oral)  Resp 19  SpO2 99% Patient with what appears to be chest wall pain versus atypical cardiac chest pain.  Do not suspect ACS as origin of her complaint today.  Her pain is reproducible with palpation of the muscular tissue.  Perform workup for acute coronary syndrome.    1:55 PM Patient with bibasilar pleural effusions.  Per medical records she has been c/o worsening SOB to here PCP although she denies this today.     3:50 PM Patent continues to have chest pain and back pain.  UDS is positive for cocaine. Will call for admit to obs . Second troponin is pending.   Patient has been accepted for admission from family Medicine.    Arthor Captain, PA-C 01/22/13 2148

## 2013-01-18 NOTE — Assessment & Plan Note (Signed)
Since pt has new active chest pain with radiation down her arm, we have called EMS and she will be transported emergently to the [facility] ER for evaluation and management. 12-lead EKG here in the office shows NSR without STEMI or arrhythmia. Prior to EMS arrival pt was given ASA 325mg  and nitroglycerin 0.4mg  SL x1 without improvement in pain. Precepted patient with Dr. Jennette Kettle, who agrees with this plan.

## 2013-01-18 NOTE — Progress Notes (Signed)
Patient ID: Jennifer Moss, female   DOB: 1951/01/13, 62 y.o.   MRN: [id number]  CC: Jennifer Moss is a 62 y.o. female here to follow up on chest pain and hypertension.  HPI:  Chest pain: Pt is actively having chest pain at this time, with new constant pain in her left shoulder radiating down her left arm which began last night. Has been SOB for the last 2-3 weeks which is worsening, also has had some chest pain on exertion which lasts 5-10 minutes at a time. The arm pain is new and constant. It is worse with movement of her arm and with deep breaths. Had EKG at visit one week ago which showed no arrhythmia but did show prolonged QT.   Leg pain/knots: Has had painful knots in her legs. 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 She says the pain she is having feels similar to how it did at that time when they found the tumor. Her left leg is mainly affected, but she felt a new knot in her R leg last night.  PHYSICAL EXAM: BP 141/94  Pulse 65  Temp 98 F (36.7 C) (Oral)  Ht 5' (1.524 m)  Wt 138 lb 6.4 oz (62.778 kg)  BMI 27.03 kg/m2 Gen: appears uncomfortable, mild distress Lungs: rhonchorus sounds throughout, mild increased WOB Neuro: grossly nonfocal, speech intact  ASSESSMENT/PLAN:  Chest pain: Since pt has new active chest pain with radiation down her arm, we have called EMS and she will be transported emergently to the [facility] ER for evaluation and management. 12-lead EKG here in the office shows NSR without STEMI or arrhythmia. Prior to EMS arrival pt was given ASA 325mg  and nitroglycerin 0.4mg  SL x1 without improvement in pain. Precepted patient with Dr. Jennette Kettle, who agrees with this plan.   Will need to address blood pressure, leg pain/knots at a later visit, as the most pressing item at this time is pt's active chest pain.

## 2013-01-18 NOTE — H&P (Signed)
Family Medicine Teaching [facility] Admission History and Physical  Patient name: Jennifer Moss Medical record number: [id number] Date of birth: 1951-01-26 Age: 62 y.o. Gender: female  Primary Care Provider: Levert Feinstein, MD  Chief Complaint: Chest Pain History of Present Illness: Jennifer Moss is a 62 y.o. year old female with a PMH of cocaine abuse, cigarette smoking, HTN and prolonged QT interval who presented to the [facility] today for a regular follow up and noted that she had chest pain. Therefore, she was sent to the ED for evaluation. She provides two different accounts of chest pain. One is intermittent over several months and last 5-10 minutes and resolves spontaneously. The other she states that the pain started yesterday while she was mopping her kitchen. It started on the left side of her chest and radiated to the left shoulder and down the arm. She notes it was a 10/10. It was equal at rest and while moving about the house. She denies associated nausea, vomiting or sweating. Denies any fall or trauma. Did not take anything to relieve it. In family center aspirin she was given ASA 325 and Nitro. She was also given nitro in the ambulance x 1. She states that this reduced chest pain, but not her left shoulder pain.   The patient has a history of cocaine use and stated the last time that she used was last Thursday. Other risk factors for CAD include HTN and cigarette smoking.   Patient Active Problem List  Diagnosis  . Diverticulitis  . Mallory-Weiss tear  . Rectal bleeding  . Hypertension  . Drug abuse  . Horseshoe kidney  . Chronic kidney disease, stage 3  . Prolonged Q-T interval on ECG  . Leg pain  . Chest pain  . Mass of soft tissue  . Tobacco abuse   Past Medical History: Past Medical History  Diagnosis Date  . Hypertension   . Hyperlipemia   . Anginal pain   . Shortness of breath   . Chronic kidney disease     rt kidney horseshoe  .  Arthritis   . Retinal detachment     R eye, per patient  . Mallory-Weiss tear   . Diverticulitis   . Rectal bleed   . Drug abuse     Past Surgical History: Past Surgical History  Procedure Date  . Diagnostic laparoscopy     for horseshoe kidney  . Appendectomy   . Lipoma removal     in 1990s had an egg-sized tumor removed from her leg, she thinks it was a lipoma  . Repair of detached retina   . Esophagogastroduodenoscopy 12/17/2012    Procedure: ESOPHAGOGASTRODUODENOSCOPY (EGD);  Surgeon: Graylin Shiver, MD;  Location[facility] ENDOSCOPY;  Service: Endoscopy;  Laterality: N/A;    Social History: History   Social History  . Marital Status: Legally Separated    Spouse Name: N/A    Number of Children: N/A  . Years of Education: N/A   Social History Main Topics  . Smoking status: Current Some Day Smoker    Types: Cigarettes  . Smokeless tobacco: Never Used  . Alcohol Use: Yes     Comment: occ  . Drug Use: 3 per week    Special: Marijuana     Comment: cocaine and THC + in UDS in November 2013  . Sexually Active: No   Other Topics Concern  . None   Social History Narrative  . None    Family History: History  reviewed. No pertinent family history.  Allergies: No Known Allergies  No current facility-administered medications for this encounter.   Current Outpatient Prescriptions  Medication Sig Dispense Refill  . amLODipine (NORVASC) 10 MG tablet Take 10 mg by mouth daily.      . cholecalciferol (VITAMIN D) 1000 UNITS tablet Take 1,000 Units by mouth daily.      Marland Kitchen lisinopril (PRINIVIL,ZESTRIL) 20 MG tablet Take 1 tablet (20 mg total) by mouth daily.  30 tablet  2  . omega-3 acid ethyl esters (LOVAZA) 1 G capsule Take 1 g by mouth 2 (two) times daily.      . pantoprazole (PROTONIX) 40 MG tablet Take 1 tablet (40 mg total) by mouth daily.  30 tablet  0   Review Of Systems: Per HPI with the following additions: none Otherwise 12 point review of systems was performed and  was unremarkable.  Physical Exam: Temp:  [98 F (36.7 C)-98.6 F (37 C)] 98.6 F (37 C) (01/21 1137) Pulse Rate:  [55-74] 61  (01/21 1600) Resp:  [13-30] 13  (01/21 1600) BP: (125-166)/(91-111) 137/96 mmHg (01/21 1600) SpO2:  [96 %-100 %] 97 % (01/21 1600) Weight:  [138 lb 6.4 oz (62.778 kg)] 138 lb 6.4 oz (62.778 kg) (01/21 0945)   General: alert, cooperative, appears stated age and no distress HEENT: PERRLA, extra ocular movement intact, sclera clear, anicteric and oropharynx clear, no lesions Heart: S1, S2 normal, no murmur, rub or gallop, regular rate and rhythm Lungs: clear to auscultation, no wheezes or rales and unlabored breathing Abdomen: abdomen is soft without significant tenderness, masses, organomegaly or guarding Extremities: extremities normal, atraumatic, no cyanosis or edema MSK: very TTP along spine of left scapula; positive neer's, hawkins, and external rotation of left shoulder Skin:no rashes Neurology: normal without focal findings, mental status, speech normal, alert and oriented x3 and PERLA  Labs and Imaging:  Results for orders placed during the hospital encounter of 01/18/13 (from the past 24 hour(s))  CBC     Status: Normal   Collection Time   01/18/13 11:53 AM      Component Value Range   WBC 9.6  4.0 - 10.5 K/uL   RBC 4.86  3.87 - 5.11 MIL/uL   Hemoglobin 13.4  12.0 - 15.0 g/dL   HCT 81.1  91.4 - 78.2 %   MCV 82.3  78.0 - 100.0 fL   MCH 27.6  26.0 - 34.0 pg   MCHC 33.5  30.0 - 36.0 g/dL   RDW 95.6  21.3 - 08.6 %   Platelets 294  150 - 400 K/uL  COMPREHENSIVE METABOLIC PANEL     Status: Abnormal   Collection Time   01/18/13 11:53 AM      Component Value Range   Sodium 141  135 - 145 mEq/L   Potassium 4.3  3.5 - 5.1 mEq/L   Chloride 104  96 - 112 mEq/L   CO2 26  19 - 32 mEq/L   Glucose, Bld 86  70 - 99 mg/dL   BUN 15  6 - 23 mg/dL   Creatinine, Ser 5.78 (*) 0.50 - 1.10 mg/dL   Calcium 9.7  8.4 - 46.9 mg/dL   Total Protein 6.5  6.0 - 8.3  g/dL   Albumin 3.5  3.5 - 5.2 g/dL   AST 15  0 - 37 U/L   ALT 15  0 - 35 U/L   Alkaline Phosphatase 80  39 - 117 U/L   Total Bilirubin 0.3  0.3 -  1.2 mg/dL   GFR calc non Af Amer 42 (*) >90 mL/min   GFR calc Af Amer 49 (*) >90 mL/min  POCT I-STAT TROPONIN I     Status: Normal   Collection Time   01/18/13 12:08 PM      Component Value Range   Troponin i, poc 0.00  0.00 - 0.08 ng/mL   Comment 3           URINE RAPID DRUG SCREEN (HOSP PERFORMED)     Status: Abnormal   Collection Time   01/18/13  2:23 PM      Component Value Range   Opiates NONE DETECTED  NONE DETECTED   Cocaine POSITIVE (*) NONE DETECTED   Benzodiazepines NONE DETECTED  NONE DETECTED   Amphetamines NONE DETECTED  NONE DETECTED   Tetrahydrocannabinol NONE DETECTED  NONE DETECTED   Barbiturates NONE DETECTED  NONE DETECTED  URINALYSIS, ROUTINE W REFLEX MICROSCOPIC     Status: Abnormal   Collection Time   01/18/13  2:23 PM      Component Value Range   Color, Urine YELLOW  YELLOW   APPearance CLEAR  CLEAR   Specific Gravity, Urine 1.012  1.005 - 1.030   pH 5.5  5.0 - 8.0   Glucose, UA NEGATIVE  NEGATIVE mg/dL   Hgb urine dipstick TRACE (*) NEGATIVE   Bilirubin Urine NEGATIVE  NEGATIVE   Ketones, ur NEGATIVE  NEGATIVE mg/dL   Protein, ur NEGATIVE  NEGATIVE mg/dL   Urobilinogen, UA 0.2  0.0 - 1.0 mg/dL   Nitrite NEGATIVE  NEGATIVE   Leukocytes, UA NEGATIVE  NEGATIVE  URINE MICROSCOPIC-ADD ON     Status: Abnormal   Collection Time   01/18/13  2:23 PM      Component Value Range   Squamous Epithelial / LPF MANY (*) RARE   WBC, UA 0-2  <3 WBC/hpf   RBC / HPF 0-2  <3 RBC/hpf   Bacteria, UA RARE  RARE  TROPONIN I     Status: Normal   Collection Time   01/18/13  2:56 PM      Component Value Range   Troponin I <0.30  <0.30 ng/mL    Dg Chest 2 View  01/18/2013  *RADIOLOGY REPORT*  Clinical Data: Chest pain.  Hypertension.  Cough.  CHEST - 2 VIEW  Comparison: 12/15/2012  Findings: Blunting of both costophrenic angles  noted.  Mild thoracic spondylosis is present.  Mild cardiomegaly noted without edema.  There is tortuosity of the thoracic aorta.  IMPRESSION:  1.  Blunting of both costophrenic angles suggesting small pleural effusions.  Cardiomegaly is present, without edema. 2.  Tortuous aorta. 3.  Mild thoracic spondylosis.   Original Report Authenticated By: Gaylyn Rong, M.D.     Date: 01/18/2013  Rate: 68  Rhythm: normal sinus rhythm  QRS Axis: normal  Intervals: normal  ST/T Wave abnormalities: normal  Conduction Disutrbances:none  Narrative Interpretation: no evidence of ischemia  Old EKG Reviewed: unchanged      Assessment and Plan: Jennifer Moss is a 62 y.o. year old female presenting with chest pain that is concerning for coronary vasospasm given her recent cocaine use. She also has shoulder pain that appears to be separate MSK issue.   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, I favor rotator cuff injury vs bursitis vs cervical spine radiculopathy - Give tylenol PRN, avoid NSAIDS - Encourage outpatient eval  # Cocaine Abuse - Persistent behavior that patient explains to me that she does recreationally by herself and does not need assistance with stopping but would like to stop; likely cause of chest pain; Daughter who lives at home not aware of her habit - Avoid beta blockers - Consult to social work for resources for addiction  # Tobacco  Abuse - Avoid nicotine patch in setting of possible ACS  # HTN - Cont home lisinopril and amlodipine   FENGI - Regular diet; Cont home protonix PPX - Heparin SQ DISPO - Admit to [facility] Medicine Teaching Service   Si Raider. Clinton Sawyer, MD, MBA 01/18/2013, 4:46 PM Family Medicine Resident, PGY-2 [phone] pager

## 2013-01-19 DIAGNOSIS — S4992XA Unspecified injury of left shoulder and upper arm, initial encounter: Secondary | ICD-10-CM | POA: Diagnosis present

## 2013-01-19 HISTORY — DX: Unspecified injury of left shoulder and upper arm, initial encounter: S49.92XA

## 2013-01-19 LAB — LIPID PANEL
LDL Cholesterol: 99 mg/dL (ref 0–99)
Triglycerides: 151 mg/dL — ABNORMAL HIGH (ref <150)
VLDL: 30 mg/dL (ref 0–40)

## 2013-01-19 LAB — HEMOGLOBIN A1C
Hgb A1c MFr Bld: 6.2 % — ABNORMAL HIGH (ref <5.7)
Mean Plasma Glucose: 131 mg/dL — ABNORMAL HIGH (ref <117)

## 2013-01-19 LAB — TROPONIN I: Troponin I: <0.3 ng/mL (ref <0.30)

## 2013-01-19 MED ORDER — ASPIRIN EC 81 MG PO TBEC
81.0000 mg | DELAYED_RELEASE_TABLET | Freq: Every day | ORAL | Status: DC
  Administered 2013-01-19: 81 mg via ORAL
  Filled 2013-01-19: qty 1

## 2013-01-19 MED ORDER — SIMVASTATIN 40 MG PO TABS: 40.0000 mg | ORAL_TABLET | Freq: Every evening | ORAL | Status: DC

## 2013-01-19 MED ORDER — ASPIRIN 81 MG PO TBEC: 81.0000 mg | DELAYED_RELEASE_TABLET | Freq: Every day | ORAL | Status: AC

## 2013-01-19 NOTE — Discharge Summary (Signed)
I examined this patient and discussed the care plan with Dr Williamson and the FPTS team and agree with assessment and plan as documented in the progress note above.  

## 2013-01-19 NOTE — Progress Notes (Signed)
Utilization review completed.  

## 2013-01-19 NOTE — Progress Notes (Signed)
PCP Social Visit Note (late entry)  Stopped by to see Jennifer Moss around 2pm today. She states understanding of how important it is for her to abstain from cocaine use. I will look forward to seeing her in the family medicine clinic for hospital follow up. Appreciate excellent care of Family Medicine Teaching Service.  Levert Feinstein, MD Family Medicine PGY-1

## 2013-01-19 NOTE — Progress Notes (Signed)
I examined this patient and discussed the care plan with Dr Williamson and the FPTS team and agree with assessment and plan as documented in the progress note above.  

## 2013-01-19 NOTE — Progress Notes (Signed)
Family Medicine Teaching Service Daily Progress Note Service Page: [phone]   Subjective: Patient denies any acute events over night. She says her chest pain has resolved and she denies palpitations, n/v, dizziness, vision changes. She complains of ongoing L shoulder pain that is slightly improving.    Objective: Temp:  [98 F (36.7 C)-98.6 F (37 C)] 98.5 F (36.9 C) (01/22 0558) Pulse Rate:  [55-74] 60  (01/22 0558) Resp:  [13-30] 16  (01/22 0558) BP: (121-166)/(85-111) 146/94 mmHg (01/22 0558) SpO2:  [96 %-100 %] 100 % (01/22 0558) Weight:  [134 lb 9.6 oz (61.054 kg)-138 lb 6.4 oz (62.778 kg)] 134 lb 9.6 oz (61.054 kg) (01/21 2023) Exam: General: Patient was walking around her room comfortably, no acute distress.  Cardiovascular: RRR, S1 and S2 normal. No murmurs, rubs or gallops.  Respiratory: Clear to auscultation bilaterally Abdomen: Normal bowel sounds Extremities: no LE swelling. Patient is tender to palpation on the Left anterior chest wall, left scapula, and left shoulder. She has normal ROM however.    I have reviewed the patient's medications, labs, imaging, and diagnostic testing.  Notable results are summarized below.  CBC BMET   Lab 01/18/13 2119 01/18/13 1153  WBC 8.3 9.6  HGB 12.8 13.4  HCT 39.9 40.0  PLT 273 294    Lab 01/18/13 2119 01/18/13 1153  NA -- 141  K -- 4.3  CL -- 104  CO2 -- 26  BUN -- 15  CREATININE 1.51* 1.35*  GLUCOSE -- 86  CALCIUM -- 9.7      Patient Assessment:  Jennifer Moss is a 62 y.o. year old female presenting with chest pain that is concerning for coronary vasospasm given her recent cocaine use. She also has shoulder pain that appears to be separate MSK issue.   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, I favor rotator cuff injury vs bursitis vs cervical spine radiculopathy  - Give tylenol PRN, avoid NSAIDS  - Encourage outpatient eval   # Cocaine Abuse - Persistent behavior that patient explains to me that she does recreationally by herself and does not need assistance with stopping but would like to stop; likely cause of chest pain; Daughter who lives at home not aware of her habit  - Avoid beta blockers  - Consult to social work for resources for addiction   # Tobacco Abuse  - Avoid nicotine patch in setting of possible ACS   # HTN  - Cont home lisinopril and amlodipine   FEN/GI: Cardiac diet PPx: Sub-q Heparin Dispo: Discharge today   Si Raider. Clinton Sawyer, MD, MBA 01/19/2013, 1:57 PM Family Medicine Resident, PGY-2 [phone] pager

## 2013-01-19 NOTE — Progress Notes (Signed)
Reviewed discharge instructions with patient, she stated her understanding.  Confirmed ok to discharge patient with elevated blood pressure with MD, MD states they will monitor BP in clinic.  Jennifer Moss

## 2013-01-19 NOTE — Clinical Social Work Psychosocial (Signed)
     Clinical Social Work Department BRIEF PSYCHOSOCIAL ASSESSMENT 01/19/2013  Patient:  Jennifer Moss, Jennifer Moss     Account Number:  [id number]     Admit date:  01/18/2013  Clinical Social Worker:  Margaree Mackintosh  Date/Time:  01/19/2013 12:00 M  Referred by:  Physician  Date Referred:  01/19/2013 Referred for  Substance Abuse   Other Referral:   Interview type:  Patient Other interview type:    PSYCHOSOCIAL DATA Living Status:  ALONE Admitted from facility:   Level of care:   Primary support name:  Boneta Lucks: [phone] Primary support relationship to patient:  FAMILY Degree of support available:   Grandmother/Unknown.    CURRENT CONCERNS Current Concerns  Substance Abuse   Other Concerns:    SOCIAL WORK ASSESSMENT / PLAN Clinical Social Worker recieved referral indicating pt is currently requesting information on ceasing substance use. CSW reviewed chart and met with pt at bedside.  CSW introduced self, explained role, and provided support.  CSW provided active listening and completed SBIRT-please see flowsheet for additional information.  Pt confirmed interest in resource information.  CSW provided opportunity for questions/concerns to be addressed.  CSW utilized strengths based approach to assist pt with identifying inherent strengths and coping skills.   Assessment/plan status:  Information/Referral to [facility] Other assessment/ plan:   Information/referral to community resources:   Community Substance Abuse.    PATIENTS/FAMILYS RESPONSE TO PLAN OF CARE: Pt was pleasant and engaged in conversation.  Pt was receptive to intervention and resources.

## 2013-01-19 NOTE — Discharge Summary (Signed)
Physician Discharge Summary  Patient ID: Jennifer Moss MRN: [id number] DOB/AGE: 60/15/1952 60 y.o.  Admit date: 01/18/2013 Discharge date: 01/19/2013  Admission Diagnoses: Chest pain concerning for ACS   Discharge Diagnoses:  Active Problems:  Hypertension  Drug abuse  Chest pain  Injury of left shoulder   Discharged Condition: stable  Hospital Course:  Jennifer Moss is a 62 y.o. year old female presenting with chest pain of 2 days duration that is concerning for coronary vasospasm given her recent cocaine use. She also has shoulder pain that appears to be separate MSK issue.   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 Jennette Kettle vs. Myoview with Logan) - Risk modification - adherence to statin, ASA, anti HTN meds - Cocaine Cessation   Consults: None  Significant Diagnostic Studies:   EKG - NSR x  2 Trop I < 0.30 X 2 TSH WNL Lipid Panel     Component Value Date/Time   CHOL 183 01/19/2013 0303   TRIG 151* 01/19/2013 0303   HDL 54 01/19/2013 0303   CHOLHDL 3.4 01/19/2013 0303   VLDL 30 01/19/2013 0303   LDLCALC 99 01/19/2013 0303       Discharge Exam: Blood pressure 147/100, pulse 63, temperature 98.5 F (36.9 C), temperature source Oral, resp. rate 16, height 5' (1.524 m), weight 134 lb 9.6 oz (61.054 kg), SpO2 100.00%.  General: alert, cooperative, appears stated age and no distress  HEENT: PERRLA, extra ocular movement intact, sclera clear, anicteric and oropharynx clear, no lesions  Heart: S1, S2 normal, no murmur, rub or gallop, regular rate and rhythm  Lungs: clear to auscultation, no wheezes or rales and unlabored breathing  Abdomen: abdomen is soft without significant tenderness, masses, organomegaly or guarding  Extremities: extremities normal, atraumatic, no cyanosis or edema  MSK: very TTP along spine of left scapula; positive neer's, hawkins, and external rotation of left shoulder  Skin:no rashes  Neurology: normal without focal findings, mental status, speech normal, alert and oriented x3 and PERLA   Disposition: 01-Home or Self Care      Discharge Orders    Future Appointments: Provider: Department: Dept Phone: Center:   01/28/2013 1:30 PM Latrelle Dodrill, MD [facility] [phone] [facility]     Future Orders Please Complete By Expires   Discharge patient  Medication List     As of 01/19/2013  2:15 PM    TAKE these medications         amLODipine 10 MG tablet   Commonly known as: NORVASC   Take 10 mg by mouth daily.      aspirin 81 MG EC tablet   Take 1 tablet (81 mg total) by mouth daily.      cholecalciferol 1000 UNITS tablet   Commonly known as: VITAMIN D   Take 1,000 Units by mouth daily.      lisinopril 20 MG tablet   Commonly known as: PRINIVIL,ZESTRIL   Take 1 tablet (20 mg total) by mouth daily.       omega-3 acid ethyl esters 1 G capsule   Commonly known as: LOVAZA   Take 1 g by mouth 2 (two) times daily.      pantoprazole 40 MG tablet   Commonly known as: PROTONIX   Take 1 tablet (40 mg total) by mouth daily.      simvastatin 40 MG tablet   Commonly known as: ZOCOR   Take 1 tablet (40 mg total) by mouth every evening.         Follow-up Information    Follow up with Levert Feinstein, MD. On 01/28/2013. (1:30 PM)    Contact information:   [address] [place] [address] [phone]          Signed: Mat Carne 01/19/2013, 2:15 PM

## 2013-01-19 NOTE — H&P (Signed)
I examined this patient and discussed the care plan with Dr Clinton Sawyer and the [facility] team and agree with assessment and plan as documented in the admission note above. I agree with the differential for the left shoulder pain, and advised her that she can be treated at our office if the pain becomes more problematic. I reinforced the need to avoid cocaine due to vascular spasm related complications.

## 2013-01-23 NOTE — ED Provider Notes (Signed)
Medical screening examination/treatment/procedure(s) were conducted as a shared visit with non-physician practitioner(s) and myself.  I personally evaluated the patient during the encounter  Derwood Kaplan, MD 01/23/13 1554

## 2013-01-28 ENCOUNTER — Ambulatory Visit (INDEPENDENT_AMBULATORY_CARE_PROVIDER_SITE_OTHER): Payer: Self-pay | Admitting: Family Medicine

## 2013-01-28 ENCOUNTER — Inpatient Hospital Stay: Payer: Self-pay | Admitting: Family Medicine

## 2013-01-28 ENCOUNTER — Encounter: Payer: Self-pay | Admitting: Family Medicine

## 2013-01-28 VITALS — BP 118/84 | HR 65 | Ht 60.0 in | Wt 134.0 lb

## 2013-01-28 DIAGNOSIS — K5900 Constipation, unspecified: Secondary | ICD-10-CM

## 2013-01-28 DIAGNOSIS — Z0000 Encounter for general adult medical examination without abnormal findings: Secondary | ICD-10-CM

## 2013-01-28 DIAGNOSIS — R079 Chest pain, unspecified: Secondary | ICD-10-CM

## 2013-01-28 DIAGNOSIS — Q8501 Neurofibromatosis, type 1: Secondary | ICD-10-CM

## 2013-01-28 HISTORY — DX: Constipation, unspecified: K59.00

## 2013-01-28 MED ORDER — POLYETHYLENE GLYCOL 3350 17 GM/SCOOP PO POWD: 17.0000 g | Freq: Every day | ORAL | Status: DC

## 2013-01-28 NOTE — Assessment & Plan Note (Signed)
I have received patients records from [facility] and will go through them. Briefly looking through them today, it appears as though she has not had a pap in several years. Will plan for pt to return in one month for complete physical and to address health maintenance items. She may require referral to [facility] for screening colonoscopy since she has no payer source and the [employer] will not cover colonoscopy.

## 2013-01-28 NOTE — Patient Instructions (Addendum)
It was great to see you again today!  I'm glad you are feeling better.  For your heart: I am going to call Dr. Mayford Knife to see what she recommends regarding a stress test. I will get back in touch with you about this next week. Please call our clinic if you haven't heard from me by Wednesday 2/5.  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 For now it's best for [place] to focus on your heart, but if you have worsening pain in your leg, or if you have any weakness in your leg, come back for a clinic visit so we can address this concern.  For cholesterol: Break the simvastatin pill in half so that you are taking 20mg  per day. Call if you have any problems with the medicine.  For constipation: you can try miralax over the counter.  Keep up the good work on staying off cocaine. This is best for you and your health! Come into the clinic or go to the ER if you have any chest pain that does not go away, shortness of breath, or any other concerns.  Schedule an appointment to be seen in about a month for a complete physical with a pap smear.  Be well, Dr. Pollie Meyer

## 2013-01-28 NOTE — Assessment & Plan Note (Addendum)
No further chest pain since discharge (just some post-exertion muscle tenderness that seems to be musculoskeletal). In the past her description of the pain, along with her age and gender placed her in an intermediate risk for coronary artery disease.  Our inpatient team recommended outpatient stress testing, but after speaking with patient, she confirms that she had an exercise stress test done about two years ago here in [place]. After searching through her EMR and discussing with preceptor (Dr. Mahala Menghini), we did find a record of a stress test which was normal, done by Dr. Armanda Magic. Would like to go ahead and refer pt to cardiology for further input, but patient currently has no payer source (has appointment February 24 to apply for the [facility]). In order to avoid additional charges which pt will not be able to pay, I will plan to get in touch with Dr. Mayford Knife by phone to ask if she would recommend additional stress testing for patient at this time.  Pt and preceptor both agree with this plan.  In the meantime, will continue to modify other risk factors by use of statin (will decrease to 20mg  daily as this is a safer dose with concurrent amlodipine use) and aspirin 81mg  daily. Encouraged continued cessation of cocaine abuse. Will not place patient on a beta blocker due to recent history of cocaine use.  Reminded patient of reasons to go to ER (persistent chest pain or SOB) - per AVS.

## 2013-01-28 NOTE — Assessment & Plan Note (Signed)
Recommended pt try OTC miralax. She has a hx of diverticulitis with possible microperforation back in December 2013, but has been adequately treated for this and is not having any pain or rectal bleeding, so it should be safe to use a laxative in her. Precepted with Dr. Mahala Menghini.

## 2013-01-28 NOTE — Assessment & Plan Note (Addendum)
Pt complains of painful lumps in her legs, similar to a tumor she previously had. 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 She confirms to me that she has a maternal aunt with a similar history of benign tumors. At office visit earlier this month, not knowing this history, we checked a CK and ESR which were both normal.  After discussion with Dr. Mahala Menghini, would like to have pt return to neurosurgery (previously saw Dr. Dutch Quint) to have these new lumps evaluated, but she has no current payer source. The most important issue that we tackle now is getting her cardiac risk reduced and addressed, so will hold off on neurosurgery referral given financial constraints. Patient agrees with this plan. Discussed with patient reasons to return (worsening of pain or weakness in legs).

## 2013-01-28 NOTE — Progress Notes (Signed)
Patient ID: Jennifer Moss, female   DOB: 08/22/1951, 62 y.o.   MRN: [id number]   CC: Jennifer Moss is a 62 y.o. female here to follow up on her hosptialization.  HPI:  Hospital follow up: She was hospitalized on 1/21 for chest pain, after presenting to the [facility] for an office visit and was complaining of chest pain with shortness of breath. Upon discharge from the hospital she was started on aspirin and simvastatin. She has not yet started the simvastatin because she had a question about the side effects. Since discharge she has not had any chest pain. Has had some dull musculoskeletal-type shoulder pain after activity (does not occur during activity). She is clear about this pain being more like muscle tenderness than chest pain. She has tried to start walking some but it's been cold outside. Denies any cocaine use since her discharge. Still get short of breath some when she is cleaning her house. She has been checking BP at home some. The only number she can recall is that one time she got 138/120 (although I question the validity of this reading.)  Leg pain: has some pain in the R side of her hip, but her main concern is the knots in her left leg. She says the tenderness in the knots is similar how it felt initially with the tumor she had removed many years ago. She does get some pins/tingling type feeling in her toes in the right food if she stands or walks for a long time. No leg swelling.   Constipation: feels like she might be getting constipated and wonders what OTC medicine she can try. No abdominal pain, rectal bleeding, or fever.  ROS: See HPI  PHYSICAL EXAM: BP 118/84  Pulse 65  Ht 5' (1.524 m)  Wt 134 lb (60.782 kg)  BMI 26.17 kg/m2 Gen: NAD, pleasant, cooperative Heart: RRR Lungs: CTAB, NWOB Ext: small palpable tender nodules that measure <1cm underlying skin along lateral area of left thigh Neuro: nonfocal, speech intact

## 2013-02-01 ENCOUNTER — Telehealth: Payer: Self-pay | Admitting: Family Medicine

## 2013-02-01 NOTE — Telephone Encounter (Signed)
Called patient to discuss need to return to cardiology. She did not answer, but I left voicemail for her to call back.   After discussion with Dr. Jennette Kettle, patient should be seen by cardiology for further evaluation, as a negative stress test two years ago does not rule out current coronary arter disease. Dr. Jennette Kettle would not be comfortable doing an exercise stress test on pt, she would best be served by seeing a cardiologist and likely needs a myoview stress test.  This will be difficult to orchestrate as patient has no payer source. I will recommend that she schedule an appointment to follow up with Dr. Mayford Knife. She has an appt to try and get the orange card, so if she qualifies for this, it is possible that another cardiologist will be able to see her (perhaps [facility] cardiology).  If patient calls back, please obtain a number from her at which I can reach her, so that I can call her back and speak to her about this.  Levert Feinstein, MD

## 2013-02-02 NOTE — Telephone Encounter (Signed)
Pt returned call and can be reached at [phone]

## 2013-02-02 NOTE — Telephone Encounter (Signed)
Called and spoke with patient. Advised that she follow up with Dr. Mayford Knife. This will be difficult due to financial constraints. We will likely have to wait for her to get the [employer] as a payer source. She understands the recommendation to go to cardiology.   Also reviewed with her that if she has chest pain or SOB to call the clinic or go to the ER.

## 2013-02-10 IMAGING — CT CT ABD-PELV W/ CM
1 of 6 series · 14 of 32 positions shown, 19 images · IV contrast (APPLIED)
Comparison: MRI abdomen dated 11/10/2008.

CLINICAL DATA: Abdominal pain, nausea/vomiting, leukocytosis,
rectal bleeding

CT ABDOMEN AND PELVIS WITH CONTRAST
TECHNIQUE: Multidetector CT imaging of the abdomen and pelvis was
performed following the standard protocol during bolus
administration of intravenous contrast.
Contrast: 100mL OMNIPAQUE IOHEXOL 300 MG/ML  SOLN

[Series 14: sagittals · axial · 0.67mm/px · z∈[+910,+1437]mm · 14 of 526 slices shown, 19 images]
[im 31/526  soft-tissue]
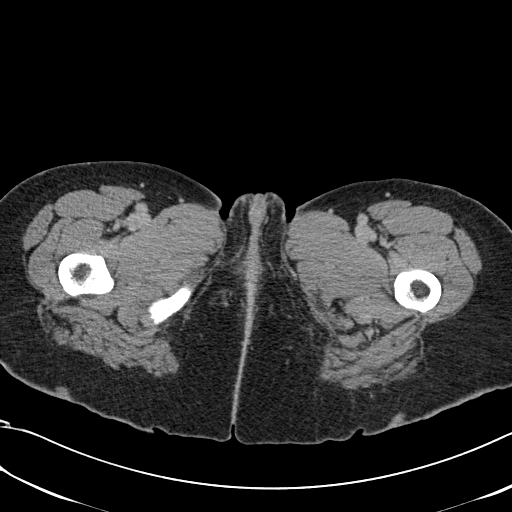
[im 31/526  bone]
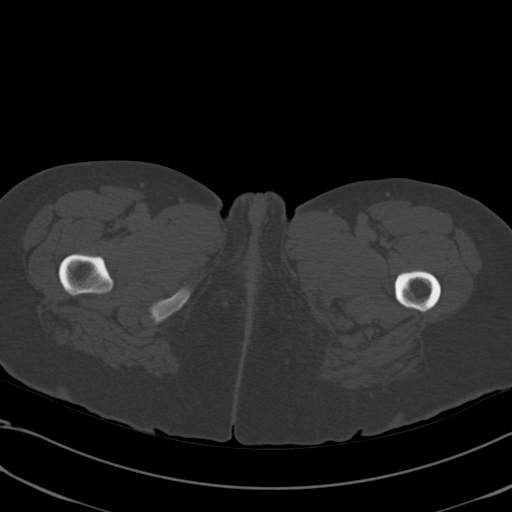
[im 62/526  soft-tissue]
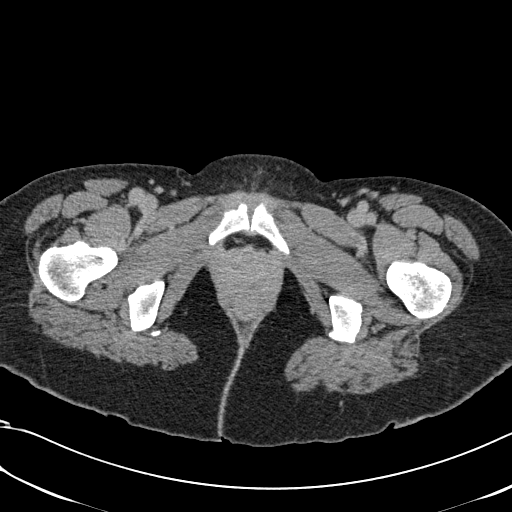
[im 124/526  soft-tissue]
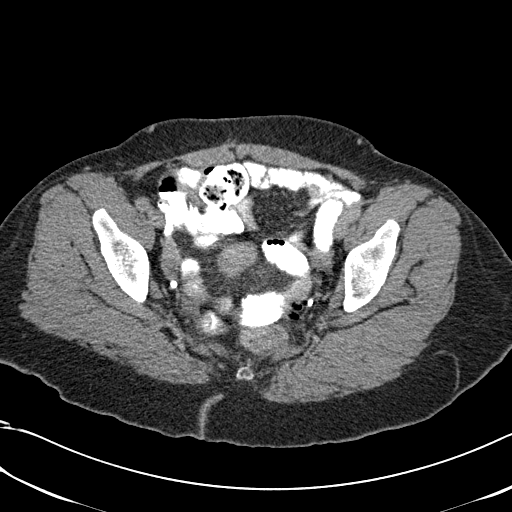
[im 155/526  soft-tissue]
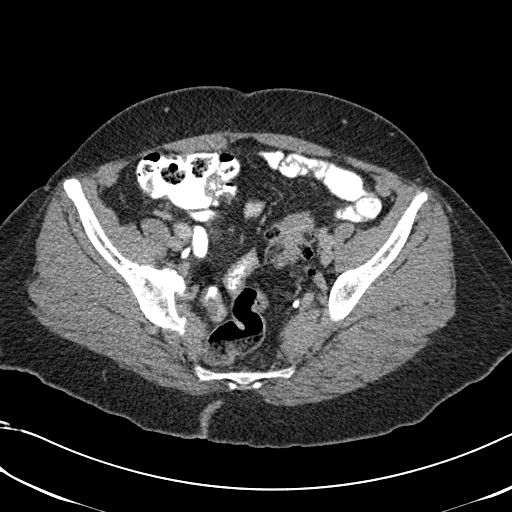
[im 186/526  soft-tissue]
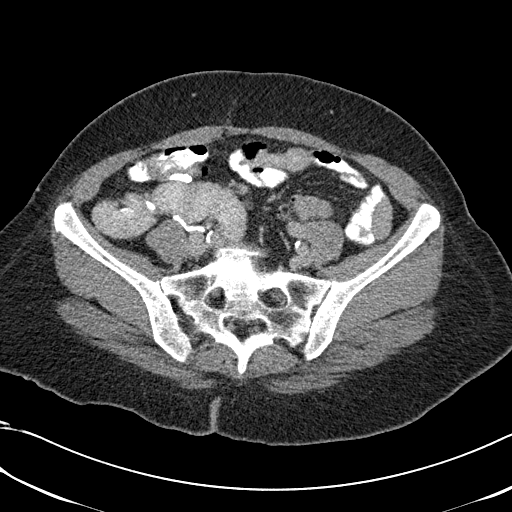
[im 217/526  soft-tissue]
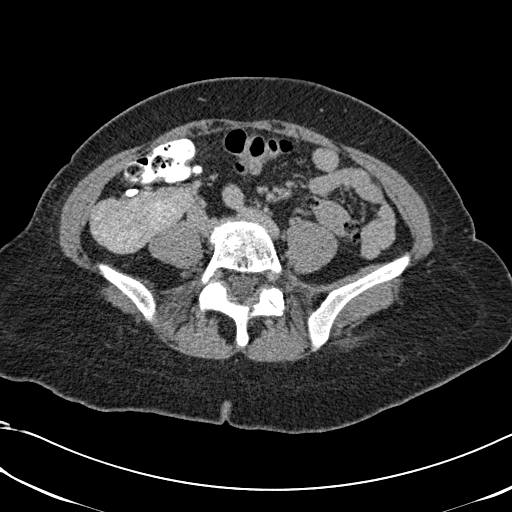
[im 278/526  soft-tissue]
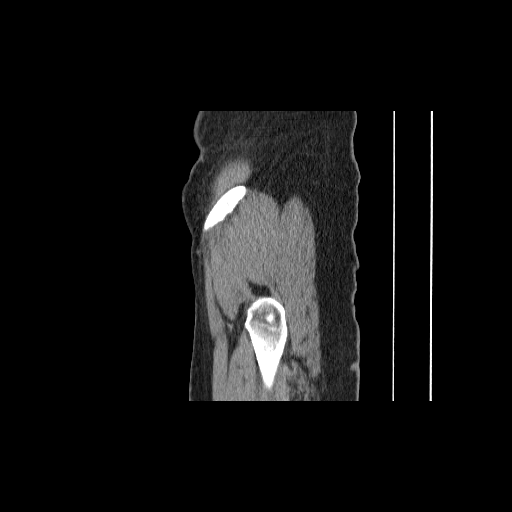
[im 278/526  lung]
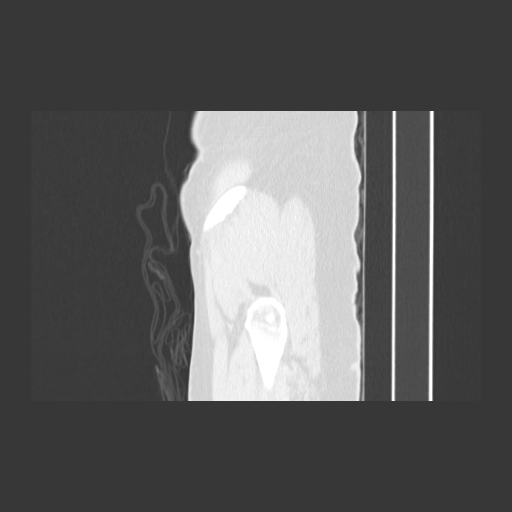
[im 309/526  soft-tissue]
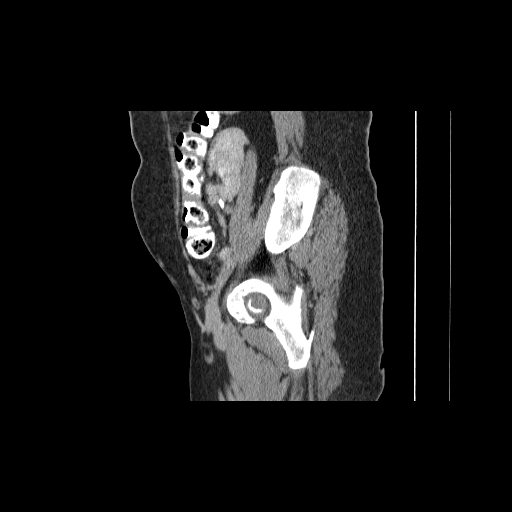
[im 340/526  soft-tissue]
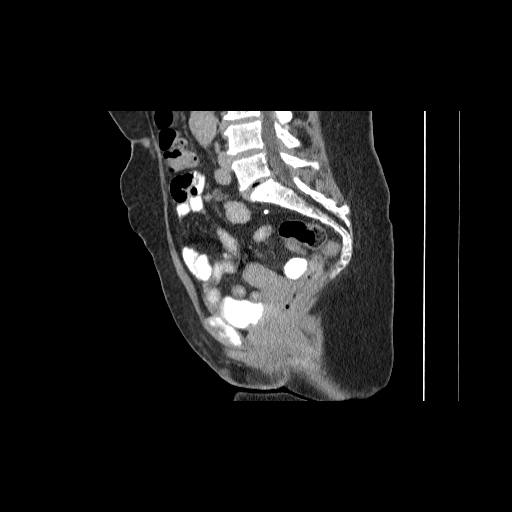
[im 340/526  bone]
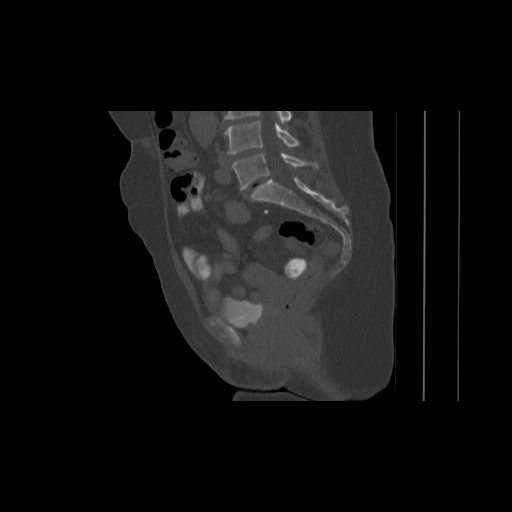
[im 371/526  soft-tissue]
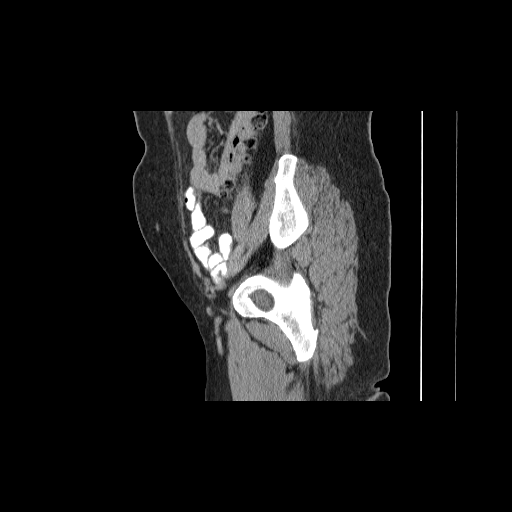
[im 402/526  soft-tissue]
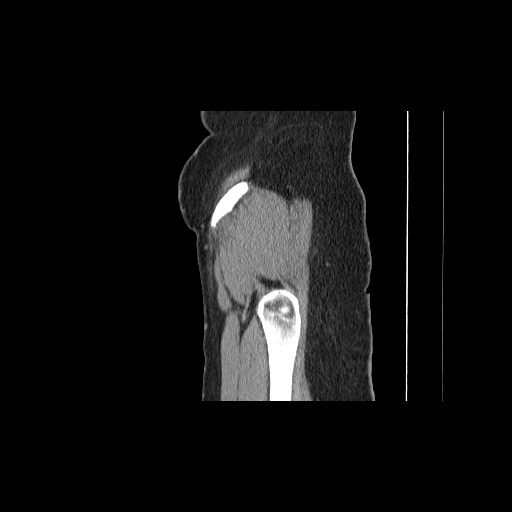
[im 433/526  lung]
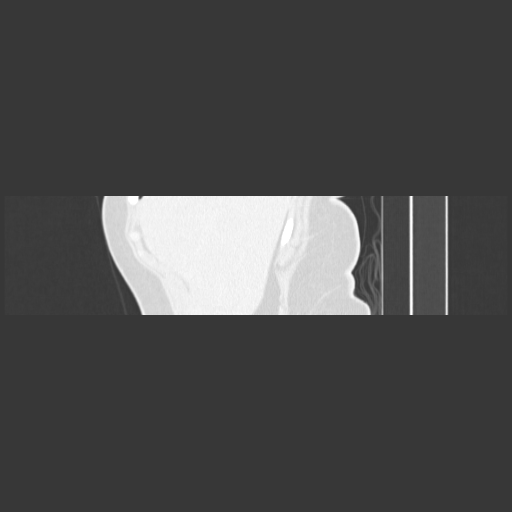
[im 464/526  soft-tissue]
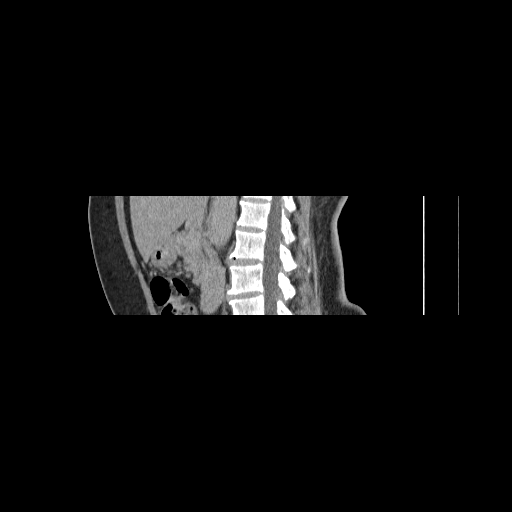
[im 464/526  lung]
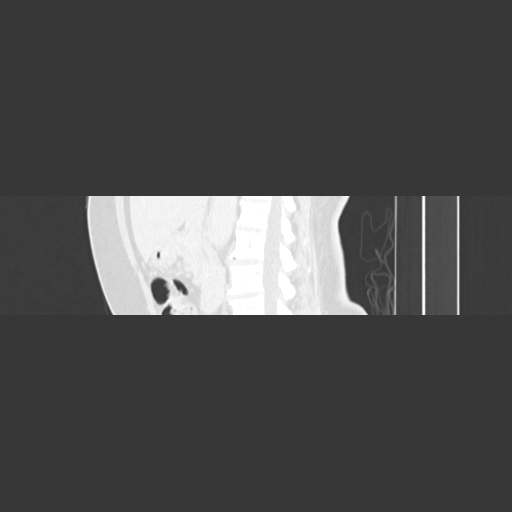
[im 495/526  soft-tissue]
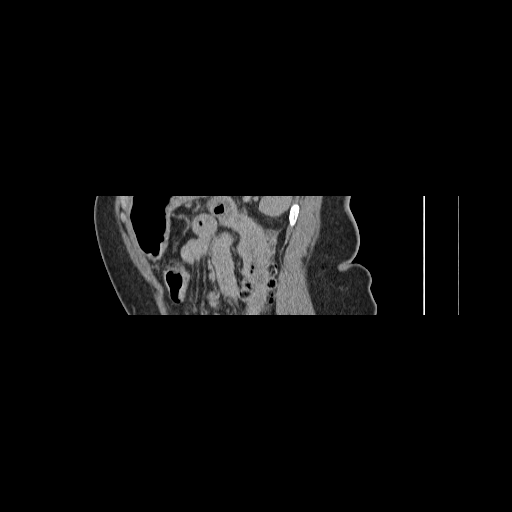
[im 495/526  lung]
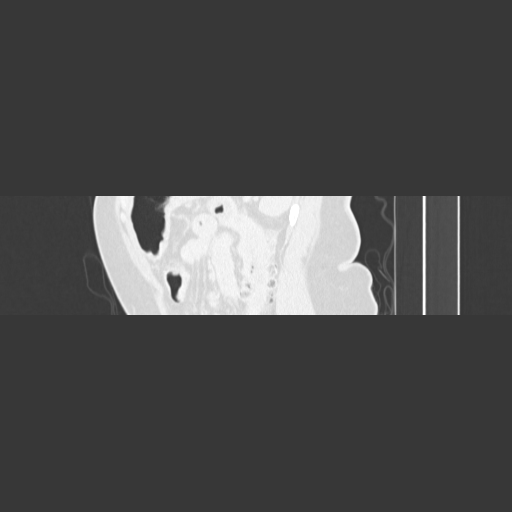

[14 of 32 positions shown; findings below may reference images not displayed]

FINDINGS: Motion degraded images due to patient vomiting during
imaging.

Mild linear scarring versus atelectasis in the lateral left lung
base.

Two stable cysts in the right hepatic lobe (series 11/images 12 and
16).

Spleen, pancreas, and adrenal glands are within normal limits.

Gallbladder is unremarkable.  No intrahepatic or extrahepatic
ductal dilatation.

Crossed fused renal ectopia with the left kidney in the right lower
abdomen/pelvis. No hydronephrosis.

No evidence of bowel obstruction.  Thick-walled loop of colon near
the splenic flexure (series 12/image 10).  Colonic diverticulosis
with associated pericolonic inflammatory changes in the left upper
abdomen (series 12/image 43), suggesting diverticulitis.

Tiny foci of gas adjacent to the descending colon are assumed to be
within a diverticula (series 11/image 12), although a localized
microperforation is also possible.  No drainable fluid collection
or abscess.  No free air.

No evidence of abdominal aortic aneurysm.

No abdominopelvic ascites.

No suspicious abdominopelvic lymphadenopathy.

Uterus and bilateral ovaries are unremarkable.

Bladder is within normal limits.

Degenerative changes of the visualized thoracolumbar spine.
IMPRESSION: Suspected descending colonic diverticulitis, as described above.
Small area of localized microperforation is possible but is not
favored.

No drainable fluid collection or abscess.  No free air.

Crossed fused renal ectopia.

## 2013-03-01 ENCOUNTER — Ambulatory Visit: Payer: Self-pay | Admitting: Family Medicine

## 2013-03-01 ENCOUNTER — Encounter: Payer: Self-pay | Admitting: Family Medicine

## 2013-04-01 ENCOUNTER — Ambulatory Visit (INDEPENDENT_AMBULATORY_CARE_PROVIDER_SITE_OTHER): Payer: No Typology Code available for payment source | Admitting: Family Medicine

## 2013-04-01 ENCOUNTER — Encounter: Payer: Self-pay | Admitting: Family Medicine

## 2013-04-01 ENCOUNTER — Other Ambulatory Visit (HOSPITAL_COMMUNITY)
Admission: RE | Admit: 2013-04-01 | Discharge: 2013-04-01 | Disposition: A | Discharge status: Home / Self Care | Payer: No Typology Code available for payment source | Source: Ambulatory Visit | Attending: Family Medicine | Admitting: Family Medicine

## 2013-04-01 VITALS — BP 107/84 | HR 75 | Temp 98.5°F | Ht 60.0 in | Wt 136.5 lb

## 2013-04-01 DIAGNOSIS — Z0000 Encounter for general adult medical examination without abnormal findings: Secondary | ICD-10-CM

## 2013-04-01 DIAGNOSIS — J45909 Unspecified asthma, uncomplicated: Secondary | ICD-10-CM

## 2013-04-01 DIAGNOSIS — F1910 Other psychoactive substance abuse, uncomplicated: Secondary | ICD-10-CM

## 2013-04-01 DIAGNOSIS — I10 Essential (primary) hypertension: Secondary | ICD-10-CM

## 2013-04-01 DIAGNOSIS — R079 Chest pain, unspecified: Secondary | ICD-10-CM

## 2013-04-01 DIAGNOSIS — F17200 Nicotine dependence, unspecified, uncomplicated: Secondary | ICD-10-CM

## 2013-04-01 DIAGNOSIS — Z01419 Encounter for gynecological examination (general) (routine) without abnormal findings: Secondary | ICD-10-CM | POA: Insufficient documentation

## 2013-04-01 DIAGNOSIS — Z124 Encounter for screening for malignant neoplasm of cervix: Secondary | ICD-10-CM

## 2013-04-01 DIAGNOSIS — Z720 Tobacco use: Secondary | ICD-10-CM

## 2013-04-01 DIAGNOSIS — Z1211 Encounter for screening for malignant neoplasm of colon: Secondary | ICD-10-CM

## 2013-04-01 DIAGNOSIS — Z1151 Encounter for screening for human papillomavirus (HPV): Secondary | ICD-10-CM | POA: Insufficient documentation

## 2013-04-01 DIAGNOSIS — Z23 Encounter for immunization: Secondary | ICD-10-CM

## 2013-04-01 MED ORDER — AMLODIPINE BESYLATE 10 MG PO TABS: 10.0000 mg | ORAL_TABLET | Freq: Every day | ORAL | Status: DC

## 2013-04-01 MED ORDER — LISINOPRIL 20 MG PO TABS: 20.0000 mg | ORAL_TABLET | Freq: Every day | ORAL | Status: DC

## 2013-04-01 MED ORDER — ALBUTEROL SULFATE HFA 108 (90 BASE) MCG/ACT IN AERS: 2.0000 | INHALATION_SPRAY | Freq: Four times a day (QID) | RESPIRATORY_TRACT | Status: DC | PRN

## 2013-04-01 MED ORDER — PRAVASTATIN SODIUM 40 MG PO TABS: 40.0000 mg | ORAL_TABLET | Freq: Every day | ORAL | Status: DC

## 2013-04-01 NOTE — Progress Notes (Signed)
Patient ID: Jennifer Moss, female   DOB: 1951/03/06, 62 y.o.   MRN: [id number]  HPI:  Ms. Gallery presents today for a well woman visit.   ROS: ROS sheet entirely negative. Does endorse some mild shortness of breath with exertion, also occasionally gets wheeze. Denies chest pain.  PMFSH: Endorses a history of asthma. Currently smokes 1 or 2 puffs of cigarettes "here or there" - about once per week Has history of smoking crack cocaine and is trying hard to stop. Has been going to cocaine support group. It's been hard because sh etakes the bus. When she feels the urge to smoke she prays, reads the Bible, or calls her sister for support. She does well if she stays active, but the times when she most wants to smoke crack is if she is bored at home.  PHYSICAL EXAM: BP 107/84  Pulse 75  Temp(Src) 98.5 F (36.9 C) (Oral)  Ht 5' (1.524 m)  Wt 136 lb 8 oz (61.916 kg)  BMI 26.66 kg/m2 Gen: NAD, pleasant, cooperative HEENT: poor dentition. Moist mucous membranes. No cervical lymphadenopathy Heart: RRR Lungs: CTAB, NWOB Abd: soft, nontender to palpation GU: external genitalia normal appearing, vaginal mucosa and cervix normal. No tenderness on bimanual exam. No palpable adnexal masses. Neuro: strength 5/5 in bilateral legs & arms, L pupil reactive to light. 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 Speech intact.

## 2013-04-01 NOTE — Patient Instructions (Addendum)
It was great to see you again today!  We will call you or send you a letter with your pap smear results.   Since you aren't having chest pain currently, we'll hold off on referring you to cardiology for now. If you have any new chest pain, shortness of breath, or other concerns, please return to be seen.  I've given you a prescription for an albuterol inhaler for you to use just if you need it. If you are using it more than 1-2 times per week, come in to be seen because it's not normal to need it that often.  I also gave you a prescription for a new cholesterol medicine, pravastatin. You'll take it once per day. Stop taking the simvastatin.  Complete the stool cards (follow the instructions given to you by the nurse) and bring them back in. I'll call you if anything is abnormal with them.  Keep up the good work with trying to abstain from cocaine. I'm proud of the work you are doing!  Be well, Dr. Pollie Meyer

## 2013-04-03 ENCOUNTER — Encounter: Payer: Self-pay | Admitting: Family Medicine

## 2013-04-03 DIAGNOSIS — J45909 Unspecified asthma, uncomplicated: Secondary | ICD-10-CM | POA: Insufficient documentation

## 2013-04-03 NOTE — Assessment & Plan Note (Signed)
Encouraged patient's efforts at abstaining from crack cocaine. She seems motivated to stop but does occasionally slip up (about once per month).

## 2013-04-03 NOTE — Assessment & Plan Note (Signed)
Endorses hx of asthma. Occasionally wheezes and gets SOB with exertion. Will rx albuterol inhaler for prn use - counseled patient to return to clinic if she finds she is using it more often than 1-2 x /week.

## 2013-04-03 NOTE — Assessment & Plan Note (Addendum)
Denies any further chest pain. Is on simvastatin for risk factor modification. In light of new lipid guidelines, will switch to pravastatin 40mg . Will need direct LDL at next office visit.  Had planned to refer to cardiology once pt got orange card, but since she has not had any further chest pain will hold off on referral. Precepted with Dr. Lum Babe.

## 2013-04-03 NOTE — Assessment & Plan Note (Signed)
Encouraged smoking cessation 

## 2013-04-03 NOTE — Assessment & Plan Note (Signed)
Pap smear done today Tetanus shot done today Mammogram up to date Given stool cards for colon cancer screening Refuses flu shot

## 2013-04-03 NOTE — Assessment & Plan Note (Addendum)
Well controlled today. Continue current management. F/u 3 mos.

## 2013-04-05 ENCOUNTER — Encounter: Payer: Self-pay | Admitting: Family Medicine

## 2013-04-18 ENCOUNTER — Telehealth: Payer: Self-pay | Admitting: Family Medicine

## 2013-04-18 DIAGNOSIS — R079 Chest pain, unspecified: Secondary | ICD-10-CM

## 2013-04-18 NOTE — Telephone Encounter (Signed)
Called patient at home to discuss cardiology referral (due to history of chest pain and cocaine abuse) with her and see how she is doing. She still reports some shortness of breath but has not yet filled the albuterol inhaler because she has not had the money. She plans to get it filled this week. When asked about chest pain, she says sometimes her chest gets mildly sore. She would like to go to cardiology and be evaluated, which I think is reasonable. Will order cardiology referral. Reminded patient if she has chest pain or shortness of breath that trouble her to go to the ER.   I have precepted this referral with Dr. Jennette Kettle who agrees with this plan.

## 2013-04-19 ENCOUNTER — Ambulatory Visit (INDEPENDENT_AMBULATORY_CARE_PROVIDER_SITE_OTHER): Payer: No Typology Code available for payment source | Admitting: Cardiovascular Disease

## 2013-04-19 ENCOUNTER — Encounter: Payer: Self-pay | Admitting: Cardiovascular Disease

## 2013-04-19 VITALS — BP 105/80 | HR 80 | Ht 60.0 in | Wt 136.0 lb

## 2013-04-19 DIAGNOSIS — F1910 Other psychoactive substance abuse, uncomplicated: Secondary | ICD-10-CM

## 2013-04-19 DIAGNOSIS — F17200 Nicotine dependence, unspecified, uncomplicated: Secondary | ICD-10-CM

## 2013-04-19 DIAGNOSIS — Z720 Tobacco use: Secondary | ICD-10-CM

## 2013-04-19 DIAGNOSIS — R079 Chest pain, unspecified: Secondary | ICD-10-CM

## 2013-04-19 NOTE — Patient Instructions (Addendum)
Your physician recommends that you schedule a follow-up appointment in:  1 month  Your physician has requested that you have an echocardiogram. Echocardiography is a painless test that uses sound waves to create images of your heart. It provides your doctor with information about the size and shape of your heart and how well your heart's chambers and valves are working. This procedure takes approximately one hour. There are no restrictions for this procedure.  Your physician has requested that you have an exercise stress myoview. For further information please visit [email or web address] Please follow instruction sheet, as given.

## 2013-04-19 NOTE — Progress Notes (Signed)
History of Present Illness: 62 yo female with history of tobacco abuse, crack cocaine use, HTN, HLD, chronic kidney disease (horseshoe kidney) who is here today as a new patient for evaluation of dyspnea and chest pain. She tells me that she has had no prior cardiac disease. She has smoked for 40 years but now just 1 cigarette per day. She was in the hospital December 2013 with GI bleeding and found to have diverticulitis and non-bleeding Mallory Weiss tear. She describes dyspnea with exertion. She also notes pain in the left side of her chest with rest and with exertion. She has no energy. No palpitations, near syncope or syncope.   Primary Care Physician: Levert Feinstein (Family Medicine)  Last Lipid Profile:Lipid Panel     Component Value Date/Time   CHOL 183 01/19/2013 0303   TRIG 151* 01/19/2013 0303   HDL 54 01/19/2013 0303   CHOLHDL 3.4 01/19/2013 0303   VLDL 30 01/19/2013 0303   LDLCALC 99 01/19/2013 0303     Past Medical History  Diagnosis Date  . Hypertension   . Hyperlipemia   . Anginal pain   . Shortness of breath   . Chronic kidney disease     rt kidney horseshoe  . Arthritis   . Retinal detachment     R eye, per patient  . Mallory-Weiss tear   . Diverticulitis   . Rectal bleed   . Drug abuse     Past Surgical History  Procedure Laterality Date  . Diagnostic laparoscopy      for horseshoe kidney  . Appendectomy    . Lipoma removal      in 1990s had an egg-sized tumor removed from her leg, she thinks it was a lipoma  . Repair of detached retina    . Esophagogastroduodenoscopy  12/17/2012    Procedure: ESOPHAGOGASTRODUODENOSCOPY (EGD);  Surgeon: Graylin Shiver, MD;  Location: [facility] ENDOSCOPY;  Service: Endoscopy;  Laterality: N/A;    Current Outpatient Prescriptions  Medication Sig Dispense Refill  . amLODipine (NORVASC) 10 MG tablet Take 1 tablet (10 mg total) by mouth daily.  30 tablet  2  . aspirin EC 81 MG EC tablet Take 1 tablet (81 mg total) by mouth  daily.  30 tablet  11  . cholecalciferol (VITAMIN D) 1000 UNITS tablet Take 1,000 Units by mouth daily.      Marland Kitchen lisinopril (PRINIVIL,ZESTRIL) 20 MG tablet Take 1 tablet (20 mg total) by mouth daily.  30 tablet  2  . omega-3 acid ethyl esters (LOVAZA) 1 G capsule Take 1 g by mouth 2 (two) times daily.      . polyethylene glycol powder (GLYCOLAX/MIRALAX) powder Take 17 g by mouth daily.      . pravastatin (PRAVACHOL) 40 MG tablet Take 1 tablet (40 mg total) by mouth daily.  30 tablet  3  . albuterol (PROVENTIL HFA;VENTOLIN HFA) 108 (90 BASE) MCG/ACT inhaler Inhale 2 puffs into the lungs every 6 (six) hours as needed for wheezing or shortness of breath.  1 Inhaler  0   No current facility-administered medications for this visit.    No Known Allergies  History   Social History  . Marital Status: Legally Separated    Spouse Name: N/A    Number of Children: 1  . Years of Education: N/A   Occupational History  . Housekeeping    Social History Main Topics  . Smoking status: Current Some Day Smoker -- 0.10 packs/day for 40 years    Types:  Cigarettes  . Smokeless tobacco: Never Used     Comment: trying to quitt  . Alcohol Use: No     Comment: occ  . Drug Use: 3.00 per week    Special: Marijuana, Cocaine     Comment: cocaine and THC + in [facility] in November 2013  . Sexually Active: No   Other Topics Concern  . Not on file   Social History Narrative   Works in [employer]; Lives with daughter (40 year old) in [place]       As of 04/01/13:   -Walks for exercise   -Eats healthy - greens, potato salad, baked chicken   -Works in [employer]   -Currently smokes 1 or 2 puffs of cigarettes "here or there" - about once per week   -Has history of smoking crack cocaine and is trying hard to stop. Has been going to cocaine support group. It's been hard because she takes the bus. When she feels the urge to smoke she prays, reads the Bible, or calls her sister for support. She does  well if she stays active, but the times when she most wants to smoke crack is if she is bored at home.   -Also uses marijuana every few months   -Has about two drinks monthly or less   -Denies any mood problems/depression symptoms    Family History  Problem Relation Age of Onset  . Coronary artery disease Maternal Grandmother   . Coronary artery disease Maternal Uncle     Review of Systems:  As stated in the HPI and otherwise negative.   BP 105/80  Pulse 80  Ht 5' (1.524 m)  Wt 136 lb (61.689 kg)  BMI 26.56 kg/m2  Physical Examination: General: Well developed, well nourished, NAD HEENT: OP clear, mucus membranes moist SKIN: warm, dry. No rashes. Neuro: No focal deficits Musculoskeletal: Muscle strength 5/5 all ext Psychiatric: Mood and affect normal Neck: No JVD, no carotid bruits, no thyromegaly, no lymphadenopathy. Lungs:Clear bilaterally, no wheezes, rhonci, crackles Cardiovascular: Regular rate and rhythm. No murmurs, gallops or rubs. Abdomen:Soft. Bowel sounds present. Non-tender.  Extremities: No lower extremity edema. Pulses are 2 + in the bilateral DP/PT.  EKG: January 2014: Sinus, poor R wave progression precordial leads.   Assessment and Plan:   1. Chest pain: She has resting and exertional chest pains with typical and atypical features. She has a long standing history of cocaine abuse and tobacco abuse. Will arrange echo to exclude structural heart disease and exercise stress myoview to exclude ischemia.   2. Cocaine abuse: Cessation recommended.   3. Tobacco abuse: Cessatoin recommended.

## 2013-04-25 ENCOUNTER — Ambulatory Visit (HOSPITAL_COMMUNITY): Payer: No Typology Code available for payment source | Attending: Cardiology | Admitting: Radiology

## 2013-04-25 DIAGNOSIS — R079 Chest pain, unspecified: Secondary | ICD-10-CM | POA: Insufficient documentation

## 2013-04-25 DIAGNOSIS — R0989 Other specified symptoms and signs involving the circulatory and respiratory systems: Secondary | ICD-10-CM | POA: Insufficient documentation

## 2013-04-25 DIAGNOSIS — R0609 Other forms of dyspnea: Secondary | ICD-10-CM | POA: Insufficient documentation

## 2013-04-25 DIAGNOSIS — N189 Chronic kidney disease, unspecified: Secondary | ICD-10-CM | POA: Insufficient documentation

## 2013-04-25 DIAGNOSIS — E785 Hyperlipidemia, unspecified: Secondary | ICD-10-CM | POA: Insufficient documentation

## 2013-04-25 DIAGNOSIS — R072 Precordial pain: Secondary | ICD-10-CM

## 2013-04-25 DIAGNOSIS — I10 Essential (primary) hypertension: Secondary | ICD-10-CM | POA: Insufficient documentation

## 2013-04-25 DIAGNOSIS — F1910 Other psychoactive substance abuse, uncomplicated: Secondary | ICD-10-CM | POA: Insufficient documentation

## 2013-04-25 DIAGNOSIS — F17200 Nicotine dependence, unspecified, uncomplicated: Secondary | ICD-10-CM | POA: Insufficient documentation

## 2013-04-25 NOTE — Progress Notes (Signed)
Echocardiogram performed.  

## 2013-04-26 LAB — POC HEMOCCULT BLD/STL (HOME/3-CARD/SCREEN): Fecal Occult Blood, POC: NEGATIVE

## 2013-04-26 NOTE — Addendum Note (Signed)
Addended by: [place], Keylee Shrestha on: 04/26/2013 02:07 PM   Modules accepted: Orders

## 2013-04-27 ENCOUNTER — Ambulatory Visit (HOSPITAL_COMMUNITY): Payer: No Typology Code available for payment source | Attending: Cardiovascular Disease | Admitting: Radiology

## 2013-04-27 VITALS — BP 113/91 | Ht 60.0 in | Wt 136.0 lb

## 2013-04-27 DIAGNOSIS — R0989 Other specified symptoms and signs involving the circulatory and respiratory systems: Secondary | ICD-10-CM | POA: Insufficient documentation

## 2013-04-27 DIAGNOSIS — R0602 Shortness of breath: Secondary | ICD-10-CM

## 2013-04-27 DIAGNOSIS — R0609 Other forms of dyspnea: Secondary | ICD-10-CM | POA: Insufficient documentation

## 2013-04-27 DIAGNOSIS — R002 Palpitations: Secondary | ICD-10-CM | POA: Insufficient documentation

## 2013-04-27 DIAGNOSIS — E785 Hyperlipidemia, unspecified: Secondary | ICD-10-CM | POA: Insufficient documentation

## 2013-04-27 DIAGNOSIS — R079 Chest pain, unspecified: Secondary | ICD-10-CM

## 2013-04-27 DIAGNOSIS — F17200 Nicotine dependence, unspecified, uncomplicated: Secondary | ICD-10-CM | POA: Insufficient documentation

## 2013-04-27 DIAGNOSIS — R5381 Other malaise: Secondary | ICD-10-CM | POA: Insufficient documentation

## 2013-04-27 DIAGNOSIS — I10 Essential (primary) hypertension: Secondary | ICD-10-CM | POA: Insufficient documentation

## 2013-04-27 DIAGNOSIS — Z8249 Family history of ischemic heart disease and other diseases of the circulatory system: Secondary | ICD-10-CM | POA: Insufficient documentation

## 2013-04-27 MED ORDER — TECHNETIUM TC 99M SESTAMIBI GENERIC - CARDIOLITE
10.0000 | Freq: Once | INTRAVENOUS | Status: AC | PRN
  Administered 2013-04-27: 10 via INTRAVENOUS

## 2013-04-27 MED ORDER — TECHNETIUM TC 99M SESTAMIBI GENERIC - CARDIOLITE
30.0000 | Freq: Once | INTRAVENOUS | Status: AC | PRN
  Administered 2013-04-27: 30 via INTRAVENOUS

## 2013-04-27 NOTE — Progress Notes (Signed)
[facility] SITE 3 NUCLEAR MED [address] [place], [place] [address] [phone]    Cardiology Nuclear Med Study  Jennifer Moss is a 62 y.o. female     MRN : [id number]     DOB: 1951/02/05  Procedure Date: 04/27/2013  Nuclear Med Background Indication for Stress Test:  Evaluation for Ischemia History:  CKD, H/O (+) substance abuse cocaine, marjuana Cardiac Risk Factors: Family History - CAD, Hypertension, Lipids and Smoker  Symptoms:  Chest Pain, Chest Pain, DOE, Fatigue, Palpitations and SOB   Nuclear Pre-Procedure Caffeine/Decaff Intake:  None NPO After: 11:30pm   Lungs:  clear O2 Sat: 99% on room air. IV 0.9% NS with Angio Cath:  22g  IV Site: R Antecubital  IV Started by:  Bonnita Levan, RN  Chest Size (in):  34 Cup Size: C  Height: 5' (1.524 m)  Weight:  136 lb (61.689 kg)  BMI:  Body mass index is 26.56 kg/(m^2). Tech Comments:  No Rx this am    Nuclear Med Study 1 or 2 day study: 1 day  Stress Test Type:  Stress  Reading MD: Kristeen Miss, MD  Order Authorizing Provider:  C.McAlhany MD  Resting Radionuclide: Technetium 11m Sestamibi  Resting Radionuclide Dose: 11.0 mCi   Stress Radionuclide:  Technetium 84m Sestamibi  Stress Radionuclide Dose: 33.0 mCi           Stress Protocol Rest HR: 63 Stress HR: 144  Rest BP: 113/91 Stress BP: 152/104  Exercise Time (min): 6:45 METS: 8.10   Predicted Max HR: 161 bpm % Max HR: 89.44 bpm Rate Pressure Product: 96295   Dose of Adenosine (mg):  n/a Dose of Lexiscan: n/a mg  Dose of Atropine (mg): n/a Dose of Dobutamine: n/a mcg/kg/min (at max HR)  Stress Test Technologist: Milana Na, EMT-P  Nuclear Technologist:  Domenic Polite, CNMT     Rest Procedure:  Myocardial perfusion imaging was performed at rest 45 minutes following the intravenous administration of Technetium 75m Sestamibi. Rest ECG: NSR - Normal EKG  Stress Procedure:  The patient exercised on the treadmill utilizing the Bruce Protocol  for 6:45 minutes. The patient stopped due to fatigue, sob, and chest discomfort.  Technetium 8m Sestamibi was injected at peak exercise and myocardial perfusion imaging was performed after a brief delay. Stress ECG: No significant change from baseline ECG  QPS Raw Data Images:  There is interference from nuclear activity from structures below the diaphragm. This does not affect the ability to read the study. Stress Images:  Normal homogeneous uptake in all areas of the myocardium. Rest Images:  There is increased activity from uptake below the diaphragm that obscures the inferior apical wall but otherwise the images look normal.  Subtraction (SDS):  No evidence of ischemia. Transient Ischemic Dilatation (Normal <1.22):  1.08 Lung/Heart Ratio (Normal <0.45):  0.35  Quantitative Gated Spect Images QGS EDV:  81 ml QGS ESV:  38 ml  Impression Exercise Capacity:  Fair exercise capacity. BP Response:  Normal blood pressure response. Clinical Symptoms:  No significant symptoms noted. ECG Impression:  No significant ST segment change suggestive of ischemia. Comparison with Prior Nuclear Study: No images to compare  Overall Impression:  Normal stress nuclear study.  No evidence of ischemia.  LV Ejection Fraction: 53%.  LV Wall Motion:  NL LV Function; NL Wall Motion.    Vesta Mixer, Montez Hageman., MD, [facility] 04/27/2013, 4:39 PM Office - [phone] Pager [phone]

## 2013-05-27 ENCOUNTER — Ambulatory Visit: Payer: No Typology Code available for payment source | Admitting: Cardiovascular Disease

## 2013-08-04 ENCOUNTER — Ambulatory Visit: Payer: No Typology Code available for payment source | Admitting: Cardiovascular Disease

## 2013-08-18 ENCOUNTER — Ambulatory Visit: Payer: No Typology Code available for payment source

## 2013-09-14 ENCOUNTER — Encounter: Payer: Self-pay | Admitting: Cardiovascular Disease

## 2013-09-14 ENCOUNTER — Ambulatory Visit (INDEPENDENT_AMBULATORY_CARE_PROVIDER_SITE_OTHER): Payer: No Typology Code available for payment source | Admitting: Cardiovascular Disease

## 2013-09-14 VITALS — BP 152/89 | HR 84 | Ht 60.0 in | Wt 139.0 lb

## 2013-09-14 DIAGNOSIS — R079 Chest pain, unspecified: Secondary | ICD-10-CM

## 2013-09-14 NOTE — Progress Notes (Signed)
History of Present Illness: 62 yo female with history of tobacco abuse, crack cocaine use, HTN, HLD, chronic kidney disease (horseshoe kidney) who is here today for cardiac follow up. I saw her April 2014 as a new patient for evaluation of dyspnea and chest pain.  She was in the hospital December 2013 with GI bleeding and found to have diverticulitis and non-bleeding Mallory Weiss tear. She described dyspnea with exertion. She also noted pain in the left side of her chest with rest and with exertion. She has no energy. No palpitations, near syncope or syncope. I arranged an echo which was normal and a stress myoview which was normal.   She is here today for follow up. She has stopped smoking and stopped using cocaine. She is feeling better. She has mild SOB. No chest pain. No dizziness or near syncope.   Primary Care Physician: Levert Feinstein (Family Medicine)  Last Lipid Profile:Lipid Panel     Component Value Date/Time   CHOL 183 01/19/2013 0303   TRIG 151* 01/19/2013 0303   HDL 54 01/19/2013 0303   CHOLHDL 3.4 01/19/2013 0303   VLDL 30 01/19/2013 0303   LDLCALC 99 01/19/2013 0303     Past Medical History  Diagnosis Date  . Hypertension   . Hyperlipemia   . Anginal pain   . Shortness of breath   . Chronic kidney disease     rt kidney horseshoe  . Arthritis   . Retinal detachment     R eye, per patient  . Mallory-Weiss tear   . Diverticulitis   . Rectal bleed   . Drug abuse     Past Surgical History  Procedure Laterality Date  . Diagnostic laparoscopy      for horseshoe kidney  . Appendectomy    . Lipoma removal      in 1990s had an egg-sized tumor removed from her leg, she thinks it was a lipoma  . Repair of detached retina    . Esophagogastroduodenoscopy  12/17/2012    Procedure: ESOPHAGOGASTRODUODENOSCOPY (EGD);  Surgeon: Graylin Shiver, MD;  Location: [facility] ENDOSCOPY;  Service: Endoscopy;  Laterality: N/A;    Current Outpatient Prescriptions  Medication Sig Dispense  Refill  . albuterol (PROVENTIL HFA;VENTOLIN HFA) 108 (90 BASE) MCG/ACT inhaler Inhale 2 puffs into the lungs every 6 (six) hours as needed for wheezing or shortness of breath.  1 Inhaler  0  . amLODipine (NORVASC) 10 MG tablet Take 1 tablet (10 mg total) by mouth daily.  30 tablet  2  . aspirin EC 81 MG EC tablet Take 1 tablet (81 mg total) by mouth daily.  30 tablet  11  . cholecalciferol (VITAMIN D) 1000 UNITS tablet Take 1,000 Units by mouth daily.      Marland Kitchen lisinopril (PRINIVIL,ZESTRIL) 20 MG tablet Take 1 tablet (20 mg total) by mouth daily.  30 tablet  2  . omega-3 acid ethyl esters (LOVAZA) 1 G capsule Take 1 g by mouth 2 (two) times daily.      . polyethylene glycol powder (GLYCOLAX/MIRALAX) powder Take 17 g by mouth daily.      . pravastatin (PRAVACHOL) 40 MG tablet Take 1 tablet (40 mg total) by mouth daily.  30 tablet  3   No current facility-administered medications for this visit.    No Known Allergies  History   Social History  . Marital Status: Legally Separated    Spouse Name: N/A    Number of Children: 1  . Years of Education:  N/A   Occupational History  . Housekeeping    Social History Main Topics  . Smoking status: Current Some Day Smoker -- 0.10 packs/day for 40 years    Types: Cigarettes  . Smokeless tobacco: Never Used     Comment: trying to quitt  . Alcohol Use: No     Comment: occ  . Drug Use: 3.00 per week    Special: Marijuana, Cocaine     Comment: cocaine and THC + in UDS in November 2013  . Sexual Activity: No   Other Topics Concern  . Not on file   Social History Narrative   Works in [employer]; Lives with daughter (79 year old) in [place]       As of 04/01/13:   -Walks for exercise   -Eats healthy - greens, potato salad, baked chicken   -Works in [employer]   -Currently smokes 1 or 2 puffs of cigarettes "here or there" - about once per week   -Has history of smoking crack cocaine and is trying hard to stop. Has been going to  cocaine support group. It's been hard because she takes the bus. When she feels the urge to smoke she prays, reads the Bible, or calls her sister for support. She does well if she stays active, but the times when she most wants to smoke crack is if she is bored at home.   -Also uses marijuana every few months   -Has about two drinks monthly or less   -Denies any mood problems/depression symptoms    Family History  Problem Relation Age of Onset  . Coronary artery disease Maternal Grandmother   . Coronary artery disease Maternal Uncle     Review of Systems:  As stated in the HPI and otherwise negative.   BP 152/89  Pulse 84  Ht 5' (1.524 m)  Wt 139 lb (63.05 kg)  BMI 27.15 kg/m2  Physical Examination: General: Well developed, well nourished, NAD HEENT: OP clear, mucus membranes moist SKIN: warm, dry. No rashes. Neuro: No focal deficits Musculoskeletal: Muscle strength 5/5 all ext Psychiatric: Mood and affect normal Neck: No JVD, no carotid bruits, no thyromegaly, no lymphadenopathy. Lungs:Clear bilaterally, no wheezes, rhonci, crackles Cardiovascular: Regular rate and rhythm. No murmurs, gallops or rubs. Abdomen:Soft. Bowel sounds present. Non-tender.  Extremities: No lower extremity edema. Pulses are 2 + in the bilateral DP/PT.  EKG: January 2014: Sinus, poor R wave progression precordial leads.   Echo 04/25/13: Left ventricle: The cavity size was normal. Wall thickness was normal. Systolic function was normal. The estimated ejection fraction was in the range of 55% to 60%. Wall motion was normal; there were no regional wall motion abnormalities. Doppler parameters are consistent with abnormal left ventricular relaxation (grade 1 diastolic dysfunction). - Pericardium, extracardiac: A trivial pericardial effusion was identified.  Assessment and Plan:   1. Chest pain: Resolved. No recurrence. Did not appear to be cardiac related. Stress test without ischemia. LV function  normal.   2. Cocaine abuse: She has stopped.   3. Tobacco abuse: She has stopped.

## 2013-09-14 NOTE — Patient Instructions (Addendum)
Your physician recommends that you schedule a follow-up appointment  As needed with Dr. McAlhany  

## 2013-09-23 ENCOUNTER — Ambulatory Visit (INDEPENDENT_AMBULATORY_CARE_PROVIDER_SITE_OTHER): Payer: No Typology Code available for payment source | Admitting: *Deleted

## 2013-09-23 DIAGNOSIS — Z111 Encounter for screening for respiratory tuberculosis: Secondary | ICD-10-CM

## 2013-09-23 NOTE — Progress Notes (Signed)
Tuberculin skin test applied to left ventral forearm.   Return appt scheduled with nurse to have site read in 48-72 hours.  Gaylene Brooks, RN

## 2013-09-26 ENCOUNTER — Encounter: Payer: Self-pay | Admitting: *Deleted

## 2013-09-26 ENCOUNTER — Ambulatory Visit (INDEPENDENT_AMBULATORY_CARE_PROVIDER_SITE_OTHER): Payer: No Typology Code available for payment source | Admitting: *Deleted

## 2013-09-26 DIAGNOSIS — Z111 Encounter for screening for respiratory tuberculosis: Secondary | ICD-10-CM

## 2013-09-26 LAB — TB SKIN TEST: TB Skin Test: NEGATIVE

## 2013-10-19 ENCOUNTER — Other Ambulatory Visit: Payer: Self-pay | Admitting: Family Medicine

## 2013-10-19 DIAGNOSIS — Z1231 Encounter for screening mammogram for malignant neoplasm of breast: Secondary | ICD-10-CM

## 2013-10-26 ENCOUNTER — Telehealth: Payer: Self-pay | Admitting: Family Medicine

## 2013-10-26 MED ORDER — AMLODIPINE BESYLATE 10 MG PO TABS: 10.0000 mg | ORAL_TABLET | Freq: Every day | ORAL | Status: DC

## 2013-10-26 NOTE — Telephone Encounter (Signed)
To [facility] red team - please call pt and let her know I have refilled her amlodipine but she needs to schedule an appointment to follow up on her medical problems. Thanks!

## 2013-10-27 NOTE — Telephone Encounter (Signed)
Called pt. Informed. Pt agreed. .Jennifer Moss  

## 2013-11-04 ENCOUNTER — Ambulatory Visit (HOSPITAL_COMMUNITY)
Admission: RE | Admit: 2013-11-04 | Discharge: 2013-11-04 | Disposition: A | Discharge status: Home / Self Care | Payer: No Typology Code available for payment source | Source: Ambulatory Visit | Attending: Internal Medicine | Admitting: Internal Medicine

## 2013-11-04 DIAGNOSIS — Z1231 Encounter for screening mammogram for malignant neoplasm of breast: Secondary | ICD-10-CM | POA: Insufficient documentation

## 2013-11-28 ENCOUNTER — Encounter: Payer: Self-pay | Admitting: Family Medicine

## 2013-11-28 ENCOUNTER — Ambulatory Visit (INDEPENDENT_AMBULATORY_CARE_PROVIDER_SITE_OTHER): Payer: No Typology Code available for payment source | Admitting: Family Medicine

## 2013-11-28 VITALS — BP 127/85 | HR 69 | Temp 98.2°F | Ht 60.0 in | Wt 140.0 lb

## 2013-11-28 DIAGNOSIS — I10 Essential (primary) hypertension: Secondary | ICD-10-CM

## 2013-11-28 DIAGNOSIS — R209 Unspecified disturbances of skin sensation: Secondary | ICD-10-CM

## 2013-11-28 DIAGNOSIS — F1910 Other psychoactive substance abuse, uncomplicated: Secondary | ICD-10-CM

## 2013-11-28 DIAGNOSIS — E785 Hyperlipidemia, unspecified: Secondary | ICD-10-CM | POA: Insufficient documentation

## 2013-11-28 DIAGNOSIS — R200 Anesthesia of skin: Secondary | ICD-10-CM

## 2013-11-28 LAB — LIPID PANEL
Cholesterol: 171 mg/dL (ref 0–200)
Triglycerides: 95 mg/dL (ref <150)
VLDL: 19 mg/dL (ref 0–40)

## 2013-11-28 LAB — CBC
Hemoglobin: 15.1 g/dL — ABNORMAL HIGH (ref 12.0–15.0)
MCHC: 33.3 g/dL (ref 30.0–36.0)
Platelets: 348 10*3/uL (ref 150–400)
RDW: 15.1 % (ref 11.5–15.5)

## 2013-11-28 LAB — COMPREHENSIVE METABOLIC PANEL
AST: 19 U/L (ref 0–37)
Albumin: 4.1 g/dL (ref 3.5–5.2)
BUN: 20 mg/dL (ref 6–23)
Calcium: 9.2 mg/dL (ref 8.4–10.5)
Chloride: 106 mEq/L (ref 96–112)
Potassium: 4.8 mEq/L (ref 3.5–5.3)

## 2013-11-28 MED ORDER — AMLODIPINE BESYLATE 10 MG PO TABS: 10.0000 mg | ORAL_TABLET | Freq: Every day | ORAL | Status: DC

## 2013-11-28 MED ORDER — PRAVASTATIN SODIUM 40 MG PO TABS: 40.0000 mg | ORAL_TABLET | Freq: Every day | ORAL | Status: DC

## 2013-11-28 MED ORDER — LISINOPRIL 20 MG PO TABS: 20.0000 mg | ORAL_TABLET | Freq: Every day | ORAL | Status: DC

## 2013-11-28 NOTE — Assessment & Plan Note (Addendum)
Continued to encourage cessation of all cocaine. Congratulated her on going almost 7 months without using (despite using recently on her birthday).

## 2013-11-28 NOTE — Progress Notes (Signed)
Patient ID: OLIVETTE BECKMANN, female   DOB: 10-Mar-1951, 62 y.o.   MRN: [id number]  HPI:  HTN: Needs medications refilled. Currently taking lisinopril 20mg  daily, amlodipine 10mg  daily. Denies CP or SOB. Occasionally still has numbness in chest but states she was seen at cardiology recently and was told this was not her heart.  Cocaine use: stopped using cocaine in May, although she did use just a few days ago on her 62th birthday as a treat to herself.  Face numbness: this has occurred off and on for about one year. It has become worse in the last 3 months. It is a tingling sensation that comes over her face starting at her chin radiating upwards to the top of her head. Initially the feeling is of numbness, and when the feeling returns it is a tingling sensation. She has had 3 episodes here in the clinic today. Each episode lasts about 1 seconds. It has been happening more often. She denies any headache, vision changes, weakness, speech problems, or fevers. These sensations weaker feeling kind of "woozy". The sensation is present on both sides of her face.  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 NCAT. MMM, mildly poor dentition, no palpable anterior cervical LAD Heart: RRR, no murmurs Lungs: CTAB, NWOB Neuro: speech normal, alert and oriented x3, grip 5/5 bilat, hip & knee flexion 5/5 bilat, face symmetric, tongue protrudes midline, sensation in tact to light touch over bilateral face  ASSESSMENT/PLAN:  # Health maintenance:  -declines flu shot today  # Facial numbness/tingling: unclear etiology. Neuro exam nonfocal, so I doubt this is from any severe etiology. No abnormalities on exam. - will start basic workup with TSH, CMET, CBC  today since we need to draw bloodwork anyway today - if persists or worsens, consider workup for lyme, HIV, malignancy, or reviewing medications for possible causes, although doubt any of these are contributing - cautioned pt regarding red flags and return precautions (see AVS) - Precepted with Dr. McDiarmid who agrees with this plan.   See problem based charting for additional assessment/plan.  FOLLOW UP: F/u in 4 months for hypertension, f/u sooner if facial numbness/tingling worsens or persists.  [place] J. Pollie Meyer, MD [facility]

## 2013-11-28 NOTE — Assessment & Plan Note (Addendum)
Currently on pravastatin. Will check fasting lipid panel and CMET today and titrate statin as needed.

## 2013-11-28 NOTE — Assessment & Plan Note (Signed)
BP well-controlled, will continue current regimen. Refills given today. Followup in 4 months. Will check CMET today.

## 2013-11-28 NOTE — Patient Instructions (Addendum)
It was great to see you again today!  I'm not sure what is causing your facial numbness. I am checking some bloodwork today. I will call you if your test results are not normal.  Otherwise, I will send you a letter.  If you do not hear from me with in 2 weeks please call our office.     If you have symptoms on one side of your face, numbness/weakness on one side of your body, drooping of your face, weight loss, fevers, speech problems, or any other symptoms please return to see me or go to the Emergency Room.  Please return for a visit in 4 months to follow up on your blood pressure.  Happy Holidays! Dr. Pollie Meyer

## 2013-12-01 ENCOUNTER — Encounter: Payer: Self-pay | Admitting: Family Medicine

## 2013-12-01 ENCOUNTER — Telehealth: Payer: Self-pay | Admitting: Family Medicine

## 2013-12-01 NOTE — Telephone Encounter (Signed)
[facility] red team, Please call pt to inform her of the following lab results:  - her kidney function is stable, but since she has some chronic kidney disease, we need to do a few additional tests. I would like her to come in for additional bloodwork and a urine sample. She does not need an appointment to see me for this, but can just schedule a lab appointment. - the rest of the bloodwork we did looked good, and she should continue with her current medicines.  I will be going out of town for tomorrow and over the weekend, but if she has specific questions for me I am happy to get in touch with her next week when I return.  Thanks! Latrelle Dodrill, MD

## 2013-12-01 NOTE — Telephone Encounter (Signed)
Called pt and informed. Pt agreed. .Jennifer Moss  

## 2013-12-05 ENCOUNTER — Other Ambulatory Visit (INDEPENDENT_AMBULATORY_CARE_PROVIDER_SITE_OTHER): Payer: No Typology Code available for payment source

## 2013-12-05 LAB — POCT URINALYSIS DIPSTICK
Blood, UA: NEGATIVE
Glucose, UA: NEGATIVE
Ketones, UA: NEGATIVE
Leukocytes, UA: NEGATIVE
Nitrite, UA: NEGATIVE
Protein, UA: 30
Spec Grav, UA: 1.02
Urobilinogen, UA: 0.2

## 2013-12-05 LAB — POCT UA - MICROSCOPIC ONLY

## 2013-12-05 LAB — MAGNESIUM: Magnesium: 2.1 mg/dL (ref 1.5–2.5)

## 2013-12-05 NOTE — Progress Notes (Unsigned)
PTH,MAG,PHOS ,VIT D AND UA DONE TODAY Jennifer Moss

## 2013-12-06 LAB — PARATHYROID HORMONE, INTACT (NO CA): PTH: 85.6 pg/mL — ABNORMAL HIGH (ref 14.0–72.0)

## 2013-12-06 LAB — VITAMIN D 25 HYDROXY (VIT D DEFICIENCY, FRACTURES): Vit D, 25-Hydroxy: 47 ng/mL (ref 30–89)

## 2013-12-09 ENCOUNTER — Encounter: Payer: Self-pay | Admitting: Family Medicine

## 2013-12-20 ENCOUNTER — Ambulatory Visit: Payer: No Typology Code available for payment source

## 2014-04-23 ENCOUNTER — Emergency Department (HOSPITAL_COMMUNITY)
Admission: EM | Admit: 2014-04-23 | Discharge: 2014-04-23 | Disposition: A | Discharge status: Home / Self Care | Payer: No Typology Code available for payment source | Attending: Emergency Medicine | Admitting: Emergency Medicine

## 2014-04-23 ENCOUNTER — Encounter (HOSPITAL_COMMUNITY): Payer: Self-pay | Admitting: Emergency Medicine

## 2014-04-23 ENCOUNTER — Emergency Department (HOSPITAL_COMMUNITY): Payer: No Typology Code available for payment source

## 2014-04-23 DIAGNOSIS — K921 Melena: Secondary | ICD-10-CM | POA: Insufficient documentation

## 2014-04-23 DIAGNOSIS — I129 Hypertensive chronic kidney disease with stage 1 through stage 4 chronic kidney disease, or unspecified chronic kidney disease: Secondary | ICD-10-CM | POA: Insufficient documentation

## 2014-04-23 DIAGNOSIS — K5900 Constipation, unspecified: Secondary | ICD-10-CM | POA: Insufficient documentation

## 2014-04-23 DIAGNOSIS — Z79899 Other long term (current) drug therapy: Secondary | ICD-10-CM | POA: Insufficient documentation

## 2014-04-23 DIAGNOSIS — N189 Chronic kidney disease, unspecified: Secondary | ICD-10-CM | POA: Insufficient documentation

## 2014-04-23 DIAGNOSIS — I209 Angina pectoris, unspecified: Secondary | ICD-10-CM | POA: Insufficient documentation

## 2014-04-23 DIAGNOSIS — F17200 Nicotine dependence, unspecified, uncomplicated: Secondary | ICD-10-CM | POA: Insufficient documentation

## 2014-04-23 DIAGNOSIS — Z7982 Long term (current) use of aspirin: Secondary | ICD-10-CM | POA: Insufficient documentation

## 2014-04-23 DIAGNOSIS — K5289 Other specified noninfective gastroenteritis and colitis: Secondary | ICD-10-CM | POA: Insufficient documentation

## 2014-04-23 DIAGNOSIS — Z9889 Other specified postprocedural states: Secondary | ICD-10-CM | POA: Insufficient documentation

## 2014-04-23 DIAGNOSIS — K529 Noninfective gastroenteritis and colitis, unspecified: Secondary | ICD-10-CM

## 2014-04-23 LAB — CBC WITH DIFFERENTIAL/PLATELET
Basophils Absolute: 0 10*3/uL (ref 0.0–0.1)
Basophils Relative: 0 % (ref 0–1)
Eosinophils Absolute: 0.2 10*3/uL (ref 0.0–0.7)
Eosinophils Relative: 2 % (ref 0–5)
HCT: 50 % — ABNORMAL HIGH (ref 36.0–46.0)
Hemoglobin: 17 g/dL — ABNORMAL HIGH (ref 12.0–15.0)
LYMPHS ABS: 2.5 10*3/uL (ref 0.7–4.0)
Lymphocytes Relative: 20 % (ref 12–46)
MCH: 28.3 pg (ref 26.0–34.0)
MCHC: 34 g/dL (ref 30.0–36.0)
MCV: 83.3 fL (ref 78.0–100.0)
Monocytes Absolute: 0.4 10*3/uL (ref 0.1–1.0)
Monocytes Relative: 3 % (ref 3–12)
Neutro Abs: 9.4 10*3/uL — ABNORMAL HIGH (ref 1.7–7.7)
Neutrophils Relative %: 75 % (ref 43–77)
Platelets: 304 10*3/uL (ref 150–400)
RBC: 6 MIL/uL — AB (ref 3.87–5.11)
RDW: 14 % (ref 11.5–15.5)
WBC: 12.6 10*3/uL — ABNORMAL HIGH (ref 4.0–10.5)

## 2014-04-23 LAB — URINALYSIS, ROUTINE W REFLEX MICROSCOPIC
BILIRUBIN URINE: NEGATIVE
Glucose, UA: NEGATIVE mg/dL
HGB URINE DIPSTICK: NEGATIVE
KETONES UR: NEGATIVE mg/dL
Leukocytes, UA: NEGATIVE
Nitrite: NEGATIVE
PH: 5.5 (ref 5.0–8.0)
Protein, ur: 100 mg/dL — AB
Specific Gravity, Urine: 1.018 (ref 1.005–1.030)
Urobilinogen, UA: 0.2 mg/dL (ref 0.0–1.0)

## 2014-04-23 LAB — COMPREHENSIVE METABOLIC PANEL
ALK PHOS: 129 U/L — AB (ref 39–117)
ALT: 15 U/L (ref 0–35)
AST: 19 U/L (ref 0–37)
Albumin: 4.1 g/dL (ref 3.5–5.2)
BUN: 16 mg/dL (ref 6–23)
CO2: 24 mEq/L (ref 19–32)
Calcium: 10.1 mg/dL (ref 8.4–10.5)
Chloride: 101 mEq/L (ref 96–112)
Creatinine, Ser: 1.49 mg/dL — ABNORMAL HIGH (ref 0.50–1.10)
GFR calc non Af Amer: 37 mL/min — ABNORMAL LOW (ref >90)
GFR, EST AFRICAN AMERICAN: 43 mL/min — AB (ref >90)
GLUCOSE: 92 mg/dL (ref 70–99)
POTASSIUM: 4 meq/L (ref 3.7–5.3)
Sodium: 140 mEq/L (ref 137–147)
TOTAL PROTEIN: 8 g/dL (ref 6.0–8.3)
Total Bilirubin: 0.3 mg/dL (ref 0.3–1.2)

## 2014-04-23 LAB — TROPONIN I: Troponin I: <0.3 ng/mL (ref <0.30)

## 2014-04-23 LAB — URINE MICROSCOPIC-ADD ON

## 2014-04-23 LAB — LIPASE, BLOOD: Lipase: 21 U/L (ref 11–59)

## 2014-04-23 LAB — POC OCCULT BLOOD, ED: FECAL OCCULT BLD: POSITIVE — AB

## 2014-04-23 MED ORDER — ONDANSETRON HCL 4 MG/2ML IJ SOLN
4.0000 mg | Freq: Once | INTRAMUSCULAR | Status: AC
  Administered 2014-04-23: 4 mg via INTRAVENOUS
  Filled 2014-04-23: qty 2

## 2014-04-23 MED ORDER — IOHEXOL 300 MG/ML  SOLN
50.0000 mL | Freq: Once | INTRAMUSCULAR | Status: AC | PRN
  Administered 2014-04-23: 50 mL via ORAL

## 2014-04-23 MED ORDER — CIPROFLOXACIN IN D5W 400 MG/200ML IV SOLN
400.0000 mg | Freq: Once | INTRAVENOUS | Status: AC
  Administered 2014-04-23: 400 mg via INTRAVENOUS
  Filled 2014-04-23: qty 200

## 2014-04-23 MED ORDER — METRONIDAZOLE 500 MG PO TABS: 500.0000 mg | ORAL_TABLET | Freq: Three times a day (TID) | ORAL | Status: DC

## 2014-04-23 MED ORDER — SODIUM CHLORIDE 0.9 % IV SOLN
INTRAVENOUS | Status: DC
  Administered 2014-04-23: 14:00:00 via INTRAVENOUS

## 2014-04-23 MED ORDER — MORPHINE SULFATE 4 MG/ML IJ SOLN
4.0000 mg | Freq: Once | INTRAMUSCULAR | Status: AC
  Administered 2014-04-23: 4 mg via INTRAVENOUS
  Filled 2014-04-23: qty 1

## 2014-04-23 MED ORDER — ONDANSETRON HCL 4 MG PO TABS: 4.0000 mg | ORAL_TABLET | Freq: Four times a day (QID) | ORAL | Status: DC

## 2014-04-23 MED ORDER — METRONIDAZOLE IN NACL 5-0.79 MG/ML-% IV SOLN
500.0000 mg | Freq: Once | INTRAVENOUS | Status: AC
  Administered 2014-04-23: 500 mg via INTRAVENOUS
  Filled 2014-04-23: qty 100

## 2014-04-23 MED ORDER — SODIUM CHLORIDE 0.9 % IV BOLUS (SEPSIS)
1000.0000 mL | Freq: Once | INTRAVENOUS | Status: AC
  Administered 2014-04-23: 1000 mL via INTRAVENOUS

## 2014-04-23 MED ORDER — CIPROFLOXACIN HCL 500 MG PO TABS: 500.0000 mg | ORAL_TABLET | Freq: Two times a day (BID) | ORAL | Status: DC

## 2014-04-23 NOTE — Discharge Instructions (Signed)
Please call your doctor for a followup appointment within 24-48 hours. When you talk to your doctor please let them know that you were seen in the emergency department and have them acquire all of your records so that they can discuss the findings with you and formulate a treatment plan to fully care for your new and ongoing problems. Please call and set-up an appointment with your primary care provider to be reassessed within the next 24-48 hours Please call and set up an appointment with gastroenterology to be reassessed regarding ongoing inflammatory processes of the abdomen Please rest and stay hydrated Please take medications as prescribed Please follow the liquid clear diaphragm the next couple of days until seen by gastroenterology Please avoid any physical or strenuous activity Please continue to monitor symptoms closely and if symptoms are to worsen or change (fever greater than 101, chills, nausea, vomiting, chest pain, short of breath, difficulty breathing, numbness, tingling, black for stools, increased blood in your stools, inability to keep any food or fluids down, worsening or changes to abdominal pain) please report back to the ED immediately  Colitis Colitis is inflammation of the colon. Colitis can be a short-term or long-standing (chronic) illness. Crohn's disease and ulcerative colitis are 2 types of colitis which are chronic. They usually require lifelong treatment. CAUSES  There are many different causes of colitis, including:  Viruses.  Germs (bacteria).  Medicine reactions. SYMPTOMS   Diarrhea.  Intestinal bleeding.  Pain.  Fever.  Throwing up (vomiting).  Tiredness (fatigue).  Weight loss.  Bowel blockage. DIAGNOSIS  The diagnosis of colitis is based on examination and stool or blood tests. X-rays, CT scan, and colonoscopy may also be needed. TREATMENT  Treatment may include:  Fluids given through the vein (intravenously).  Bowel rest (nothing to  eat or drink for a period of time).  Medicine for pain and diarrhea.  Medicines (antibiotics) that kill germs.  Cortisone medicines.  Surgery. HOME CARE INSTRUCTIONS   Get plenty of rest.  Drink enough water and fluids to keep your urine clear or pale yellow.  Eat a well-balanced diet.  Call your caregiver for follow-up as recommended. SEEK IMMEDIATE MEDICAL CARE IF:   You develop chills.  You have an oral temperature above 102 F (38.9 C), not controlled by medicine.  You have extreme weakness, fainting, or dehydration.  You have repeated vomiting.  You develop severe belly (abdominal) pain or are passing bloody or tarry stools. MAKE SURE YOU:   Understand these instructions.  Will watch your condition.  Will get help right away if you are not doing well or get worse. Document Released: 01/22/2005 Document Revised: 03/08/2012 Document Reviewed: 04/19/2010 [facility] Patient Information 2014 [place], [place].  Diet The clear liquid diet consists of foods that are liquid or will become liquid at room temperature. Examples of foods allowed on a clear liquid diet include fruit juice, broth or bouillon, gelatin, or frozen ice pops. You should be able to see through the liquid. The purpose of this diet is to provide the necessary fluids, electrolytes (such as sodium and potassium), and energy to keep the body functioning during times when you are not able to consume a regular diet. A clear liquid diet should not be continued for long periods of time, as it is not nutritionally adequate.  A CLEAR LIQUID DIET MAY BE NEEDED:  When a sudden-onset (acute) condition occurs before or after surgery.   As the first step in oral feeding.   For fluid and  electrolyte replacement in diarrheal diseases.   As a diet before certain medical tests are performed.  ADEQUACY The clear liquid diet is adequate only in ascorbic acid, according to the Recommended Dietary Allowances of the  [place].  CHOOSING FOODS Breads and Starches  Allowed: None are allowed.   Avoid: All are to be avoided.  Vegetables  Allowed: Strained vegetable juices.   Avoid: Any others.  Fruit  Allowed: Strained fruit juices and fruit drinks. Include 1 serving of citrus or vitamin C-enriched fruit juice daily.   Avoid: Any others.  Meat and Meat Substitutes  Allowed: None are allowed.   Avoid: All are to be avoided.  Milk Products  Allowed: None are allowed.   Avoid: All are to be avoided.  Soups and Combination Foods  Allowed: Clear bouillon, broth, or strained broth-based soups.   Avoid: Any others.  Desserts and Sweets  Allowed: Sugar, honey. High-protein gelatin. Flavored gelatin, ices, or frozen ice pops that do not contain milk.   Avoid: Any others.  Fats and Oils  Allowed: None are allowed.   Avoid: All are to be avoided.  Beverages  Allowed: Cereal beverages, coffee (regular or decaffeinated), tea, or soda at the discretion of your health care provider.   Avoid: Any others.  Condiments  Allowed: Salt.   Avoid: Any others, including pepper.  Supplements  Allowed: Liquid nutrition beverages that you can see through.   Avoid: Any others that contain lactose or fiber. SAMPLE MEAL PLAN Breakfast  4 oz (120 mL) strained orange juice.   to 1 cup (120 to 240 mL) gelatin (plain or fortified).  1 cup (240 mL) beverage (coffee or tea).  Sugar, if desired. Midmorning Snack   cup (120 mL) gelatin (plain or fortified). Lunch  1 cup (240 mL) broth or consomm.  4 oz (120 mL) strained grapefruit juice.   cup (120 mL) gelatin (plain or fortified).  1 cup (240 mL) beverage (coffee or tea).  Sugar, if desired. Midafternoon Snack   cup (120 mL) fruit ice.   cup (120 mL) strained fruit juice. Dinner  1 cup (240 mL) broth or consomm.   cup (120 mL) cranberry juice.   cup  (120 mL) flavored gelatin (plain or fortified).  1 cup (240 mL) beverage (coffee or tea).  Sugar, if desired. Evening Snack  4 oz (120 mL) strained apple juice (vitamin C-fortified).   cup (120 mL) flavored gelatin (plain or fortified). MAKE SURE YOU:  Understand these instructions.  Will watch your child's condition.  Will get help right away if your child is not doing well or gets worse. Document Released: 12/15/2005 Document Revised: 08/17/2013 Document Reviewed: 05/17/2013 [facility] Patient Information 2014 [place].

## 2014-04-23 NOTE — ED Notes (Signed)
Discharge instructions reviewed with pt. Pt verbalized understanding.   

## 2014-04-23 NOTE — ED Provider Notes (Signed)
CSN: [id number]     Arrival date & time 04/23/14  1001 History   First MD Initiated Contact with Patient 04/23/14 1109     Chief Complaint  Patient presents with  . Emesis     (Consider location/radiation/quality/duration/timing/severity/associated sxs/prior Treatment) The history is provided by the patient. No language interpreter was used.  Jennifer Moss is a 63 year-old female past medical history hypertension, hyperlipidemia, shortness of breath, angina, chronic kidney disease, arthritis, rectal bleeding, drug abuse presenting to the ED with ongoing abdominal pain and constipation for the past 2 weeks. Patient reports that her constipation has been intermittent for the past 3 weeks, stated last bowel movement was Thursday and had at least 2 bowel movements this morning each consisting of bright red blood when she wiped. Reported that she's been using to collapse for constipation. Stated that she's been experiencing left-sided abdominal pain described as a cramping sensation with the right upper quadrant sharp pain that is intermittent with underlying constant cramping sensation. Stated that when she palpates her left side of her abdomen it is tender to the touch. Stated that last week she had melenic stools. Reported that overnight she became nauseous and had at least 3-4 episodes of emesis mainly of food contents-NB/NB. Patient reported that she has a history of diverticulitis was diagnosed back in December 2014 where she was admitted to the hospital. Stated that she is able to keep food and fluids down without difficulty. Denied chest pain, shortness of breath, difficulty breathing, urinary symptoms, hematuria, fever, chills, stomach surgeries. PCP Dr. Ardelia Mems GI none  Past Medical History  Diagnosis Date  . Hypertension   . Hyperlipemia   . Anginal pain   . Shortness of breath   . Chronic kidney disease     rt kidney horseshoe  . Arthritis   . Retinal detachment     R eye, per  patient  . Mallory-Weiss tear   . Diverticulitis   . Rectal bleed   . Drug abuse    Past Surgical History  Procedure Laterality Date  . Diagnostic laparoscopy      for horseshoe kidney  . Appendectomy    . Lipoma removal      in 1990s had an egg-sized tumor removed from her leg, she thinks it was a lipoma  . Repair of detached retina    . Esophagogastroduodenoscopy  12/17/2012    Procedure: ESOPHAGOGASTRODUODENOSCOPY (EGD);  Surgeon: Wonda Horner, MD;  Location: [facility] ENDOSCOPY;  Service: Endoscopy;  Laterality: N/A;   Family History  Problem Relation Age of Onset  . Coronary artery disease Maternal Grandmother   . Coronary artery disease Maternal Uncle    History  Substance Use Topics  . Smoking status: Current Some Day Smoker -- 0.10 packs/day for 40 years    Types: Cigarettes  . Smokeless tobacco: Never Used     Comment: trying to quitt  . Alcohol Use: No     Comment: occ   OB History   Grav Para Term Preterm Abortions TAB SAB Ect Mult Living   1 1             Review of Systems  Constitutional: Negative for fever and chills.  Respiratory: Negative for chest tightness and shortness of breath.   Cardiovascular: Negative for chest pain.  Gastrointestinal: Positive for nausea, vomiting, abdominal pain, constipation and blood in stool. Negative for diarrhea and anal bleeding.  Genitourinary: Negative for dysuria, hematuria, decreased urine volume, vaginal bleeding, vaginal discharge and vaginal  pain.  Musculoskeletal: Negative for back pain and neck pain.  Neurological: Negative for dizziness, weakness and light-headedness.  All other systems reviewed and are negative.     Allergies  Review of patient's allergies indicates no known allergies.  Home Medications   Prior to Admission medications   Medication Sig Start Date End Date Taking? Authorizing Provider  albuterol (PROVENTIL HFA;VENTOLIN HFA) 108 (90 BASE) MCG/ACT inhaler Inhale 2 puffs into the lungs every 6  (six) hours as needed for wheezing or shortness of breath. 04/01/13   Leeanne Rio, MD  amLODipine (NORVASC) 10 MG tablet Take 1 tablet (10 mg total) by mouth daily. 11/28/13   Leeanne Rio, MD  aspirin EC 81 MG EC tablet Take 1 tablet (81 mg total) by mouth daily. 01/19/13   Angelica Ran, MD  cholecalciferol (VITAMIN D) 1000 UNITS tablet Take 1,000 Units by mouth daily.    Historical Provider, MD  lisinopril (PRINIVIL,ZESTRIL) 20 MG tablet Take 1 tablet (20 mg total) by mouth daily. 11/28/13   Leeanne Rio, MD  omega-3 acid ethyl esters (LOVAZA) 1 G capsule Take 1 g by mouth 2 (two) times daily.    Historical Provider, MD  polyethylene glycol powder (GLYCOLAX/MIRALAX) powder Take 17 g by mouth daily. 01/28/13   Leeanne Rio, MD  pravastatin (PRAVACHOL) 40 MG tablet Take 1 tablet (40 mg total) by mouth daily. 11/28/13   Leeanne Rio, MD   BP 153/99  Pulse 73  Temp(Src) 97.9 F (36.6 C) (Oral)  Resp 20  Wt 140 lb (63.504 kg)  SpO2 100% Physical Exam  Nursing note and vitals reviewed. Constitutional: She is oriented to person, place, and time. She appears well-developed and well-nourished. No distress.  Patient sitting comfortably upright in bed  HENT:  Head: Normocephalic and atraumatic.  Mouth/Throat: Oropharynx is clear and moist. No oropharyngeal exudate.  Eyes: Conjunctivae and EOM are normal. Pupils are equal, round, and reactive to light. Right eye exhibits no discharge. Left eye exhibits no discharge.  Neck: Normal range of motion. Neck supple. No tracheal deviation present.  Cardiovascular: Normal rate, regular rhythm and normal heart sounds.  Exam reveals no friction rub.   No murmur heard. Pulses:      Radial pulses are 2+ on the right side, and 2+ on the left side.       Dorsalis pedis pulses are 2+ on the right side, and 2+ on the left side.  Pulmonary/Chest: Effort normal and breath sounds normal. No respiratory distress. She has no  wheezes. She has no rales.  Abdominal: Soft. Bowel sounds are normal. She exhibits no distension. There is tenderness in the left lower quadrant. There is guarding and positive Murphy's sign. There is no rebound and no tenderness at McBurney's point.    Negative abdominal distention Bowel sounds normal active in all 4 quadrants Soft abdomen Discomfort upon palpation to the left lower quadrant, positive guarding when this area is palpated Area of ecchymosis to the left lower quadrant-patient denied trauma Negative McBurney's point Positive Murphy's sign  Genitourinary:  Rectal exam: Negative swelling, erythema, inflammation, lesions, sores, hemorrhage identified to the anus. Negative active bleeding or fissures noted. Negative masses or polyps palpated to the rectum. Negative stool palpated in the rectum. Blood-tinged mucus on glove. Exam chaperoned with nurse  Musculoskeletal: Normal range of motion. She exhibits no tenderness.  Lymphadenopathy:    She has no cervical adenopathy.  Neurological: She is alert and oriented to person, place, and time. No  cranial nerve deficit. She exhibits normal muscle tone. Coordination normal.  Skin: Skin is warm and dry. No rash noted. She is not diaphoretic. No erythema.  Psychiatric: She has a normal mood and affect. Her behavior is normal. Thought content normal.    ED Course  Procedures (including critical care time)  7:05 PM This provider discussed labs and imaging in great detail with patient. Discussed plan for IV antibiotics to be administered. Discussed with patient the need for GI to be on board and for follow-up with PCP and GI as an outpatient. Patient understood and agreed.   Results for orders placed during the hospital encounter of 04/23/14  CBC WITH DIFFERENTIAL      Result Value Ref Range   WBC 12.6 (*) 4.0 - 10.5 K/uL   RBC 6.00 (*) 3.87 - 5.11 MIL/uL   Hemoglobin 17.0 (*) 12.0 - 15.0 g/dL   HCT 50.0 (*) 36.0 - 46.0 %   MCV 83.3   78.0 - 100.0 fL   MCH 28.3  26.0 - 34.0 pg   MCHC 34.0  30.0 - 36.0 g/dL   RDW 14.0  11.5 - 15.5 %   Platelets 304  150 - 400 K/uL   Neutrophils Relative % 75  43 - 77 %   Neutro Abs 9.4 (*) 1.7 - 7.7 K/uL   Lymphocytes Relative 20  12 - 46 %   Lymphs Abs 2.5  0.7 - 4.0 K/uL   Monocytes Relative 3  3 - 12 %   Monocytes Absolute 0.4  0.1 - 1.0 K/uL   Eosinophils Relative 2  0 - 5 %   Eosinophils Absolute 0.2  0.0 - 0.7 K/uL   Basophils Relative 0  0 - 1 %   Basophils Absolute 0.0  0.0 - 0.1 K/uL  COMPREHENSIVE METABOLIC PANEL      Result Value Ref Range   Sodium 140  137 - 147 mEq/L   Potassium 4.0  3.7 - 5.3 mEq/L   Chloride 101  96 - 112 mEq/L   CO2 24  19 - 32 mEq/L   Glucose, Bld 92  70 - 99 mg/dL   BUN 16  6 - 23 mg/dL   Creatinine, Ser 1.49 (*) 0.50 - 1.10 mg/dL   Calcium 10.1  8.4 - 10.5 mg/dL   Total Protein 8.0  6.0 - 8.3 g/dL   Albumin 4.1  3.5 - 5.2 g/dL   AST 19  0 - 37 U/L   ALT 15  0 - 35 U/L   Alkaline Phosphatase 129 (*) 39 - 117 U/L   Total Bilirubin 0.3  0.3 - 1.2 mg/dL   GFR calc non Af Amer 37 (*) >90 mL/min   GFR calc Af Amer 43 (*) >90 mL/min  URINALYSIS, ROUTINE W REFLEX MICROSCOPIC      Result Value Ref Range   Color, Urine YELLOW  YELLOW   APPearance CLEAR  CLEAR   Specific Gravity, Urine 1.018  1.005 - 1.030   pH 5.5  5.0 - 8.0   Glucose, UA NEGATIVE  NEGATIVE mg/dL   Hgb urine dipstick NEGATIVE  NEGATIVE   Bilirubin Urine NEGATIVE  NEGATIVE   Ketones, ur NEGATIVE  NEGATIVE mg/dL   Protein, ur 100 (*) NEGATIVE mg/dL   Urobilinogen, UA 0.2  0.0 - 1.0 mg/dL   Nitrite NEGATIVE  NEGATIVE   Leukocytes, UA NEGATIVE  NEGATIVE  LIPASE, BLOOD      Result Value Ref Range   Lipase 21  11 -  59 U/L  TROPONIN I      Result Value Ref Range   Troponin I <0.30  <0.30 ng/mL  URINE MICROSCOPIC-ADD ON      Result Value Ref Range   Squamous Epithelial / LPF RARE  RARE  TROPONIN I      Result Value Ref Range   Troponin I <0.30  <0.30 ng/mL  POC OCCULT  BLOOD, ED      Result Value Ref Range   Fecal Occult Bld POSITIVE (*) NEGATIVE    Labs Review Labs Reviewed  CBC WITH DIFFERENTIAL - Abnormal; Notable for the following:    WBC 12.6 (*)    RBC 6.00 (*)    Hemoglobin 17.0 (*)    HCT 50.0 (*)    Neutro Abs 9.4 (*)    All other components within normal limits  COMPREHENSIVE METABOLIC PANEL - Abnormal; Notable for the following:    Creatinine, Ser 1.49 (*)    Alkaline Phosphatase 129 (*)    GFR calc non Af Amer 37 (*)    GFR calc Af Amer 43 (*)    All other components within normal limits  URINALYSIS, ROUTINE W REFLEX MICROSCOPIC - Abnormal; Notable for the following:    Protein, ur 100 (*)    All other components within normal limits  POC OCCULT BLOOD, ED - Abnormal; Notable for the following:    Fecal Occult Bld POSITIVE (*)    All other components within normal limits  LIPASE, BLOOD  TROPONIN I  URINE MICROSCOPIC-ADD ON  TROPONIN I    Imaging Review Ct Abdomen Pelvis Wo Contrast  04/23/2014   CLINICAL DATA:  Intermittent vomiting, lower abdominal pain, diarrhea  EXAM: CT ABDOMEN AND PELVIS WITHOUT CONTRAST  TECHNIQUE: Multidetector CT imaging of the abdomen and pelvis was performed following the standard protocol without intravenous contrast.  COMPARISON:  12/15/2012  FINDINGS: Lung bases are clear.  Liver is notable for are an 8 mm probable cyst along the posterior segment right hepatic lobe (series 21/ image 31), although new from prior CT.  Unenhanced spleen, pancreas, and adrenal glands are within normal limits.  Gallbladder is unremarkable. No intrahepatic or extrahepatic ductal dilatation.  Crossed fused renal ectopia.  No renal calculi or hydronephrosis.  No evidence of bowel obstruction. Prior appendectomy. Colonic diverticulosis, although without convincing inflammatory changes to suggest acute diverticulitis.  No evidence of abdominal aortic aneurysm.  No abdominopelvic ascites.  No suspicious abdominopelvic  lymphadenopathy.  Uterus is unremarkable.  No adnexal masses.  Bladder is within normal limits.  Degenerative changes of the visualized thoracolumbar spine.  IMPRESSION: Colonic diverticulosis, although without convincing inflammatory changes to suggest acute diverticulitis.  No evidence of bowel obstruction.  Prior appendectomy.  Crossed fused renal ectopia.   Electronically Signed   By: Julian Hy M.D.   On: 04/23/2014 16:32   US Abdomen Complete  04/23/2014   CLINICAL DATA:  Abdominal pain, appendectomy  EXAM: ULTRASOUND ABDOMEN COMPLETE  COMPARISON:  CT ABD/PELVIS W CM dated 12/15/2012  FINDINGS: Gallbladder:  No gallstones or wall thickening visualized. No sonographic Murphy sign noted.  Common bile duct:  Diameter: Normal at 3 mm.  Liver:  The small anechoic cysts in the subcapsular right hepatic lobe measures 10 mm. No intrahepatic duct dilatation. Normal parenchymal echogenicity.  IVC:  No abnormality visualized.  Pancreas:  Visualized portion unremarkable.  Spleen:  Size and appearance within normal limits.  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  No hydronephrosis.  Abdominal aorta:  No aneurysm visualized.  Other findings:  No free fluid  IMPRESSION: 1. Normal gallbladder. 2. No acute abdominal findings. 3. 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 No hydronephrosis.   Electronically Signed   By: Suzy Bouchard M.D.   On: 04/23/2014 15:17     EKG Interpretation   Date/Time:  Sunday April 23 2014 11:44:41 EDT Ventricular Rate:  62 PR Interval:  141 QRS Duration: 93 QT Interval:  448 QTC Calculation: 455 R Axis:   35 Text Interpretation:  Sinus rhythm Anterior infarct, old Sinus rhythm No  significant change since last tracing anterior injury pattern, old?  Abnormal ekg Confirmed by Carmin Muskrat  MD [phone]) on 04/23/2014  11:49:55 AM      MDM   Final diagnoses:  Colitis   Medications  0.9 %  sodium  chloride infusion ( Intravenous New Bag/Given 04/23/14 1416)  ciprofloxacin (CIPRO) IVPB 400 mg (400 mg Intravenous New Bag/Given 04/23/14 1905)  metroNIDAZOLE (FLAGYL) IVPB 500 mg (not administered)  sodium chloride 0.9 % bolus 1,000 mL (0 mLs Intravenous Stopped 04/23/14 1708)  ondansetron (ZOFRAN) injection 4 mg (4 mg Intravenous Given 04/23/14 1234)  iohexol (OMNIPAQUE) 300 MG/ML solution 50 mL (50 mLs Oral Contrast Given 04/23/14 1351)  morphine 4 MG/ML injection 4 mg (4 mg Intravenous Given 04/23/14 1402)   Filed Vitals:   04/23/14 1030  BP: 153/99  Pulse: 73  Temp: 97.9 F (36.6 C)  TempSrc: Oral  Resp: 20  Weight: 140 lb (63.504 kg)  SpO2: 100%    EKG noted normal sinus rhythm with a heart rate of 62 beats per minute-no significant changes from last tracing. First troponin negative elevation. Second troponin negative. CBC noted elevated white blood cell count of 12.6 with a left shift with neutrophils at 9.4. Elevated hemoglobin at 17.0 and hematocrit is 50. - patient appears to be hemoconcentrated, well-hydrated. CMP noted elevated creatinine at 1.49 - when compared to 4 months ago creatinine has decreased from 1.58. Elevated alkaline phosphatase at 129. Lipase negative elevation Fecal occult positive. Urinalysis negative for nitrites, negative leukocytes identified-negative hemoglobin noted. Abdominal ultrasound noted normal gallbladder-negative acute abdominal findings, cross fused ectopic kidney in the right lower quadrant, no hydronephrosis noted. CT abdomen and pelvis performed noted colonic diverticulosis without convincing inflammatory changes for acute diverticulitis. No evidence of bowel disruption. Appendectomy noted. Cross fused renal ectopia. Hemoglobin within normal limits-doubt acute bleed. Negative findings of diverticulitis. Negative bowel obstruction. Doubt cholangitis/cholecystitis. Doubt pancreatitis. With elevated white blood cell count and positive fecal occult will treat  patient for possible beginnings of colitis, early stages-patient has history of diverticulitis. Negative signs of perforation or obstruction noted to CT. Antibiotics administered in ED setting. Patient hydrated in ED setting. Pain controlled in ED setting. Patient able to tolerate food and fluids without difficulty-negative episodes of emesis while in ED setting. Patient stable, afebrile. Discharged patient. Discharge patient with Zofran and antibiotics. Discussed with patient to rest and stay hydrated. Referred patient to primary care provider to be reassessed within the next 24-48 hours. Discussed with patient to closely monitor symptoms and if symptoms are to worsen or change to report back to the ED - strict return instructions given.  Patient agreed to plan of care, understood, all questions answered.   Jamse Mead, PA-C 04/24/14 1139

## 2014-04-23 NOTE — ED Notes (Signed)
She states shes had intermittent vomiting and lower abd pain for past few weeks. This am she had diarrhea and it looked like there was blood in it. She is concerned because she has a hx of diverticulitis.

## 2014-04-26 ENCOUNTER — Ambulatory Visit (INDEPENDENT_AMBULATORY_CARE_PROVIDER_SITE_OTHER): Payer: No Typology Code available for payment source | Admitting: Family Medicine

## 2014-04-26 ENCOUNTER — Encounter: Payer: Self-pay | Admitting: Family Medicine

## 2014-04-26 VITALS — BP 167/96 | HR 83 | Temp 97.6°F | Wt 138.0 lb

## 2014-04-26 DIAGNOSIS — K625 Hemorrhage of anus and rectum: Secondary | ICD-10-CM

## 2014-04-26 DIAGNOSIS — Z0000 Encounter for general adult medical examination without abnormal findings: Secondary | ICD-10-CM

## 2014-04-26 DIAGNOSIS — K5790 Diverticulosis of intestine, part unspecified, without perforation or abscess without bleeding: Secondary | ICD-10-CM

## 2014-04-26 DIAGNOSIS — K5900 Constipation, unspecified: Secondary | ICD-10-CM

## 2014-04-26 DIAGNOSIS — K5730 Diverticulosis of large intestine without perforation or abscess without bleeding: Secondary | ICD-10-CM

## 2014-04-26 MED ORDER — DOCUSATE SODIUM 100 MG PO CAPS: 100.0000 mg | ORAL_CAPSULE | Freq: Two times a day (BID) | ORAL | Status: DC

## 2014-04-26 NOTE — Patient Instructions (Addendum)
It was nice to see you today.  Continue taking the antibiotics until finished.   Take the colace (stool softener) 2 times daily.  Also continue taking miralax as follows: take 2 cap fulls daily.  If you fail to have a soft, regular bowel movement you can increase in increments of 17 g (1 capful) until you have a regular soft, BM.  Follow up with Dr. Fortunato Curling in the next 1-3 months.

## 2014-04-26 NOTE — Assessment & Plan Note (Signed)
Patient still having some difficulty with constipation. Rx sent for Colace.  I also gave the patient instructions regarding titration of MiraLAX.

## 2014-04-26 NOTE — Progress Notes (Signed)
   Subjective:    Patient ID: Jennifer Moss, female    DOB: Mar 16, 1951, 63 y.o.   MRN: [id number]  HPI 63 year old female with hx of HTN, HLD, Constipation and diverticulitis presents for ED follow up.   Patient seen in the ED on 4/26 with complaints of nausea, vomiting, bright red blood rectum, abdominal pain, and intermittent constipation.  Patient was evaluated and laboratory studies revealed hemoconcentration, normal LFTs and lipase, and mild leukocytosis.  Given abdominal pain, abdominal ultrasound and CT scan were obtained.  Ultrasound was negative for cholelithiasis and cholecystitis.  CT scan revealed diverticulosis but no inflammatory changes suggestive of colitis.  Patient was treated with IV fluids and IV antibiotics and discharge home with instructions to followup with her PCP.  Patient reports that she is currently feeling much better. Her abdominal pain is only occasional and mild in severity (3/10).  When she has the abdominal pain occurs in the lower abdomen.  She denies any additional episodes of BRBPR and also denies melena. She's had no further nausea and vomiting and is now having good appetite.  No recent fevers, chills.  Patient reports she had a small bowel movement earlier today which was soft and easy to pass.    Review of Systems Per HPI    Objective:   Physical Exam Filed Vitals:   04/26/14 0832  BP: 167/96  Pulse: 83  Temp: 97.6 F (36.4 C)   Exam: General: well appearing, NAD. Cardiovascular: RRR. No murmurs, rubs, or gallops. Respiratory: CTAB. No rales, rhonchi, or wheeze. Abdomen: soft, nontender, nondistended.  No palpable organomegaly.    Assessment & Plan:  See problem list

## 2014-04-26 NOTE — ED Provider Notes (Signed)
  This was a shared visit with a mid-level provided (NP or PA).  Throughout the patient's course I was available for consultation/collaboration.  I saw the ECG (if appropriate), relevant labs and studies - I agree with the interpretation.  On my exam the patient was in no distress.  On my exam she had pain in both the left lower quadrant, and right upper quadrant.  Given her description of n/v, abd pain a broad differential was considered and the patient was in the ED for a prolonged time. She was improved enough that she could eat / drink and was in no distress.  She was d/c to f/u w GI.      Carmin Muskrat, MD 04/26/14 2201

## 2014-04-26 NOTE — Assessment & Plan Note (Signed)
Patient is in need of screening colonoscopy (especially given recent BRBPR). Patient has the orange card and currently no GI physician in our area does colonoscopies in those with orange card (after discussion with attending Dr. Nori Riis and [facility] referral coordinator Maurice March). Patient does not want to do Hemocult cards.  Will discuss this further with faculty to see what options we have regarding screening colonoscopy in those with orange card.

## 2014-04-26 NOTE — Assessment & Plan Note (Signed)
Likely secondary to diverticulosis. This is now resolved. Advise high fiber diet.

## 2014-05-26 ENCOUNTER — Ambulatory Visit: Payer: No Typology Code available for payment source | Admitting: Family Medicine

## 2014-06-20 ENCOUNTER — Ambulatory Visit: Payer: No Typology Code available for payment source

## 2014-06-22 ENCOUNTER — Ambulatory Visit (INDEPENDENT_AMBULATORY_CARE_PROVIDER_SITE_OTHER): Payer: No Typology Code available for payment source | Admitting: Family Medicine

## 2014-06-22 ENCOUNTER — Encounter: Payer: Self-pay | Admitting: Family Medicine

## 2014-06-22 VITALS — BP 151/117 | HR 82 | Temp 98.1°F | Wt 131.0 lb

## 2014-06-22 DIAGNOSIS — I10 Essential (primary) hypertension: Secondary | ICD-10-CM

## 2014-06-22 DIAGNOSIS — J4520 Mild intermittent asthma, uncomplicated: Secondary | ICD-10-CM

## 2014-06-22 DIAGNOSIS — E785 Hyperlipidemia, unspecified: Secondary | ICD-10-CM

## 2014-06-22 DIAGNOSIS — N183 Chronic kidney disease, stage 3 unspecified: Secondary | ICD-10-CM

## 2014-06-22 DIAGNOSIS — M799 Soft tissue disorder, unspecified: Secondary | ICD-10-CM

## 2014-06-22 DIAGNOSIS — M7989 Other specified soft tissue disorders: Secondary | ICD-10-CM

## 2014-06-22 DIAGNOSIS — J45909 Unspecified asthma, uncomplicated: Secondary | ICD-10-CM

## 2014-06-22 DIAGNOSIS — K625 Hemorrhage of anus and rectum: Secondary | ICD-10-CM

## 2014-06-22 MED ORDER — AMLODIPINE BESYLATE 10 MG PO TABS
10.0000 mg | ORAL_TABLET | Freq: Every day | ORAL | Status: DC
Start: 2014-06-22 — End: 2015-02-21

## 2014-06-22 MED ORDER — LISINOPRIL 20 MG PO TABS: 20.0000 mg | ORAL_TABLET | Freq: Every day | ORAL | Status: DC

## 2014-06-22 MED ORDER — ALBUTEROL SULFATE HFA 108 (90 BASE) MCG/ACT IN AERS: 2.0000 | INHALATION_SPRAY | Freq: Four times a day (QID) | RESPIRATORY_TRACT | Status: DC | PRN

## 2014-06-22 MED ORDER — PRAVASTATIN SODIUM 40 MG PO TABS
40.0000 mg | ORAL_TABLET | Freq: Every day | ORAL | Status: DC
Start: 2014-06-22 — End: 2015-02-22

## 2014-06-22 NOTE — Progress Notes (Signed)
Patient ID: Jennifer Moss, female   DOB: Jun 19, 1951, 63 y.o.   MRN: [id number]  HPI:  Rectal bleeding: Was seen in the emergency room for rectal bleeding. This was thought to be due to to diverticulosis, no indication of diverticulitis on CT scan. She [facility]. She's not had any further episodes of rectal bleeding. She has orange card and is unable to get a colonoscopy in our community due to not having a payer source. She's going to check with one of her friends to see if they can possibly give her a ride to [place].  Hypertension: Has been out of her blood pressure medicine for a week. Previously has been on lisinopril 20 mg daily, amlodipine 10 mg daily. She denies any chest pain, shortness of breath, or lower extremity edema.  Leg pain: Complains of pain in her bilateral thighs on the anterior surface. She has knots in her legs in her muscles. She also occasionally about every other day gets a shooting pain from her back down her right leg. She denies any saddle anesthesia, fevers, problems with bowel or bladder function.  Albuterol: Needs albuterol refilled because it is expired, uses rarely (<1/month)  ROS: See HPI  Jennifer Moss: History of neurofibromatosis type I, horseshoe kidney, Mallory-Weiss tear, CKD stage III, hypertension, asthma  PHYSICAL EXAM: BP 151/117  Pulse 82  Temp(Src) 98.1 F (36.7 C) (Oral)  Wt 131 lb (59.421 kg) Gen: No acute distress, pleasant and cooperative HEENT: Normocephalic, atraumatic Heart: Regular rate and rhythm, no murmurs Lungs: Clear to auscultation bilaterally, normal respiratory effort Abdomen: Soft, nontender to palpation, no masses or organomegaly. Neuro: Grossly nonfocal, speech normal, follows commands Extremities: No appreciable lower extremity edema bilaterally, and she does have some tender muscular nodules in her bilateral anterior thighs. No surrounding erythema, skin breakdown, or signs of  infection. Negative straight leg bilaterally. Good strength in bilateral lower extremities  ASSESSMENT/PLAN:  See problem based charting for additional assessment/plan.  FOLLOW UP: F/u in one to 2 weeks for nurse blood pressure check If BP is well-controlled at that visit, followup with me in 3 months.  Jennifer Moss. Ardelia Mems, Jennifer Moss

## 2014-06-22 NOTE — Assessment & Plan Note (Signed)
Stable, rarely requires albuterol. Refilled today as her inhaler is expired.

## 2014-06-22 NOTE — Assessment & Plan Note (Signed)
No further episodes of rectal bleeding. Patient does need colonoscopy for screening purposes. She has no care source that is able to obtain a colonoscopy in our community. Ideally we would refer her to [place]. Patient will check with her friend see if she will be able to get a ride there. If she can get transportation, will enter referral to [place].

## 2014-06-22 NOTE — Assessment & Plan Note (Signed)
BP elevated, likely secondary to being out of medications for one week. Will have her resume her medicines (refill today), and followup in one to two weeks for a nurse blood pressure check. If her blood pressure is well controlled at that time, she can followup with me in 3 months.

## 2014-06-22 NOTE — Patient Instructions (Addendum)
It was great to see you again today!  Followup in 2 weeks for nurse visit so we can recheck your blood pressure I refilled your medications  Try Tylenol as needed for pain. See the handout with back exercises.  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  I recommend you get a colonoscopy. Please call if you're able to go to [facility]. We can refer you to there.  Return to see me in 3 months.  Be well, Dr. Ardelia Mems   Back Exercises Back exercises help treat and prevent back injuries. The goal of back exercises is to increase the strength of your abdominal and back muscles and the flexibility of your back. These exercises should be started when you no longer have back pain. Back exercises include:  Pelvic Tilt. Lie on your back with your knees bent. Tilt your pelvis until the lower part of your back is against the floor. Hold this position 5 to 10 sec and repeat 5 to 10 times.  Knee to Chest. Pull first 1 knee up against your chest and hold for 20 to 30 seconds, repeat this with the other knee, and then both knees. This may be done with the other leg straight or bent, whichever feels better.  Sit-Ups or Curl-Ups. Bend your knees 90 degrees. Start with tilting your pelvis, and do a partial, slow sit-up, lifting your trunk only 30 to 45 degrees off the floor. Take at least 2 to 3 seconds for each sit-up. Do not do sit-ups with your knees out straight. If partial sit-ups are difficult, simply do the above but with only tightening your abdominal muscles and holding it as directed.  Hip-Lift. Lie on your back with your knees flexed 90 degrees. Push down with your feet and shoulders as you raise your hips a couple inches off the floor; hold for 10 seconds, repeat 5 to 10 times.  Back arches. Lie on your stomach, propping yourself up on bent elbows. Slowly press on your hands, causing an arch in your low back. Repeat 3 to 5 times. Any initial stiffness and discomfort should lessen with  repetition over time.  Shoulder-Lifts. Lie face down with arms beside your body. Keep hips and torso pressed to floor as you slowly lift your head and shoulders off the floor. Do not overdo your exercises, especially in the beginning. Exercises may cause you some mild back discomfort which lasts for a few minutes; however, if the pain is more severe, or lasts for more than 15 minutes, do not continue exercises until you see your caregiver. Improvement with exercise therapy for back problems is slow.  See your caregivers for assistance with developing a proper back exercise program. Document Released: 01/22/2005 Document Revised: 03/08/2012 Document Reviewed: 10/16/2011 [facility] Patient Information 2015 [place], [place]. This information is not intended to replace advice given to you by your health care provider. Make sure you discuss any questions you have with your health care provider.

## 2014-06-22 NOTE — Assessment & Plan Note (Addendum)
Continues to have small muscular nodules in her bilateral anterior thighs. 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 Patient has good strength in her bilateral lower extremities. We'll plan to just watch, and treat with Tylenol as needed for pain. Will also give handout with back exercises. There is no sign of infection at this time. Precepted with Dr. Lindell Noe who also examined patient and agrees with this plan.

## 2014-06-22 NOTE — Assessment & Plan Note (Signed)
Due for lipid check in December 2015. Refilled pravastatin today.

## 2014-06-22 NOTE — Assessment & Plan Note (Signed)
Creatinine stable when checked at ER visit in April 2015. Will recheck in 3 months.

## 2014-07-13 ENCOUNTER — Telehealth: Payer: Self-pay | Admitting: Family Medicine

## 2014-07-13 DIAGNOSIS — Z1211 Encounter for screening for malignant neoplasm of colon: Secondary | ICD-10-CM

## 2014-07-13 NOTE — Telephone Encounter (Signed)
Pt asking for appt to be set up for [place] for her colonoscopy.  Please contact her when scheduled.   Would like an early am appt.  Will have transportation there.

## 2014-07-13 NOTE — Telephone Encounter (Signed)
Spoke with patient and informed her that she had to get set up and approved through [place] assistance program before they will schedule colonoscopy. Patient to call [phone] to start approval process and they will take over referral from there. Patient expressed understanding.

## 2014-07-13 NOTE — Telephone Encounter (Signed)
Referral entered in epic. Please contact pt when appt set up. Thanks, Leeanne Rio, MD

## 2014-09-27 ENCOUNTER — Ambulatory Visit (INDEPENDENT_AMBULATORY_CARE_PROVIDER_SITE_OTHER): Payer: No Typology Code available for payment source | Admitting: *Deleted

## 2014-09-27 DIAGNOSIS — Z111 Encounter for screening for respiratory tuberculosis: Secondary | ICD-10-CM

## 2014-09-27 NOTE — Progress Notes (Signed)
   PPD placed Left Forearm.  Pt to return 48-72 hours for reading.  Pt tolerated intradermal injection. Derl Barrow, RN

## 2014-09-29 ENCOUNTER — Encounter: Payer: Self-pay | Admitting: *Deleted

## 2014-09-29 ENCOUNTER — Ambulatory Visit (INDEPENDENT_AMBULATORY_CARE_PROVIDER_SITE_OTHER): Payer: No Typology Code available for payment source | Admitting: *Deleted

## 2014-09-29 DIAGNOSIS — Z111 Encounter for screening for respiratory tuberculosis: Secondary | ICD-10-CM

## 2014-09-29 LAB — TB SKIN TEST
Induration: 0 mm
TB SKIN TEST: NEGATIVE

## 2014-09-29 NOTE — Progress Notes (Signed)
   PPD Reading Note PPD read and results entered in Epic[facility]. Result: 0 mm induration. Interpretation: Negative Martin, Tamika L, RN  

## 2014-10-11 ENCOUNTER — Ambulatory Visit: Payer: No Typology Code available for payment source

## 2014-10-19 ENCOUNTER — Ambulatory Visit: Payer: No Typology Code available for payment source

## 2014-11-09 ENCOUNTER — Ambulatory Visit: Payer: No Typology Code available for payment source

## 2014-12-23 ENCOUNTER — Emergency Department (HOSPITAL_COMMUNITY)
Admission: EM | Admit: 2014-12-23 | Discharge: 2014-12-24 | Disposition: A | Discharge status: Home / Self Care | Payer: Self-pay | Attending: Emergency Medicine | Admitting: Emergency Medicine

## 2014-12-23 ENCOUNTER — Encounter (HOSPITAL_COMMUNITY): Payer: Self-pay | Admitting: Emergency Medicine

## 2014-12-23 DIAGNOSIS — M545 Low back pain, unspecified: Secondary | ICD-10-CM

## 2014-12-23 DIAGNOSIS — I209 Angina pectoris, unspecified: Secondary | ICD-10-CM | POA: Insufficient documentation

## 2014-12-23 DIAGNOSIS — I129 Hypertensive chronic kidney disease with stage 1 through stage 4 chronic kidney disease, or unspecified chronic kidney disease: Secondary | ICD-10-CM | POA: Insufficient documentation

## 2014-12-23 DIAGNOSIS — N189 Chronic kidney disease, unspecified: Secondary | ICD-10-CM | POA: Insufficient documentation

## 2014-12-23 DIAGNOSIS — Z8669 Personal history of other diseases of the nervous system and sense organs: Secondary | ICD-10-CM | POA: Insufficient documentation

## 2014-12-23 DIAGNOSIS — Z7982 Long term (current) use of aspirin: Secondary | ICD-10-CM | POA: Insufficient documentation

## 2014-12-23 DIAGNOSIS — Q631 Lobulated, fused and horseshoe kidney: Secondary | ICD-10-CM | POA: Insufficient documentation

## 2014-12-23 DIAGNOSIS — Z79899 Other long term (current) drug therapy: Secondary | ICD-10-CM | POA: Insufficient documentation

## 2014-12-23 DIAGNOSIS — M1990 Unspecified osteoarthritis, unspecified site: Secondary | ICD-10-CM | POA: Insufficient documentation

## 2014-12-23 DIAGNOSIS — E785 Hyperlipidemia, unspecified: Secondary | ICD-10-CM | POA: Insufficient documentation

## 2014-12-23 DIAGNOSIS — Z8719 Personal history of other diseases of the digestive system: Secondary | ICD-10-CM | POA: Insufficient documentation

## 2014-12-23 NOTE — ED Provider Notes (Signed)
CSN: [id number]     Arrival date & time 12/23/14  2149 History  This chart was scribed for non-physician practitioner Charlann Lange, PA-C working with Everlene Balls, MD by Randa Evens, ED Scribe. This patient was seen in room [place] and the patient's care was started at 11:04 PM.      Chief Complaint  Patient presents with  . Back Pain   Patient is a 63 y.o. female presenting with back pain. The history is provided by the patient. No language interpreter was used.  Back Pain Associated symptoms: no abdominal pain and no dysuria    HPI Comments: AZZIE THIEM is a 63 y.o. female who presents to the Emergency Department complaining of worsening dull back pain onset 5 days prior. Pt denies any associated symptoms. Pt states that the pain is worse with movement. Pt states the pain is non radiating. Pt states she has tried bengay, epsom salt soak and heating pad with no relief. Pt states she has tried tylenol and aleve with no relief. Denies abdominal pain, nausea, vomiting, urinary frequency, hematuria, dysuria or other related symptoms.   Past Medical History  Diagnosis Date  . Hypertension   . Hyperlipemia   . Anginal pain   . Shortness of breath   . Chronic kidney disease     rt kidney horseshoe  . Arthritis   . Retinal detachment     R eye, per patient  . Mallory-Weiss tear   . Diverticulitis   . Rectal bleed   . Drug abuse   . Muscle spasms of both lower extremities    Past Surgical History  Procedure Laterality Date  . Diagnostic laparoscopy      for horseshoe kidney  . Appendectomy    . Lipoma removal      in 1990s had an egg-sized tumor removed from her leg, she thinks it was a lipoma  . Repair of detached retina    . Esophagogastroduodenoscopy  12/17/2012    Procedure: ESOPHAGOGASTRODUODENOSCOPY (EGD);  Surgeon: Wonda Horner, MD;  Location: [facility] ENDOSCOPY;  Service: Endoscopy;  Laterality: N/A;   Family History  Problem Relation Age of Onset  . Coronary artery  disease Maternal Grandmother   . Coronary artery disease Maternal Uncle   . Coronary artery disease Father   . Heart attack Father   . Asthma Father    History  Substance Use Topics  . Smoking status: Current Some Day Smoker -- 0.10 packs/day for 40 years    Types: Cigarettes  . Smokeless tobacco: Never Used     Comment: trying to quitt  . Alcohol Use: No     Comment: occ   OB History    Gravida Para Term Preterm AB TAB SAB Ectopic Multiple Living   1 1             Review of Systems  Gastrointestinal: Negative for nausea, vomiting and abdominal pain.  Genitourinary: Negative for dysuria, urgency and hematuria.  Musculoskeletal: Positive for back pain.  All other systems reviewed and are negative.    Allergies  Review of patient's allergies indicates no known allergies.  Home Medications   Prior to Admission medications   Medication Sig Start Date End Date Taking? Authorizing Provider  amLODipine (NORVASC) 10 MG tablet Take 1 tablet (10 mg total) by mouth daily. 06/22/14  Yes Leeanne Rio, MD  aspirin EC 81 MG EC tablet Take 1 tablet (81 mg total) by mouth daily. 01/19/13  Yes Angelica Ran, MD  cholecalciferol (VITAMIN D) 1000 UNITS tablet Take 1,000 Units by mouth daily.   Yes Historical Provider, MD  docusate sodium (COLACE) 100 MG capsule Take 1 capsule (100 mg total) by mouth 2 (two) times daily. 04/26/14  Yes Jayce G Cook, DO  lisinopril (PRINIVIL,ZESTRIL) 20 MG tablet Take 1 tablet (20 mg total) by mouth daily. 06/22/14  Yes Leeanne Rio, MD  omega-3 acid ethyl esters (LOVAZA) 1 G capsule Take 1 g by mouth 2 (two) times daily.   Yes Historical Provider, MD  pravastatin (PRAVACHOL) 40 MG tablet Take 1 tablet (40 mg total) by mouth daily. 06/22/14  Yes Leeanne Rio, MD  albuterol (PROVENTIL HFA;VENTOLIN HFA) 108 (90 BASE) MCG/ACT inhaler Inhale 2 puffs into the lungs every 6 (six) hours as needed for wheezing or shortness of breath. 06/22/14    Leeanne Rio, MD   Triage Vitals: BP 124/92 mmHg  Pulse 75  Temp(Src) 98.3 F (36.8 C) (Oral)  Resp 20  Wt 139 lb (63.05 kg)  SpO2 100%  Physical Exam  Constitutional: She is oriented to person, place, and time. She appears well-developed and well-nourished. No distress.  HENT:  Head: Normocephalic and atraumatic.  Eyes: Conjunctivae and EOM are normal.  Neck: Neck supple. No tracheal deviation present.  Cardiovascular: Normal rate.   Pulmonary/Chest: Effort normal. No respiratory distress.  Abdominal: There is no tenderness.  Musculoskeletal: Normal range of motion. She exhibits tenderness.  Right sided back tenderness from flank to lumbar region.   Neurological: She is alert and oriented to person, place, and time.  Skin: Skin is warm and dry.  Psychiatric: She has a normal mood and affect. Her behavior is normal.  Nursing note and vitals reviewed.   ED Course  Procedures (including critical care time) DIAGNOSTIC STUDIES: Oxygen Saturation is 100% on RA, normal by my interpretation.    COORDINATION OF CARE: 11:33 PM-Discussed treatment plan with pt at bedside and pt agreed to plan.     Labs Review Labs Reviewed - No data to display  Imaging Review No results found.   EKG Interpretation None      MDM   Final diagnoses:  None   1. Low back pain  No neurologic deficits. Exam supports muscular strain injury. Treat with supportive measures and PCP follow up.   I personally performed the services described in this documentation, which was scribed in my presence. The recorded information has been reviewed and is accurate.       Dewaine Oats, PA-C 12/25/14 2022  Everlene Balls, MD 12/25/14 2156

## 2014-12-23 NOTE — ED Notes (Signed)
Pt arrived to the ED with a complaint of right lower to mid level distal back pain.  Pt has had the pain since Monday.  Pt states's she has tried multiple home remedies but they have not elevate the pain.

## 2014-12-24 LAB — URINALYSIS, ROUTINE W REFLEX MICROSCOPIC
Bilirubin Urine: NEGATIVE
Glucose, UA: NEGATIVE mg/dL
Hgb urine dipstick: NEGATIVE
Ketones, ur: NEGATIVE mg/dL
Leukocytes, UA: NEGATIVE
NITRITE: NEGATIVE
PROTEIN: NEGATIVE mg/dL
Specific Gravity, Urine: 1.019 (ref 1.005–1.030)
UROBILINOGEN UA: 0.2 mg/dL (ref 0.0–1.0)
pH: 5.5 (ref 5.0–8.0)

## 2014-12-24 MED ORDER — CYCLOBENZAPRINE HCL 5 MG PO TABS: 5.0000 mg | ORAL_TABLET | Freq: Three times a day (TID) | ORAL | Status: DC

## 2014-12-24 MED ORDER — HYDROCODONE-ACETAMINOPHEN 5-325 MG PO TABS: 1.0000 | ORAL_TABLET | ORAL | Status: DC | PRN

## 2014-12-24 NOTE — Discharge Instructions (Signed)
Cryotherapy °Cryotherapy means treatment with cold. Ice or gel packs can be used to reduce both pain and swelling. Ice is the most helpful within the first 24 to 48 hours after an injury or flare-up from overusing a muscle or joint. Sprains, strains, spasms, burning pain, shooting pain, and aches can all be eased with ice. Ice can also be used when recovering from surgery. Ice is effective, has very few side effects, and is safe for most people to use. °PRECAUTIONS  °Ice is not a safe treatment option for people with: °· Raynaud phenomenon. This is a condition affecting small blood vessels in the extremities. Exposure to cold may cause your problems to return. °· Cold hypersensitivity. There are many forms of cold hypersensitivity, including: °¨ Cold urticaria. Red, itchy hives appear on the skin when the tissues begin to warm after being iced. °¨ Cold erythema. This is a red, itchy rash caused by exposure to cold. °¨ Cold hemoglobinuria. Red blood cells break down when the tissues begin to warm after being iced. The hemoglobin that carry oxygen are passed into the urine because they cannot combine with blood proteins fast enough. °· Numbness or altered sensitivity in the area being iced. °If you have any of the following conditions, do not use ice until you have discussed cryotherapy with your caregiver: °· Heart conditions, such as arrhythmia, angina, or chronic heart disease. °· High blood pressure. °· Healing wounds or open skin in the area being iced. °· Current infections. °· Rheumatoid arthritis. °· Poor circulation. °· Diabetes. °Ice slows the blood flow in the region it is applied. This is beneficial when trying to stop inflamed tissues from spreading irritating chemicals to surrounding tissues. However, if you expose your skin to cold temperatures for too long or without the proper protection, you can damage your skin or nerves. Watch for signs of skin damage due to cold. °HOME CARE INSTRUCTIONS °Follow  these tips to use ice and cold packs safely. °· Place a dry or damp towel between the ice and skin. A damp towel will cool the skin more quickly, so you may need to shorten the time that the ice is used. °· For a more rapid response, add gentle compression to the ice. °· Ice for no more than 10 to 20 minutes at a time. The bonier the area you are icing, the less time it will take to get the benefits of ice. °· Check your skin after 5 minutes to make sure there are no signs of a poor response to cold or skin damage. °· Rest 20 minutes or more between uses. °· Once your skin is numb, you can end your treatment. You can test numbness by very lightly touching your skin. The touch should be so light that you do not see the skin dimple from the pressure of your fingertip. When using ice, most people will feel these normal sensations in this order: cold, burning, aching, and numbness. °· Do not use ice on someone who cannot communicate their responses to pain, such as small children or people with dementia. °HOW TO MAKE AN ICE PACK °Ice packs are the most common way to use ice therapy. Other methods include ice massage, ice baths, and cryosprays. Muscle creams that cause a cold, tingly feeling do not offer the same benefits that ice offers and should not be used as a substitute unless recommended by your caregiver. °To make an ice pack, do one of the following: °· Place crushed ice or a   bag of frozen vegetables in a sealable plastic bag. Squeeze out the excess air. Place this bag inside another plastic bag. Slide the bag into a pillowcase or place a damp towel between your skin and the bag.  Mix 3 parts water with 1 part rubbing alcohol. Freeze the mixture in a sealable plastic bag. When you remove the mixture from the freezer, it will be slushy. Squeeze out the excess air. Place this bag inside another plastic bag. Slide the bag into a pillowcase or place a damp towel between your skin and the bag. SEEK MEDICAL CARE  IF:  You develop white spots on your skin. This may give the skin a blotchy (mottled) appearance.  Your skin turns blue or pale.  Your skin becomes waxy or hard.  Your swelling gets worse. MAKE SURE YOU:   Understand these instructions.  Will watch your condition.  Will get help right away if you are not doing well or get worse. Document Released: 08/11/2011 Document Revised: 05/01/2014 Document Reviewed: 08/11/2011 [facility] Patient Information 2015 [place], [place]. This information is not intended to replace advice given to you by your health care provider. Make sure you discuss any questions you have with your health care provider. Heat Therapy Heat therapy can help ease sore, stiff, injured, and tight muscles and joints. Heat relaxes your muscles, which may help ease your pain.  RISKS AND COMPLICATIONS If you have any of the following conditions, do not use heat therapy unless your health care provider has approved:  Poor circulation.  Healing wounds or scarred skin in the area being treated.  Diabetes, heart disease, or high blood pressure.  Not being able to feel (numbness) the area being treated.  Unusual swelling of the area being treated.  Active infections.  Blood clots.  Cancer.  Inability to communicate pain. This may include young children and people who have problems with their brain function (dementia).  Pregnancy. Heat therapy should only be used on old, pre-existing, or long-lasting (chronic) injuries. Do not use heat therapy on new injuries unless directed by your health care provider. HOW TO USE HEAT THERAPY There are several different kinds of heat therapy, including:  Moist heat pack.  Warm water bath.  Hot water bottle.  Electric heating pad.  Heated gel pack.  Heated wrap.  Electric heating pad. Use the heat therapy method suggested by your health care provider. Follow your health care provider's instructions on when and how to use heat  therapy. GENERAL HEAT THERAPY RECOMMENDATIONS  Do not sleep while using heat therapy. Only use heat therapy while you are awake.  Your skin may turn pink while using heat therapy. Do not use heat therapy if your skin turns red.  Do not use heat therapy if you have new pain.  High heat or long exposure to heat can cause burns. Be careful when using heat therapy to avoid burning your skin.  Do not use heat therapy on areas of your skin that are already irritated, such as with a rash or sunburn. SEEK MEDICAL CARE IF:  You have blisters, redness, swelling, or numbness.  You have new pain.  Your pain is worse. MAKE SURE YOU:  Understand these instructions.  Will watch your condition.  Will get help right away if you are not doing well or get worse. Document Released: 03/08/2012 Document Revised: 05/01/2014 Document Reviewed: 02/07/2014 [facility] Patient Information 2015 [place], [place]. This information is not intended to replace advice given to you by your health care provider.  Make sure you discuss any questions you have with your health care provider. Back Pain, Adult Low back pain is very common. About 1 in 5 people have back pain.The cause of low back pain is rarely dangerous. The pain often gets better over time.About half of people with a sudden onset of back pain feel better in just 2 weeks. About 8 in 10 people feel better by 6 weeks.  CAUSES Some common causes of back pain include:  Strain of the muscles or ligaments supporting the spine.  Wear and tear (degeneration) of the spinal discs.  Arthritis.  Direct injury to the back. DIAGNOSIS Most of the time, the direct cause of low back pain is not known.However, back pain can be treated effectively even when the exact cause of the pain is unknown.Answering your caregiver's questions about your overall health and symptoms is one of the most accurate ways to make sure the cause of your pain is not dangerous. If your  caregiver needs more information, he or she may order lab work or imaging tests (X-rays or MRIs).However, even if imaging tests show changes in your back, this usually does not require surgery. HOME CARE INSTRUCTIONS For many people, back pain returns.Since low back pain is rarely dangerous, it is often a condition that people can learn to [facility] their own.   Remain active. It is stressful on the back to sit or stand in one place. Do not sit, drive, or stand in one place for more than 30 minutes at a time. Take short walks on level surfaces as soon as pain allows.Try to increase the length of time you walk each day.  Do not stay in bed.Resting more than 1 or 2 days can delay your recovery.  Do not avoid exercise or work.Your body is made to move.It is not dangerous to be active, even though your back may hurt.Your back will likely heal faster if you return to being active before your pain is gone.  Pay attention to your body when you bend and lift. Many people have less discomfortwhen lifting if they bend their knees, keep the load close to their bodies,and avoid twisting. Often, the most comfortable positions are those that put less stress on your recovering back.  Find a comfortable position to sleep. Use a firm mattress and lie on your side with your knees slightly bent. If you lie on your back, put a pillow under your knees.  Only take over-the-counter or prescription medicines as directed by your caregiver. Over-the-counter medicines to reduce pain and inflammation are often the most helpful.Your caregiver may prescribe muscle relaxant drugs.These medicines help dull your pain so you can more quickly return to your normal activities and healthy exercise.  Put ice on the injured area.  Put ice in a plastic bag.  Place a towel between your skin and the bag.  Leave the ice on for 15-20 minutes, 03-04 times a day for the first 2 to 3 days. After that, ice and heat may be  alternated to reduce pain and spasms.  Ask your caregiver about trying back exercises and gentle massage. This may be of some benefit.  Avoid feeling anxious or stressed.Stress increases muscle tension and can worsen back pain.It is important to recognize when you are anxious or stressed and learn ways to manage it.Exercise is a great option. SEEK MEDICAL CARE IF:  You have pain that is not relieved with rest or medicine.  You have pain that does not improve in 1  week.  You have new symptoms.  You are generally not feeling well. SEEK IMMEDIATE MEDICAL CARE IF:   You have pain that radiates from your back into your legs.  You develop new bowel or bladder control problems.  You have unusual weakness or numbness in your arms or legs.  You develop nausea or vomiting.  You develop abdominal pain.  You feel faint. Document Released: 12/15/2005 Document Revised: 06/15/2012 Document Reviewed: 04/18/2014 [facility] Patient Information 2015 [place], [place]. This information is not intended to replace advice given to you by your health care provider. Make sure you discuss any questions you have with your health care provider.

## 2015-01-02 ENCOUNTER — Other Ambulatory Visit: Payer: Self-pay | Admitting: Family Medicine

## 2015-01-02 DIAGNOSIS — Z1231 Encounter for screening mammogram for malignant neoplasm of breast: Secondary | ICD-10-CM

## 2015-01-10 ENCOUNTER — Ambulatory Visit (HOSPITAL_COMMUNITY)
Admission: RE | Admit: 2015-01-10 | Discharge: 2015-01-10 | Disposition: A | Discharge status: Home / Self Care | Payer: No Typology Code available for payment source | Source: Ambulatory Visit | Attending: Internal Medicine | Admitting: Internal Medicine

## 2015-01-10 DIAGNOSIS — Z1231 Encounter for screening mammogram for malignant neoplasm of breast: Secondary | ICD-10-CM

## 2015-02-12 ENCOUNTER — Ambulatory Visit: Payer: No Typology Code available for payment source | Admitting: Family Medicine

## 2015-02-14 ENCOUNTER — Ambulatory Visit: Payer: No Typology Code available for payment source | Admitting: Family Medicine

## 2015-02-21 ENCOUNTER — Encounter: Payer: Self-pay | Admitting: Family Medicine

## 2015-02-21 ENCOUNTER — Ambulatory Visit (INDEPENDENT_AMBULATORY_CARE_PROVIDER_SITE_OTHER): Payer: No Typology Code available for payment source | Admitting: Family Medicine

## 2015-02-21 VITALS — BP 119/82 | HR 76 | Temp 98.0°F | Ht 60.0 in | Wt 141.5 lb

## 2015-02-21 DIAGNOSIS — N183 Chronic kidney disease, stage 3 unspecified: Secondary | ICD-10-CM

## 2015-02-21 DIAGNOSIS — Z114 Encounter for screening for human immunodeficiency virus [HIV]: Secondary | ICD-10-CM

## 2015-02-21 DIAGNOSIS — R0781 Pleurodynia: Secondary | ICD-10-CM

## 2015-02-21 DIAGNOSIS — Z1211 Encounter for screening for malignant neoplasm of colon: Secondary | ICD-10-CM

## 2015-02-21 DIAGNOSIS — I10 Essential (primary) hypertension: Secondary | ICD-10-CM

## 2015-02-21 DIAGNOSIS — E785 Hyperlipidemia, unspecified: Secondary | ICD-10-CM

## 2015-02-21 LAB — COMPREHENSIVE METABOLIC PANEL
ALT: 12 U/L (ref 0–35)
AST: 16 U/L (ref 0–37)
Albumin: 4.4 g/dL (ref 3.5–5.2)
Alkaline Phosphatase: 97 U/L (ref 39–117)
BUN: 21 mg/dL (ref 6–23)
CHLORIDE: 104 meq/L (ref 96–112)
CO2: 24 meq/L (ref 19–32)
CREATININE: 1.71 mg/dL — AB (ref 0.50–1.10)
Calcium: 9.5 mg/dL (ref 8.4–10.5)
Glucose, Bld: 86 mg/dL (ref 70–99)
Potassium: 5 mEq/L (ref 3.5–5.3)
SODIUM: 139 meq/L (ref 135–145)
TOTAL PROTEIN: 7.1 g/dL (ref 6.0–8.3)
Total Bilirubin: 0.4 mg/dL (ref 0.2–1.2)

## 2015-02-21 LAB — LIPID PANEL
Cholesterol: 202 mg/dL — ABNORMAL HIGH (ref 0–200)
HDL: 86 mg/dL (ref >=46)
LDL Cholesterol: 99 mg/dL (ref 0–99)
TRIGLYCERIDES: 83 mg/dL (ref <150)
Total CHOL/HDL Ratio: 2.3 Ratio
VLDL: 17 mg/dL (ref 0–40)

## 2015-02-21 MED ORDER — LISINOPRIL 20 MG PO TABS: 20.0000 mg | ORAL_TABLET | Freq: Every day | ORAL | Status: DC

## 2015-02-21 MED ORDER — AMLODIPINE BESYLATE 10 MG PO TABS: 10.0000 mg | ORAL_TABLET | Freq: Every day | ORAL | Status: DC

## 2015-02-21 NOTE — Patient Instructions (Signed)
Great to see you again today We're going to check a x-ray of your ribs. Try ice, heating pad, and Tylenol for pain control. I will see back in 6 weeks to reexamine your ribs. I sent in prescriptions for your blood pressure medicines I will refill your cholesterol medicine once we see the results of your cholesterol test  Be well, Dr. Ardelia Mems

## 2015-02-21 NOTE — Progress Notes (Signed)
Patient ID: Jennifer Moss, female   DOB: 1951/02/22, 64 y.o.   MRN: [id number]  HPI:  Rib pain: has pain on both sides of ribs. Constant dull pain for many months. Feels knots in her side, thinks the knots are getting worse/bigger. No fevers, weight loss, night sweats. No swelling in legs. Has occasional cramps in bottom of her feet.   HTN - currently taking amlodipine 10mg  daily, lisinopril 20mg  daily. Denies CP or SOB, no swelling in legs.  HLD - taking pravastatin 40mg  daily  ROS: See HPI.  [place]: hx HTN, cocaine use, CKD, chest pain, neurofibromatosis type 1, asthma, HLD, tobacco abuse  PHYSICAL EXAM: BP 119/82 mmHg  Pulse 76  Temp(Src) 98 F (36.7 C) (Oral)  Ht 5' (1.524 m)  Wt 141 lb 8 oz (64.184 kg)  BMI 27.63 kg/m2 Gen: NAD HEENT: NCAT Heart: RRR no murmurs Lungs: NWOB CTAB Chest wall: TTP over bilateral lower ribs. No large discernable masses palpable but some subcutaneous nodularity possibly palpable. Body habitus slightly limits exam. Neuro: grossly nonfocal speech normal Ext: No appreciable lower extremity edema bilaterally   ASSESSMENT/PLAN:  Health maintenance:  -check HIV today for routine screening since we are doing labwork -refer to Dr. Carlean Purl for screening colonoscopy (pt thinks he will take orange card and would like referral)  Hyperlipidemia Check lipids and CMET today. Titrate statin as needed.   Hypertension Well controlled. Continue current regimen. Checking renal fxn and electrolytes today.   Chronic kidney disease, stage 3 Recent normal UA without proteinuria in ER so will defer UA today. Check renal fxn today. Still continue with BMET and UA q73mo at minimum.   Rib pain I suspect this is related to her underlying neurofibromatosis type I. We will check rib xrays today. Try ice, heating pad, and Tylenol for pain control. F/u in appx 6 weeks to further eval. May benefit from CT chest, although I am hesitant to use contrast due to her stage 3  CKD.    FOLLOW UP: F/u in 6 weeks for rib pain  [place] J. Ardelia Mems, [place]

## 2015-02-22 ENCOUNTER — Ambulatory Visit
Admission: RE | Admit: 2015-02-22 | Discharge: 2015-02-22 | Disposition: A | Discharge status: Home / Self Care | Payer: No Typology Code available for payment source | Source: Ambulatory Visit | Attending: Family Medicine | Admitting: Family Medicine

## 2015-02-22 ENCOUNTER — Telehealth: Payer: Self-pay | Admitting: Family Medicine

## 2015-02-22 DIAGNOSIS — N183 Chronic kidney disease, stage 3 (moderate): Secondary | ICD-10-CM

## 2015-02-22 DIAGNOSIS — R0781 Pleurodynia: Secondary | ICD-10-CM

## 2015-02-22 LAB — HIV ANTIBODY (ROUTINE TESTING W REFLEX): HIV: NONREACTIVE

## 2015-02-22 MED ORDER — PRAVASTATIN SODIUM 40 MG PO TABS: 40.0000 mg | ORAL_TABLET | Freq: Every day | ORAL | Status: DC

## 2015-02-22 NOTE — Telephone Encounter (Signed)
Red team, please call pt and let her know the following:  1. Cholesterol looks good. HIV test negative. 2. Xray does not show any problems with her ribs, which is more evidence that her rib pain is likely due to the neurofibromatosis. 3. Her kidney function is a little more decreased than in the past. I'd like her to come in some time next week to recheck it. This can just be a lab appointment. 4. She should still follow up with me in about 6 weeks in the office.  Thanks, Leeanne Rio, MD

## 2015-02-23 NOTE — Telephone Encounter (Signed)
Pt informed. Made a lab appt for Monday. Janelie Goltz Kennon Holter, CMA

## 2015-02-24 DIAGNOSIS — R0781 Pleurodynia: Secondary | ICD-10-CM | POA: Insufficient documentation

## 2015-02-24 NOTE — Assessment & Plan Note (Signed)
Well controlled. Continue current regimen. Checking renal fxn and electrolytes today.

## 2015-02-24 NOTE — Assessment & Plan Note (Signed)
Check lipids and CMET today. Titrate statin as needed.  

## 2015-02-24 NOTE — Assessment & Plan Note (Addendum)
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 We will check rib xrays today. Try ice, heating pad, and Tylenol for pain control. F/u in appx 6 weeks to further eval. May benefit from CT chest, although I am hesitant to use contrast due to her stage 3 CKD.

## 2015-02-24 NOTE — Assessment & Plan Note (Signed)
Recent normal UA without proteinuria in ER so will defer UA today. Check renal fxn today. Still continue with BMET and UA q87mo at minimum.

## 2015-02-26 ENCOUNTER — Other Ambulatory Visit: Payer: No Typology Code available for payment source

## 2015-02-26 DIAGNOSIS — N183 Chronic kidney disease, stage 3 (moderate): Secondary | ICD-10-CM

## 2015-02-26 LAB — BASIC METABOLIC PANEL
BUN: 20 mg/dL (ref 6–23)
CHLORIDE: 110 meq/L (ref 96–112)
CO2: 25 meq/L (ref 19–32)
Calcium: 9.5 mg/dL (ref 8.4–10.5)
Creat: 1.74 mg/dL — ABNORMAL HIGH (ref 0.50–1.10)
GLUCOSE: 96 mg/dL (ref 70–99)
Potassium: 5.2 mEq/L (ref 3.5–5.3)
Sodium: 142 mEq/L (ref 135–145)

## 2015-02-26 NOTE — Progress Notes (Signed)
[facility] phlebotomist drew:  BMP 

## 2015-03-07 ENCOUNTER — Encounter: Payer: Self-pay | Admitting: Family Medicine

## 2015-03-07 ENCOUNTER — Telehealth: Payer: Self-pay

## 2015-03-07 NOTE — Telephone Encounter (Signed)
Pt was referred by Chrisandra Netters, MD for screening colonoscopy. I called and she is on her way to work Will call tomorrow.

## 2015-03-08 ENCOUNTER — Other Ambulatory Visit: Payer: Self-pay

## 2015-03-08 DIAGNOSIS — Z1211 Encounter for screening for malignant neoplasm of colon: Secondary | ICD-10-CM

## 2015-03-12 NOTE — Telephone Encounter (Signed)
Gastroenterology Pre-Procedure Review  Request Date: 03/08/2015 Requesting Physician: Chrisandra Netters, MD  PATIENT REVIEW QUESTIONS: The patient responded to the following health history questions as indicated:    1. Diabetes Melitis: no 2. Joint replacements in the past 12 months: no 3. Major health problems in the past 3 months: no 4. Has an artificial valve or MVP: no 5. Has a defibrillator: no 6. Has been advised in past to take antibiotics in advance of a procedure like teeth cleaning: no    MEDICATIONS & ALLERGIES:    Patient reports the following regarding taking any blood thinners:   Plavix? no Aspirin? YES Coumadin? no  Patient confirms/reports the following medications:  Current Outpatient Prescriptions  Medication Sig Dispense Refill  . albuterol (PROVENTIL HFA;VENTOLIN HFA) 108 (90 BASE) MCG/ACT inhaler Inhale 2 puffs into the lungs every 6 (six) hours as needed for wheezing or shortness of breath. 1 Inhaler 0  . amLODipine (NORVASC) 10 MG tablet Take 1 tablet (10 mg total) by mouth daily. 30 tablet 5  . aspirin EC 81 MG EC tablet Take 1 tablet (81 mg total) by mouth daily. 30 tablet 11  . cholecalciferol (VITAMIN D) 1000 UNITS tablet Take 1,000 Units by mouth daily.    Marland Kitchen docusate sodium (COLACE) 100 MG capsule Take 1 capsule (100 mg total) by mouth 2 (two) times daily. 60 capsule 6  . lisinopril (PRINIVIL,ZESTRIL) 20 MG tablet Take 1 tablet (20 mg total) by mouth daily. 30 tablet 5  . omega-3 acid ethyl esters (LOVAZA) 1 G capsule Take 1 g by mouth 2 (two) times daily.    . pravastatin (PRAVACHOL) 40 MG tablet Take 1 tablet (40 mg total) by mouth daily. 90 tablet 3   No current facility-administered medications for this visit.    Patient confirms/reports the following allergies:  No Known Allergies  No orders of the defined types were placed in this encounter.    AUTHORIZATION INFORMATION Primary Insurance:   ID #:  Group #:  Pre-Cert / Auth required:   Pre-Cert / Auth #:  Secondary Insurance:   ID #:  Group #:  Pre-Cert / Auth required:  Pre-Cert / Auth #:   SCHEDULE INFORMATION: Procedure has been scheduled as follows:  Date: 04/11/2015              Time:  8:30 AM Location: [facility] Short Stay  This Gastroenterology Pre-Precedure Review Form is being routed to the following provider(s): Barney Drain, MD

## 2015-03-13 NOTE — Telephone Encounter (Signed)
Routing to DS 

## 2015-03-13 NOTE — Telephone Encounter (Signed)
MOVI PREP SPLIT DOSING,  CLEAR LIQUIDS WITH BREAKFAST.  

## 2015-03-15 MED ORDER — PEG 3350-KCL-NA BICARB-NACL 420 G PO SOLR: 4000.0000 mL | ORAL | Status: DC

## 2015-03-15 NOTE — Telephone Encounter (Signed)
PT does not have Drug insurance. Trilyte prep sent to pharmacy and instructions mailed to pt.

## 2015-03-22 ENCOUNTER — Telehealth: Payer: Self-pay | Admitting: Family Medicine

## 2015-03-22 DIAGNOSIS — N189 Chronic kidney disease, unspecified: Secondary | ICD-10-CM

## 2015-03-22 NOTE — Telephone Encounter (Signed)
[place] red team, please call pt and let her know:  Her kidney function was still moderately decreased when we repeated her test. I am putting in a referral for her to see a kidney doctor. If she has questions I am happy to speak with her.  Thanks! Leeanne Rio, MD

## 2015-03-26 NOTE — Telephone Encounter (Signed)
Called patient, no answer, unable to leave message as mailbox disabled. Will try again later.

## 2015-03-26 NOTE — Telephone Encounter (Signed)
Patient informed, expressed understanding. 

## 2015-03-29 ENCOUNTER — Telehealth: Payer: Self-pay

## 2015-03-29 NOTE — Telephone Encounter (Signed)
I called pt to update triage. There has been no changes in her medications since she was triaged for the colonoscopy.

## 2015-03-29 NOTE — Telephone Encounter (Signed)
REVIEWED-NO ADDITIONAL RECOMMENDATIONS. 

## 2015-04-11 ENCOUNTER — Ambulatory Visit (HOSPITAL_COMMUNITY)
Admission: RE | Admit: 2015-04-11 | Discharge: 2015-04-11 | Disposition: A | Discharge status: Home / Self Care | Payer: No Typology Code available for payment source | Source: Ambulatory Visit | Attending: Gastroenterology | Admitting: Gastroenterology

## 2015-04-11 ENCOUNTER — Encounter (HOSPITAL_COMMUNITY): Payer: Self-pay | Admitting: *Deleted

## 2015-04-11 ENCOUNTER — Encounter (HOSPITAL_COMMUNITY)
Admission: RE | Disposition: A | Discharge status: Home / Self Care | Payer: Self-pay | Source: Ambulatory Visit | Attending: Gastroenterology

## 2015-04-11 DIAGNOSIS — K621 Rectal polyp: Secondary | ICD-10-CM | POA: Insufficient documentation

## 2015-04-11 DIAGNOSIS — I129 Hypertensive chronic kidney disease with stage 1 through stage 4 chronic kidney disease, or unspecified chronic kidney disease: Secondary | ICD-10-CM | POA: Insufficient documentation

## 2015-04-11 DIAGNOSIS — Z7982 Long term (current) use of aspirin: Secondary | ICD-10-CM | POA: Insufficient documentation

## 2015-04-11 DIAGNOSIS — Z9049 Acquired absence of other specified parts of digestive tract: Secondary | ICD-10-CM | POA: Insufficient documentation

## 2015-04-11 DIAGNOSIS — K644 Residual hemorrhoidal skin tags: Secondary | ICD-10-CM | POA: Insufficient documentation

## 2015-04-11 DIAGNOSIS — K6389 Other specified diseases of intestine: Secondary | ICD-10-CM | POA: Insufficient documentation

## 2015-04-11 DIAGNOSIS — K5730 Diverticulosis of large intestine without perforation or abscess without bleeding: Secondary | ICD-10-CM | POA: Insufficient documentation

## 2015-04-11 DIAGNOSIS — D123 Benign neoplasm of transverse colon: Secondary | ICD-10-CM | POA: Insufficient documentation

## 2015-04-11 DIAGNOSIS — N189 Chronic kidney disease, unspecified: Secondary | ICD-10-CM | POA: Insufficient documentation

## 2015-04-11 DIAGNOSIS — E785 Hyperlipidemia, unspecified: Secondary | ICD-10-CM | POA: Insufficient documentation

## 2015-04-11 DIAGNOSIS — Z1211 Encounter for screening for malignant neoplasm of colon: Secondary | ICD-10-CM | POA: Insufficient documentation

## 2015-04-11 DIAGNOSIS — F1410 Cocaine abuse, uncomplicated: Secondary | ICD-10-CM | POA: Insufficient documentation

## 2015-04-11 DIAGNOSIS — F1910 Other psychoactive substance abuse, uncomplicated: Secondary | ICD-10-CM | POA: Insufficient documentation

## 2015-04-11 DIAGNOSIS — D128 Benign neoplasm of rectum: Secondary | ICD-10-CM

## 2015-04-11 DIAGNOSIS — M1990 Unspecified osteoarthritis, unspecified site: Secondary | ICD-10-CM | POA: Insufficient documentation

## 2015-04-11 HISTORY — PX: COLONOSCOPY: SHX5424

## 2015-04-11 SURGERY — COLONOSCOPY
Anesthesia: Moderate Sedation

## 2015-04-11 MED ORDER — MEPERIDINE HCL 100 MG/ML IJ SOLN
INTRAMUSCULAR | Status: AC
  Filled 2015-04-11: qty 2

## 2015-04-11 MED ORDER — SODIUM CHLORIDE 0.9 % IJ SOLN
INTRAMUSCULAR | Status: AC
  Filled 2015-04-11: qty 3

## 2015-04-11 MED ORDER — MIDAZOLAM HCL 5 MG/5ML IJ SOLN
INTRAMUSCULAR | Status: DC | PRN
  Administered 2015-04-11 (×3): 1 mg via INTRAVENOUS
  Administered 2015-04-11: 2 mg via INTRAVENOUS
  Administered 2015-04-11: 1 mg via INTRAVENOUS

## 2015-04-11 MED ORDER — SIMETHICONE 40 MG/0.6ML PO SUSP
ORAL | Status: DC | PRN
  Administered 2015-04-11: 09:00:00

## 2015-04-11 MED ORDER — FENTANYL CITRATE 0.05 MG/ML IJ SOLN
INTRAMUSCULAR | Status: DC
Start: 2015-04-11 — End: 2015-04-11
  Filled 2015-04-11: qty 4

## 2015-04-11 MED ORDER — SODIUM CHLORIDE 0.9 % IV SOLN
INTRAVENOUS | Status: DC
  Administered 2015-04-11: 09:00:00 via INTRAVENOUS

## 2015-04-11 MED ORDER — PROMETHAZINE HCL 25 MG/ML IJ SOLN
INTRAMUSCULAR | Status: DC | PRN
  Administered 2015-04-11: 12.5 mg via INTRAVENOUS

## 2015-04-11 MED ORDER — FENTANYL CITRATE 0.05 MG/ML IJ SOLN
INTRAMUSCULAR | Status: DC | PRN
  Administered 2015-04-11: 50 ug via INTRAVENOUS
  Administered 2015-04-11 (×2): 25 ug via INTRAVENOUS

## 2015-04-11 MED ORDER — PROMETHAZINE HCL 25 MG/ML IJ SOLN
INTRAMUSCULAR | Status: AC
  Filled 2015-04-11: qty 1

## 2015-04-11 MED ORDER — MEPERIDINE HCL 100 MG/ML IJ SOLN: INTRAMUSCULAR | Status: DC | PRN

## 2015-04-11 MED ORDER — MIDAZOLAM HCL 5 MG/5ML IJ SOLN
INTRAMUSCULAR | Status: AC
  Filled 2015-04-11: qty 10

## 2015-04-11 NOTE — H&P (Addendum)
Primary Care Physician:  Chrisandra Netters, MD Primary Gastroenterologist:  Dr. Oneida Alar  Pre-Procedure History & Physical: HPI:  Jennifer Moss is a 64 y.o. female here for  [place].  Past Medical History  Diagnosis Date  . Hypertension   . Hyperlipemia   . Anginal pain   . Shortness of breath   . Chronic kidney disease     rt kidney horseshoe  . Arthritis   . Retinal detachment     R eye, per patient  . Mallory-Weiss tear   . Diverticulitis   . Rectal bleed   . Drug abuse   . Muscle spasms of both lower extremities     Past Surgical History  Procedure Laterality Date  . Diagnostic laparoscopy      for horseshoe kidney  . Appendectomy    . Lipoma removal      in 1990s had an egg-sized tumor removed from her leg, she thinks it was a lipoma  . Repair of detached retina    . Esophagogastroduodenoscopy  12/17/2012    Procedure: ESOPHAGOGASTRODUODENOSCOPY (EGD);  Surgeon: Wonda Horner, MD;  Location: [facility] ENDOSCOPY;  Service: Endoscopy;  Laterality: N/A;    Prior to Admission medications   Medication Sig Start Date End Date Taking? Authorizing Provider  amLODipine (NORVASC) 10 MG tablet Take 1 tablet (10 mg total) by mouth daily. 02/21/15  Yes Leeanne Rio, MD  aspirin EC 81 MG EC tablet Take 1 tablet (81 mg total) by mouth daily. 01/19/13  Yes Angelica Ran, MD  cholecalciferol (VITAMIN D) 1000 UNITS tablet Take 1,000 Units by mouth daily.   Yes Historical Provider, MD  docusate sodium (COLACE) 100 MG capsule Take 1 capsule (100 mg total) by mouth 2 (two) times daily. 04/26/14  Yes Jayce G Cook, DO  lisinopril (PRINIVIL,ZESTRIL) 20 MG tablet Take 1 tablet (20 mg total) by mouth daily. 02/21/15  Yes Leeanne Rio, MD  omega-3 acid ethyl esters (LOVAZA) 1 G capsule Take 1 g by mouth 2 (two) times daily.   Yes Historical Provider, MD  polyethylene glycol-electrolytes (TRILYTE) 420 G solution Take 4,000 mLs by mouth as directed. 03/15/15  Yes Danie Binder, MD  pravastatin (PRAVACHOL) 40 MG tablet Take 1 tablet (40 mg total) by mouth daily. 02/22/15  Yes Leeanne Rio, MD  albuterol (PROVENTIL HFA;VENTOLIN HFA) 108 (90 BASE) MCG/ACT inhaler Inhale 2 puffs into the lungs every 6 (six) hours as needed for wheezing or shortness of breath. 06/22/14   Leeanne Rio, MD    Allergies as of 03/08/2015  . (No Known Allergies)    Family History  Problem Relation Age of Onset  . Coronary artery disease Maternal Grandmother   . Coronary artery disease Maternal Uncle   . Coronary artery disease Father   . Heart attack Father   . Asthma Father   . Colon cancer Neg Hx     History   Social History  . Marital Status: Legally Separated    Spouse Name: N/A  . Number of Children: 1  . Years of Education: N/A   Occupational History  . Housekeeping    Social History Main Topics  . Smoking status: Never Smoker   . Smokeless tobacco: Never Used     Comment: trying to quitt  . Alcohol Use: 0.0 oz/week    0 Standard drinks or equivalent per week     Comment: occassional drink at holidays  . Drug Use: No  Comment: cocaine and THC + in UDS in November 2013. Last used 1 year ago as of 04/11/15  . Sexual Activity: No   Other Topics Concern  . Not on file   Social History Narrative   Works in [employer]; Lives with daughter (37 year old) in [place]       As of 04/01/13:   -Walks for exercise   -Eats healthy - greens, potato salad, baked chicken   -Works in [employer]   -Currently smokes 1 or 2 puffs of cigarettes "here or there" - about once per week   -Has history of smoking crack cocaine and is trying hard to stop. Has been going to cocaine support group. It's been hard because she takes the bus. When she feels the urge to smoke she prays, reads the Bible, or calls her sister for support. She does well if she stays active, but the times when she most wants to smoke crack is if she is bored at home.   -Also  uses marijuana every few months   -Has about two drinks monthly or less   -Denies any mood problems/depression symptoms    Review of Systems: See HPI, otherwise negative ROS   Physical Exam: BP 134/94 mmHg  Pulse 68  Temp(Src) 98.1 F (36.7 C) (Oral)  Resp 16  Ht 5' (1.524 m)  Wt 140 lb (63.504 kg)  BMI 27.34 kg/m2  SpO2 97% General:   Alert,  pleasant and cooperative in NAD Head:  Normocephalic and atraumatic. Neck:  Supple; Lungs:  Clear throughout to auscultation.    Heart:  Regular rate and rhythm. Abdomen:  Soft, nontender and nondistended. Normal bowel sounds, without guarding, and without rebound.   Neurologic:  Alert and  oriented x4;  grossly normal neurologically.  Impression/Plan:    COLON CANCER SCREENING.  PLAN: TCS TODAY

## 2015-04-11 NOTE — Discharge Instructions (Signed)
You had 4 small polyps removed. You have LARGE EXTERNAL HEMORRHOIDS and diverticulosis IN YOUR RIGHT AND LEFT COLON. YOU HAVE AND ANGULATED SIGMOID COLON MOST LIKELY DUE TO A PRIOR EPISODE OF DIVERTICULITIS.   FOLLOW A HIGH FIBER DIET. AVOID ITEMS THAT CAUSE BLOATING. SEE INFO BELOW.  YOUR BIOPSY RESULTS WILL BE AVAILABLE IN MY CHART AFTER APR 15 AND MY OFFICE WILL CONTACT YOU IN 10-14 DAYS WITH YOUR RESULTS.   Next colonoscopy in 5-10 years.   Colonoscopy Care After Read the instructions outlined below and refer to this sheet in the next week. These discharge instructions provide you with general information on caring for yourself after you leave the hospital. While your treatment has been planned according to the most current medical practices available, unavoidable complications occasionally occur. If you have any problems or questions after discharge, call DR. Jaycob Mcclenton, [phone].  ACTIVITY  You may resume your regular activity, but move at a slower pace for the next 24 hours.   Take frequent rest periods for the next 24 hours.   Walking will help get rid of the air and reduce the bloated feeling in your belly (abdomen).   No driving for 24 hours (because of the medicine (anesthesia) used during the test).   You may shower.   Do not sign any important legal documents or operate any machinery for 24 hours (because of the anesthesia used during the test).    NUTRITION  Drink plenty of fluids.   You may resume your normal diet as instructed by your doctor.   Begin with a light meal and progress to your normal diet. Heavy or fried foods are harder to digest and may make you feel sick to your stomach (nauseated).   Avoid alcoholic beverages for 24 hours or as instructed.    MEDICATIONS  You may resume your normal medications.   WHAT YOU CAN EXPECT TODAY  Some feelings of bloating in the abdomen.   Passage of more gas than usual.   Spotting of blood in your stool or  on the toilet paper  .  IF YOU HAD POLYPS REMOVED DURING THE COLONOSCOPY:  Eat a soft diet IF YOU HAVE NAUSEA, BLOATING, ABDOMINAL PAIN, OR VOMITING.    FINDING OUT THE RESULTS OF YOUR TEST Not all test results are available during your visit. DR. Oneida Alar WILL CALL YOU WITHIN 14 DAYS OF YOUR PROCEDUE WITH YOUR RESULTS. Do not assume everything is normal if you have not heard from DR. Immanuel Fedak, CALL HER OFFICE AT [phone].  SEEK IMMEDIATE MEDICAL ATTENTION AND CALL THE OFFICE: [phone] IF:  You have more than a spotting of blood in your stool.   Your belly is swollen (abdominal distention).   You are nauseated or vomiting.   You have a temperature over 101F.   You have abdominal pain or discomfort that is severe or gets worse throughout the day.  Polyps, Colon  A polyp is extra tissue that grows inside your body. Colon polyps grow in the large intestine. The large intestine, also called the colon, is part of your digestive system. It is a long, hollow tube at the end of your digestive tract where your body makes and stores stool. Most polyps are not dangerous. They are benign. This means they are not cancerous. But over time, some types of polyps can turn into cancer. Polyps that are smaller than a pea are usually not harmful. But larger polyps could someday become or may already be cancerous. To be safe, doctors  remove all polyps and test them.   WHO GETS POLYPS? Anyone can get polyps, but certain people are more likely than others. You may have a greater chance of getting polyps if:  You are over 50.   You have had polyps before.   Someone in your family has had polyps.   Someone in your family has had cancer of the large intestine.   Find out if someone in your family has had polyps. You may also be more likely to get polyps if you:   Eat a lot of fatty foods   Smoke   Drink alcohol   Do not exercise  Eat too much   TREATMENT  The caregiver will remove the polyp  during sigmoidoscopy or colonoscopy.  PREVENTION There is not one sure way to prevent polyps. You might be able to lower your risk of getting them if you:  Eat more fruits and vegetables and less fatty food.   Do not smoke.   Avoid alcohol.   Exercise every day.   Lose weight if you are overweight.   Eating more calcium and folate can also lower your risk of getting polyps. Some foods that are rich in calcium are milk, cheese, and broccoli. Some foods that are rich in folate are chickpeas, kidney beans, and spinach.   High-Fiber Diet A high-fiber diet changes your normal diet to include more whole grains, legumes, fruits, and vegetables. Changes in the diet involve replacing refined carbohydrates with unrefined foods. The calorie level of the diet is essentially unchanged. The Dietary Reference Intake (recommended amount) for adult males is 38 grams per day. For adult females, it is 25 grams per day. Pregnant and lactating women should consume 28 grams of fiber per day. Fiber is the intact part of a plant that is not broken down during digestion. Functional fiber is fiber that has been isolated from the plant to provide a beneficial effect in the body. PURPOSE  Increase stool bulk.   Ease and regulate bowel movements.   Lower cholesterol.  INDICATIONS THAT YOU NEED MORE FIBER  Constipation and hemorrhoids.   Uncomplicated diverticulosis (intestine condition) and irritable bowel syndrome.   Weight management.   As a protective measure against hardening of the arteries (atherosclerosis), diabetes, and cancer.   GUIDELINES FOR INCREASING FIBER IN THE DIET  Start adding fiber to the diet slowly. A gradual increase of about 5 more grams (2 slices of whole-wheat bread, 2 servings of most fruits or vegetables, or 1 bowl of high-fiber cereal) per day is best. Too rapid an increase in fiber may result in constipation, flatulence, and bloating.   Drink enough water and fluids to keep  your urine clear or pale yellow. Water, juice, or caffeine-free drinks are recommended. Not drinking enough fluid may cause constipation.   Eat a variety of high-fiber foods rather than one type of fiber.   Try to increase your intake of fiber through using high-fiber foods rather than fiber pills or supplements that contain small amounts of fiber.   The goal is to change the types of food eaten. Do not supplement your present diet with high-fiber foods, but replace foods in your present diet.  INCLUDE A VARIETY OF FIBER SOURCES  Replace refined and processed grains with whole grains, canned fruits with fresh fruits, and incorporate other fiber sources. White rice, white breads, and most bakery goods contain little or no fiber.   Brown whole-grain rice, buckwheat oats, and many fruits and vegetables are all  good sources of fiber. These include: broccoli, Brussels sprouts, cabbage, cauliflower, beets, sweet potatoes, white potatoes (skin on), carrots, tomatoes, eggplant, squash, berries, fresh fruits, and dried fruits.   Cereals appear to be the richest source of fiber. Cereal fiber is found in whole grains and bran. Bran is the fiber-rich outer coat of cereal grain, which is largely removed in refining. In whole-grain cereals, the bran remains. In breakfast cereals, the largest amount of fiber is found in those with "bran" in their names. The fiber content is sometimes indicated on the label.   You may need to include additional fruits and vegetables each day.   In baking, for 1 cup white flour, you may use the following substitutions:   1 cup whole-wheat flour minus 2 tablespoons.   1/2 cup white flour plus 1/2 cup whole-wheat flour.   Diverticulosis Diverticulosis is a common condition that develops when small pouches (diverticula) form in the wall of the colon. The risk of diverticulosis increases with age. It happens more often in people who eat a low-fiber diet. Most individuals with  diverticulosis have no symptoms. Those individuals with symptoms usually experience belly (abdominal) pain, constipation, or loose stools (diarrhea).  HOME CARE INSTRUCTIONS  Increase the amount of fiber in your diet as directed by your caregiver or dietician. This may reduce symptoms of diverticulosis.   Drink at least 6 to 8 glasses of water each day to prevent constipation.   Try not to strain when you have a bowel movement.   Avoiding nuts and seeds to prevent complications is NOT NECESSARY.   FOODS HAVING HIGH FIBER CONTENT INCLUDE:  Fruits. Apple, peach, pear, tangerine, raisins, prunes.   Vegetables. Brussels sprouts, asparagus, broccoli, cabbage, carrot, cauliflower, romaine lettuce, spinach, summer squash, tomato, winter squash, zucchini.   Starchy Vegetables. Baked beans, kidney beans, lima beans, split peas, lentils, potatoes (with skin).   Grains. Whole wheat bread, brown rice, bran flake cereal, plain oatmeal, white rice, shredded wheat, bran muffins.    SEEK IMMEDIATE MEDICAL CARE IF:  You develop increasing pain or severe bloating.   You have an oral temperature above 101F.   You develop vomiting or bowel movements that are bloody or black.   Hemorrhoids Hemorrhoids are dilated (enlarged) veins around the rectum. Sometimes clots will form in the veins. This makes them swollen and painful. These are called thrombosed hemorrhoids. Causes of hemorrhoids include:  Constipation.   Straining to have a bowel movement.   HEAVY LIFTING HOME CARE INSTRUCTIONS  Eat a well balanced diet and drink 6 to 8 glasses of water every day to avoid constipation. You may also use a bulk laxative.   Avoid straining to have bowel movements.   Keep anal area dry and clean.   Do not use a donut shaped pillow or sit on the toilet for long periods. This increases blood pooling and pain.   Move your bowels when your body has the urge; this will require less straining and will  decrease pain and pressure.

## 2015-04-11 NOTE — Op Note (Signed)
[facility] [address] [place], [address]   COLONOSCOPY PROCEDURE REPORT  PATIENT: Jennifer, Moss  MR#: [id number] BIRTHDATE: 08-09-1951 , 63  yrs. old GENDER: female ENDOSCOPIST: Danie Binder, MD REFERRED BY:   Chrisandra Netters, MD PROCEDURE DATE:  Apr 26, 2015 PROCEDURE:   Colonoscopy with cold biopsy polypectomy INDICATIONS:average risk patient for colon cancer. MEDICATIONS: Fentanyl 100 mcg IV, Versed 6 mg IV, and Promethazine (Phenergan) 12.5 mg IV  DESCRIPTION OF PROCEDURE:    Physical exam was performed.  Informed consent was obtained from the patient after explaining the benefits, risks, and alternatives to procedure.  The patient was connected to monitor and placed in left lateral position. Continuous oxygen was provided by nasal cannula and IV medicine administered through an indwelling cannula.  After administration of sedation and rectal exam, the patients rectum was intubated and the EC-[id number] ([id number])  colonoscope was advanced under direct visualization to the cecum.  The scope was removed slowly by carefully examining the color, texture, anatomy, and integrity mucosa on the way out.  The patient was recovered in endoscopy and discharged home in satisfactory condition.    COLON FINDINGS: There was severe diverticulosis noted throughout the entire examined colon with associated muscular hypertrophy, AND angulation and tortuosity IN TEH RECTOSIGMOID COLON. Four sessile polyps ranging from 2 to 49mm in size were found in the rectum, distal transverse colon(2), and proximal transverse colon(2).  A polypectomy was performed with cold forceps.  , and Large external hemorrhoids were found.  PREP QUALITY: good CECAL W/D TIME: 17       minutes   COMPLICATIONS: None  ENDOSCOPIC IMPRESSION: 1.   Severe diverticulosis throughout the entire examined colon 2.   Four COLON polyps REMOVED 3.   Large external hemorrhoids  RECOMMENDATIONS: HIGH FIBER  DIET AWAIT BIOPSY NEXT TCS IN 5 YEARS IF SIMPLE ADENOMAS AND 10 YEARS IF HYPEPRLASTIC.     _______________________________ eSignedDanie Binder, MD 04-26-2015 9:51 AM    CPT CODES: ICD CODES:  The ICD and CPT codes recommended by this software are interpretations from the data that the clinical staff has captured with the software.  The verification of the translation of this report to the ICD and CPT codes and modifiers is the sole responsibility of the health care institution and practicing physician where this report was generated.  [employer] will not be held responsible for the validity of the ICD and CPT codes included on this report.  AMA assumes no liability for data contained or not contained herein. CPT is a Designer, television/film set of the [employer].

## 2015-04-12 ENCOUNTER — Encounter (HOSPITAL_COMMUNITY): Payer: Self-pay | Admitting: Gastroenterology

## 2015-04-13 ENCOUNTER — Ambulatory Visit (INDEPENDENT_AMBULATORY_CARE_PROVIDER_SITE_OTHER): Payer: No Typology Code available for payment source | Admitting: Family Medicine

## 2015-04-13 ENCOUNTER — Encounter: Payer: Self-pay | Admitting: Family Medicine

## 2015-04-13 VITALS — BP 131/94 | HR 83 | Temp 97.8°F | Ht 60.0 in | Wt 145.6 lb

## 2015-04-13 DIAGNOSIS — R0781 Pleurodynia: Secondary | ICD-10-CM

## 2015-04-13 DIAGNOSIS — N183 Chronic kidney disease, stage 3 unspecified: Secondary | ICD-10-CM

## 2015-04-13 DIAGNOSIS — J4520 Mild intermittent asthma, uncomplicated: Secondary | ICD-10-CM

## 2015-04-13 LAB — BASIC METABOLIC PANEL
BUN: 17 mg/dL (ref 6–23)
CHLORIDE: 108 meq/L (ref 96–112)
CO2: 24 mEq/L (ref 19–32)
CREATININE: 1.61 mg/dL — AB (ref 0.50–1.10)
Calcium: 9.5 mg/dL (ref 8.4–10.5)
Glucose, Bld: 88 mg/dL (ref 70–99)
POTASSIUM: 4.9 meq/L (ref 3.5–5.3)
SODIUM: 142 meq/L (ref 135–145)

## 2015-04-13 NOTE — Assessment & Plan Note (Signed)
Discussed reasons why she needs to see nephrologist. Has not yet heard about this appointment. I will recheck her renal function today to ensure it continues to be stable. Will route message to red team regarding this referral.

## 2015-04-13 NOTE — Assessment & Plan Note (Signed)
No longer a big issue. Continue to monitor.

## 2015-04-13 NOTE — Patient Instructions (Signed)
Rechecking kidney function today Work on exercise and healthy eating If the shortness of breath does not improve please come back to see me. You should hear from our office about appointment with the kidney doctor  Be well, Dr. Ardelia Mems

## 2015-04-13 NOTE — Assessment & Plan Note (Signed)
Recent increase in dyspnea, which patient attributes to gaining weight. She has no edema or crackles on her respiratory exam, so I doubt this is a heart failure exacerbation. She does have a history of grade 1 diastolic dysfunction on an echo in 2014. The shortness of breath really does not seem to bother her, and patient strongly believes this is due to recent weight gain and being out of shape. She ambulated around clinic with O2 sat of 98%. At this time I don't think additional workup is necessary, unless the dyspnea were to persist or worsen. Patient will return for a visit if this is the case. Continue with as needed albuterol.

## 2015-04-13 NOTE — Progress Notes (Signed)
Patient ID: Jennifer Moss, female   DOB: Mar 09, 1951, 64 y.o.   MRN: [id number]  HPI:  Colonoscopy results: Patient request results of her colonoscopy. We reviewed the pathology. She had 2 tubular adenomas, and 2 hyperplastic polyps. Discussed that her GI physician will likely recommend that she have a repeat colonoscopy in 5 years, but I will defer final recommendations to them.  Rib soreness: Also here to follow-up on rib soreness. Her x-ray of her ribs was normal. The soreness remains, but is tolerable. It does not really bother her. Patient mentioned casually that she has been more short of breath recently. She attributes this to gaining weight. She uses her albuterol at night a few times per week. Does not require to every night. Denies having any chest pain. Has had normal stress test in the last few years. She is able to walk around [place] without distress.  Chronic kidney disease: We discussed why she needs to go see a renal doctor for her chronic kidney disease.  ROS: See HPI.  [place]: Hypertension, asthma, neurofibromatosis, chronic kidney disease, horseshoe kidney, intermittent cocaine use, diverticulosis  PHYSICAL EXAM: BP 131/94 mmHg  Pulse 83  Temp(Src) 97.8 F (36.6 C) (Oral)  Ht 5' (1.524 m)  Wt 145 lb 9.6 oz (66.044 kg)  BMI 28.44 kg/m2 Gen: No acute distress, pleasant, cooperative HEENT: Normocephalic, atraumatic, moist mucous membranes, no oral lesions Chest: Ribs nontender to palpation bilaterally Heart: Regular rate and rhythm, no murmur Lungs: Clear to auscultation bilaterally, normal respiratory effort Neuro: Grossly nonfocal, speech normal Ext: No edema in bilateral lower extremities  ASSESSMENT/PLAN:  Asthma Recent increase in dyspnea, which patient attributes to gaining weight. She has no edema or crackles on her respiratory exam, so I doubt this is a heart failure exacerbation. She does have a history of grade 1 diastolic dysfunction on an echo in 2014. The  shortness of breath really does not seem to bother her, and patient strongly believes this is due to recent weight gain and being out of shape. She ambulated around clinic with O2 sat of 98%. At this time I don't think additional workup is necessary, unless the dyspnea were to persist or worsen. Patient will return for a visit if this is the case. Continue with as needed albuterol.   Chronic kidney disease, stage 3 Discussed reasons why she needs to see nephrologist. Has not yet heard about this appointment. I will recheck her renal function today to ensure it continues to be stable. Will route message to red team regarding this referral.   Rib pain No longer a big issue. Continue to monitor.    FOLLOW UP: F/u in 2 months for routine medical issues Referring to nephrology  T[place] J. Ardelia Mems, [place]

## 2015-04-16 ENCOUNTER — Encounter: Payer: Self-pay | Admitting: Family Medicine

## 2015-04-21 ENCOUNTER — Telehealth: Payer: Self-pay | Admitting: Gastroenterology

## 2015-04-21 NOTE — Telephone Encounter (Signed)
Please call pt. She had a simple adenoma removed.  FOLLOW A High fiber diet. TCS in 3 years BECAUSE SHE HAD 4 simple adenomas removed.

## 2015-04-23 NOTE — Telephone Encounter (Signed)
Called pt and she is aware of results.

## 2015-04-23 NOTE — Telephone Encounter (Signed)
REMINDER IN EPIC °

## 2015-05-10 ENCOUNTER — Ambulatory Visit: Payer: Self-pay

## 2015-05-15 ENCOUNTER — Ambulatory Visit: Payer: Self-pay

## 2015-06-19 ENCOUNTER — Encounter: Payer: Self-pay | Admitting: Family Medicine

## 2015-06-19 ENCOUNTER — Ambulatory Visit (INDEPENDENT_AMBULATORY_CARE_PROVIDER_SITE_OTHER): Payer: Self-pay | Admitting: Family Medicine

## 2015-06-19 VITALS — BP 131/88 | HR 73 | Temp 97.9°F | Ht 60.0 in | Wt 147.0 lb

## 2015-06-19 DIAGNOSIS — R222 Localized swelling, mass and lump, trunk: Secondary | ICD-10-CM

## 2015-06-19 DIAGNOSIS — N183 Chronic kidney disease, stage 3 unspecified: Secondary | ICD-10-CM

## 2015-06-19 DIAGNOSIS — M799 Soft tissue disorder, unspecified: Secondary | ICD-10-CM

## 2015-06-19 DIAGNOSIS — R1900 Intra-abdominal and pelvic swelling, mass and lump, unspecified site: Secondary | ICD-10-CM

## 2015-06-19 DIAGNOSIS — I10 Essential (primary) hypertension: Secondary | ICD-10-CM

## 2015-06-19 DIAGNOSIS — M7989 Other specified soft tissue disorders: Secondary | ICD-10-CM

## 2015-06-19 NOTE — Progress Notes (Signed)
Patient ID: Jennifer Moss, female   DOB: 19-Nov-1951, 64 y.o.   MRN: [id number]  HPI:  Skin nodules: continues to complain of tender nodules in skin of abdominal wall. Previously we xrayed ribs, with no abnormalities. Pt is quite worried about these. Giving her pain whenever she moves in certain directions. Present for about 2 months. No fevers. Has occasional sweating at night from menopause but thinks this is actually getting better. No weight loss. Pain is distinctly in the skin of her abdominal wall, not deeper in her abdomen. stooling and urinating normally.  HTN: currently taking amlodipine, lisinopril. Tolerating these well. No CP or SOB.   CKD: seen by [facility] in May. Pt reports most recent labs were good. Appears they are planning for yearly follow up.  ROS: See HPI.  [place]: hx CKD stage 3, HLD, HTN, NF1  PHYSICAL EXAM: BP 131/88 mmHg  Pulse 73  Temp(Src) 97.9 F (36.6 C) (Oral)  Ht 5' (1.524 m)  Wt 147 lb (66.679 kg)  BMI 28.71 kg/m2 Gen: NAD HEENT: NCAT Heart: RRR no murmur Lungs: CTAB NWOB Neuro: grossly nonfocal speech normal Ext: No appreciable lower extremity edema bilaterally Abd: soft. Mildly TTP in epigastric area. Tender subcutaneous nodules present on both sides of upper abdomen inferior to ribs. Nodules are mobile. One on the left side measured appx 2cm in diameter. No skin breakdown or skin changes.  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 Patient is increasingly troubled and worried about them. I think further imaging is warranted. Will obtain CT abdomen pelvis without contrast (due to CKD no contrast). Pt to f/u in 2-3 weeks to discuss results and decide on further plan.  Hypertension Well controlled. Continue current regimen.   Chronic kidney disease, stage 3 Following now yearly with [facility]. Per patient most recent labs stable. Will continue to monitor creatinine periodically, q6 months  at least.   FOLLOW UP: F/u in 2-3 weeks to review CT results  Tanzania J. Ardelia Mems, [place]

## 2015-06-19 NOTE — Assessment & Plan Note (Signed)
Following now yearly with [facility]. Per patient most recent labs stable. Will continue to monitor creatinine periodically, q6 months at least.

## 2015-06-19 NOTE — Assessment & Plan Note (Signed)
Most likely these nodules represent neurofibromas. Patient is increasingly troubled and worried about them. I think further imaging is warranted. Will obtain CT abdomen pelvis without contrast (due to CKD no contrast). Pt to f/u in 2-3 weeks to discuss results and decide on further plan.

## 2015-06-19 NOTE — Assessment & Plan Note (Signed)
Well-controlled.  Continue current regimen. 

## 2015-06-19 NOTE — Patient Instructions (Signed)
Getting CT scan of your belly to look into these nodules Follow up with me in a few weeks to discuss the CT scan and decide on what to do next  Be well, Dr. Ardelia Mems

## 2015-06-25 ENCOUNTER — Telehealth: Payer: Self-pay | Admitting: Family Medicine

## 2015-06-25 ENCOUNTER — Ambulatory Visit (HOSPITAL_COMMUNITY)
Admission: RE | Admit: 2015-06-25 | Discharge: 2015-06-25 | Disposition: A | Discharge status: Home / Self Care | Payer: Self-pay | Source: Ambulatory Visit | Attending: Family Medicine | Admitting: Family Medicine

## 2015-06-25 DIAGNOSIS — Q632 Ectopic kidney: Secondary | ICD-10-CM | POA: Insufficient documentation

## 2015-06-25 DIAGNOSIS — R109 Unspecified abdominal pain: Secondary | ICD-10-CM | POA: Insufficient documentation

## 2015-06-25 DIAGNOSIS — R222 Localized swelling, mass and lump, trunk: Secondary | ICD-10-CM

## 2015-06-25 DIAGNOSIS — Z90710 Acquired absence of both cervix and uterus: Secondary | ICD-10-CM | POA: Insufficient documentation

## 2015-06-25 DIAGNOSIS — M7989 Other specified soft tissue disorders: Secondary | ICD-10-CM

## 2015-06-25 DIAGNOSIS — Q8500 Neurofibromatosis, unspecified: Secondary | ICD-10-CM | POA: Insufficient documentation

## 2015-06-25 DIAGNOSIS — K5730 Diverticulosis of large intestine without perforation or abscess without bleeding: Secondary | ICD-10-CM | POA: Insufficient documentation

## 2015-06-25 DIAGNOSIS — N189 Chronic kidney disease, unspecified: Secondary | ICD-10-CM | POA: Insufficient documentation

## 2015-06-25 NOTE — Telephone Encounter (Signed)
Called pt to discuss CT scan results. CT didn't show the small nodules under her skin. I looked at the images myself and also did not see them. May have been limited by being a noncontrasted study (unable to do contrast due to CKD).  Discussed option of referral to surgeon to further evaluate nodules. Pt agreeable to this plan. Will place referral.  Leeanne Rio, MD

## 2015-07-09 ENCOUNTER — Ambulatory Visit: Payer: Self-pay | Admitting: Family Medicine

## 2015-11-14 ENCOUNTER — Ambulatory Visit (INDEPENDENT_AMBULATORY_CARE_PROVIDER_SITE_OTHER): Payer: Self-pay | Admitting: Family Medicine

## 2015-11-14 ENCOUNTER — Encounter: Payer: Self-pay | Admitting: Family Medicine

## 2015-11-14 VITALS — BP 154/92 | HR 77 | Temp 98.3°F | Ht 60.0 in | Wt 146.2 lb

## 2015-11-14 DIAGNOSIS — K047 Periapical abscess without sinus: Secondary | ICD-10-CM

## 2015-11-14 MED ORDER — AMOXICILLIN-POT CLAVULANATE 875-125 MG PO TABS: 1.0000 | ORAL_TABLET | Freq: Two times a day (BID) | ORAL | Status: DC

## 2015-11-14 MED ORDER — HYDROCODONE-ACETAMINOPHEN 5-325 MG PO TABS: 1.0000 | ORAL_TABLET | Freq: Four times a day (QID) | ORAL | Status: DC | PRN

## 2015-11-14 NOTE — Patient Instructions (Signed)
Referring you to dentist Take augmentin 1 pill twice a day for 7 days Pain medicine if you need it  Someone will call you about the dentist. If you haven't heard from [place] by Friday please call our office  If unable to eat/drink, fevers, worsening swelling, etc please go to ER.  Be well, Dr. Ardelia Mems

## 2015-11-14 NOTE — Progress Notes (Signed)
Date of Visit: 11/14/2015   HPI:  Pt presents for a same day appointment to discuss dental pain/abscess.  Has had swelling in the R lower side of her mouth for 3 days.  Concerned she may have a dental abscess. Using tylenol for pain which helps, but is not able to chew any food on that side of her mouth. No fevers, drooling, or inability to eat/drink.   ROS: See HPI  [place]: history of horseshoe kidney, long QTc, neurofibromatosis tye 1, hyperlipidemia, CKD stage 3, hypertension  PHYSICAL EXAM: BP 154/92 mmHg  Pulse 77  Temp(Src) 98.3 F (36.8 C) (Oral)  Ht 5' (1.524 m)  Wt 146 lb 3 oz (66.31 kg)  BMI 28.55 kg/m2 Gen: NAD, pleasant, cooperative HEENT: swelling to R side of lower face. No erythema. Tenderness over R aspect of lower cheek. +anterior cervical lymphadenopathy, especially on R side. moist mucous membranes. Very poor dentition. Posterior R molar broken with swelling of gum noted, very tender to palpation. No pus drainage or obvious area of fluctuance on my exam today.  ASSESSMENT/PLAN:  1. Dental infection - concern for abscess. No signs of systemic illness at present, safe for outpatient management. Patient does not have dentist but does have orange card. Does not think she will be able to pay for urgent dental visit on her own. - augmentin twice daily for 7 days - norco for severe pain - urgent referral to dentist, will see if able to get her in under the orange card - return precautions discussed  FOLLOW UP: Urgent referral to dentist  Tanzania J. Ardelia Mems, [place]

## 2015-12-26 ENCOUNTER — Ambulatory Visit: Payer: Self-pay

## 2016-01-01 ENCOUNTER — Ambulatory Visit: Payer: Self-pay

## 2016-01-10 ENCOUNTER — Ambulatory Visit: Payer: Self-pay

## 2016-02-18 ENCOUNTER — Other Ambulatory Visit: Payer: Self-pay

## 2016-02-18 DIAGNOSIS — Z1231 Encounter for screening mammogram for malignant neoplasm of breast: Secondary | ICD-10-CM

## 2016-03-03 ENCOUNTER — Ambulatory Visit
Admission: RE | Admit: 2016-03-03 | Discharge: 2016-03-03 | Disposition: A | Discharge status: Home / Self Care | Payer: No Typology Code available for payment source | Source: Ambulatory Visit

## 2016-03-03 DIAGNOSIS — Z1231 Encounter for screening mammogram for malignant neoplasm of breast: Secondary | ICD-10-CM

## 2016-04-07 ENCOUNTER — Encounter: Payer: Self-pay | Admitting: Family Medicine

## 2016-04-07 ENCOUNTER — Ambulatory Visit (INDEPENDENT_AMBULATORY_CARE_PROVIDER_SITE_OTHER): Payer: No Typology Code available for payment source | Admitting: Family Medicine

## 2016-04-07 VITALS — BP 161/100 | HR 73 | Temp 97.8°F | Ht 60.0 in | Wt 139.9 lb

## 2016-04-07 DIAGNOSIS — N183 Chronic kidney disease, stage 3 unspecified: Secondary | ICD-10-CM

## 2016-04-07 DIAGNOSIS — Z1159 Encounter for screening for other viral diseases: Secondary | ICD-10-CM

## 2016-04-07 DIAGNOSIS — E785 Hyperlipidemia, unspecified: Secondary | ICD-10-CM

## 2016-04-07 DIAGNOSIS — I10 Essential (primary) hypertension: Secondary | ICD-10-CM

## 2016-04-07 DIAGNOSIS — K061 Gingival enlargement: Secondary | ICD-10-CM

## 2016-04-07 LAB — CBC WITH DIFFERENTIAL/PLATELET
BASOS ABS: 0 {cells}/uL (ref 0–200)
Basophils Relative: 0 %
EOS ABS: 304 {cells}/uL (ref 15–500)
Eosinophils Relative: 4 %
HEMATOCRIT: 44.9 % (ref 35.0–45.0)
HEMOGLOBIN: 14.6 g/dL (ref 11.7–15.5)
LYMPHS ABS: 2052 {cells}/uL (ref 850–3900)
Lymphocytes Relative: 27 %
MCH: 26.8 pg — ABNORMAL LOW (ref 27.0–33.0)
MCHC: 32.5 g/dL (ref 32.0–36.0)
MCV: 82.4 fL (ref 80.0–100.0)
MONO ABS: 380 {cells}/uL (ref 200–950)
MPV: 8.4 fL (ref 7.5–12.5)
Monocytes Relative: 5 %
NEUTROS PCT: 64 %
Neutro Abs: 4864 cells/uL (ref 1500–7800)
Platelets: 320 10*3/uL (ref 140–400)
RBC: 5.45 MIL/uL — ABNORMAL HIGH (ref 3.80–5.10)
RDW: 15.1 % — ABNORMAL HIGH (ref 11.0–15.0)
WBC: 7.6 10*3/uL (ref 3.8–10.8)

## 2016-04-07 LAB — COMPLETE METABOLIC PANEL WITH GFR
ALBUMIN: 4.3 g/dL (ref 3.6–5.1)
ALK PHOS: 87 U/L (ref 33–130)
ALT: 12 U/L (ref 6–29)
AST: 14 U/L (ref 10–35)
BUN: 19 mg/dL (ref 7–25)
CALCIUM: 9.5 mg/dL (ref 8.6–10.4)
CHLORIDE: 108 mmol/L (ref 98–110)
CO2: 24 mmol/L (ref 20–31)
Creat: 1.52 mg/dL — ABNORMAL HIGH (ref 0.50–0.99)
GFR, EST AFRICAN AMERICAN: 42 mL/min — AB (ref >=60)
GFR, EST NON AFRICAN AMERICAN: 36 mL/min — AB (ref >=60)
Glucose, Bld: 101 mg/dL — ABNORMAL HIGH (ref 65–99)
Potassium: 4.8 mmol/L (ref 3.5–5.3)
Sodium: 144 mmol/L (ref 135–146)
Total Bilirubin: 0.5 mg/dL (ref 0.2–1.2)
Total Protein: 6.6 g/dL (ref 6.1–8.1)

## 2016-04-07 LAB — LIPID PANEL
CHOL/HDL RATIO: 2.6 ratio (ref <=5.0)
CHOLESTEROL: 189 mg/dL (ref 125–200)
HDL: 72 mg/dL (ref >=46)
LDL Cholesterol: 98 mg/dL (ref <130)
TRIGLYCERIDES: 96 mg/dL (ref <150)
VLDL: 19 mg/dL (ref <30)

## 2016-04-07 LAB — HEPATITIS C ANTIBODY: HCV Ab: NEGATIVE

## 2016-04-07 MED ORDER — AMLODIPINE BESYLATE 10 MG PO TABS: 10.0000 mg | ORAL_TABLET | Freq: Every day | ORAL | Status: DC

## 2016-04-07 MED ORDER — LISINOPRIL 20 MG PO TABS: 20.0000 mg | ORAL_TABLET | Freq: Every day | ORAL | Status: DC

## 2016-04-07 NOTE — Assessment & Plan Note (Addendum)
Patient was concerned this is an abscess. She is afebrile and the growth is nontender. It appears more of a gingival polyp. I have asked her to follow up with her dentist for this. It will need likely biopsy and removal. Instructed her to call us if we need to call the dentist office to get things moving more quickly. Possible shotty mild lymphadenopathy noted today - though difficult to discern difference in patient with known neurofibromatosis type 1. Will check CBC with diff with labs.

## 2016-04-07 NOTE — Patient Instructions (Signed)
Checking labs today Refilled medicine Come back in 1-2 weeks for a nurse blood pressure check to have your blood pressure rechecked  Call the dentist about the spot in your mouth I don't think it needs antibiotics right now  Be well, Dr. Ardelia Mems

## 2016-04-07 NOTE — Progress Notes (Signed)
Date of Visit: 04/07/2016   HPI:  Hypertension - has been out of antihypertensives (lisinopril, amlodipine) for the last week.   Gum lesion - saw dentist recently and had teeth cleaned. Planning to have 7 teeth pulled soon. Has noted lesion on right lower outer gum that has grown. Nontender. No fevers. Eating and drinking well. Wonders if it is an abscess and if she needs antibiotics.   Hyperlipidemia - taking pravastatin. Due for lipids today. Takes fish oil. Denies chest pain or shortness of breath. Has worked on weight loss.   CKD - has follow up appointment next month with nephrology. Has not had labs drawn in the last 6 months.  ROS: See HPI.  [place]: history of horseshoe kidney, CKD, neurofibromatosis type 1, prior cocaine abuse, hyperlipidemia, constipation, hypertension  PHYSICAL EXAM: BP 161/100 mmHg  Pulse 73  Temp(Src) 97.8 F (36.6 C) (Oral)  Ht 5' (1.524 m)  Wt 139 lb 14.4 oz (63.458 kg)  BMI 27.32 kg/m2 Gen: NAD, pleasant, cooperative, well apeparing HEENT: normocephalic, atraumatic. Moist mucous membranes. Poor dentition with several cracked/broken teeth with obvious caries. On bottom R outer gum is a ~0.23mm gingival growth that is polyp-like, and nontender. Heart: regular rate and rhythm, no murmur Lungs: clear to auscultation bilaterally, normal work of breathing  Neuro: grossly nonfocal, speech normal Ext: No appreciable lower extremity edema bilaterally   ASSESSMENT/PLAN:  Health maintenance:  -hep C antibody drawn today with labs  Hypertension Elevated today in setting of being out of medications for a week Refills sent in Labs today - CMET, lipids Follow up in 2 weeks for RN blood pressure check   Chronic kidney disease, stage 3 Check renal function today Has follow up scheduled with renal next month Will follow up in 2 weeks for RN blood pressure check, blood pressure control will be important to preserving renal function over  time  Hyperlipidemia Update lipid panel today Will titrate statin as needed based on results  Gingival enlargement Patient was concerned this is an abscess. She is afebrile and the growth is nontender. It appears more of a gingival polyp. I have asked her to follow up with her dentist for this. It will need likely biopsy and removal. Instructed her to call us if we need to call the dentist office to get things moving more quickly. Possible shotty mild lymphadenopathy noted today - though difficult to discern difference in patient with known neurofibromatosis type 1. Will check CBC with diff with labs.    FOLLOW UP: Follow up in 2 weeks for RN blood pressure check   Tanzania J. Ardelia Mems, [place]

## 2016-04-07 NOTE — Assessment & Plan Note (Signed)
Check renal function today Has follow up scheduled with renal next month Will follow up in 2 weeks for RN blood pressure check, blood pressure control will be important to preserving renal function over time

## 2016-04-07 NOTE — Assessment & Plan Note (Signed)
Elevated today in setting of being out of medications for a week Refills sent in Labs today - CMET, lipids Follow up in 2 weeks for RN blood pressure check

## 2016-04-07 NOTE — Assessment & Plan Note (Signed)
Update lipid panel today Will titrate statin as needed based on results

## 2016-04-09 ENCOUNTER — Encounter: Payer: Self-pay | Admitting: Family Medicine

## 2016-06-02 ENCOUNTER — Other Ambulatory Visit: Payer: Self-pay | Admitting: *Deleted

## 2016-06-03 MED ORDER — PRAVASTATIN SODIUM 40 MG PO TABS: 40.0000 mg | ORAL_TABLET | Freq: Every day | ORAL | Status: DC

## 2016-08-20 ENCOUNTER — Other Ambulatory Visit: Payer: Self-pay | Admitting: Family Medicine

## 2016-08-20 DIAGNOSIS — I10 Essential (primary) hypertension: Secondary | ICD-10-CM

## 2016-08-20 NOTE — Telephone Encounter (Signed)
Needs refills on pravastatin, amlodipine and lisinopril.  Jennifer Moss

## 2016-08-21 MED ORDER — LISINOPRIL 20 MG PO TABS: 20.0000 mg | ORAL_TABLET | Freq: Every day | ORAL | 1 refills | Status: DC

## 2016-08-21 MED ORDER — AMLODIPINE BESYLATE 10 MG PO TABS: 10.0000 mg | ORAL_TABLET | Freq: Every day | ORAL | 1 refills | Status: DC

## 2016-08-21 MED ORDER — PRAVASTATIN SODIUM 40 MG PO TABS: 40.0000 mg | ORAL_TABLET | Freq: Every day | ORAL | 3 refills | Status: DC

## 2016-08-21 NOTE — Telephone Encounter (Signed)
Pt is calling for refills on her Lisinopril, Amlodipine, and pravastatin. She has been waiting several dayts. jw

## 2016-09-15 ENCOUNTER — Ambulatory Visit (INDEPENDENT_AMBULATORY_CARE_PROVIDER_SITE_OTHER): Payer: No Typology Code available for payment source | Admitting: Family Medicine

## 2016-09-15 ENCOUNTER — Encounter: Payer: Self-pay | Admitting: Family Medicine

## 2016-09-15 DIAGNOSIS — E785 Hyperlipidemia, unspecified: Secondary | ICD-10-CM

## 2016-09-15 DIAGNOSIS — I10 Essential (primary) hypertension: Secondary | ICD-10-CM

## 2016-09-15 DIAGNOSIS — K5900 Constipation, unspecified: Secondary | ICD-10-CM

## 2016-09-15 MED ORDER — LISINOPRIL 20 MG PO TABS: 20.0000 mg | ORAL_TABLET | Freq: Every day | ORAL | 1 refills | Status: DC

## 2016-09-15 MED ORDER — PRAVASTATIN SODIUM 40 MG PO TABS: 40.0000 mg | ORAL_TABLET | Freq: Every day | ORAL | 3 refills | Status: DC

## 2016-09-15 MED ORDER — LACTULOSE 10 GM/15ML PO SOLN: 10.0000 g | ORAL | 3 refills | Status: DC

## 2016-09-15 MED ORDER — AMLODIPINE BESYLATE 10 MG PO TABS: 10.0000 mg | ORAL_TABLET | Freq: Every day | ORAL | 1 refills | Status: DC

## 2016-09-15 NOTE — Progress Notes (Signed)
Date of Visit: 09/15/2016   HPI:  Patient presents for follow up and medication refill.  Hypertension - has been out of amlodipine and lisinopril for >3 weeks. Reports she's been calling us and her pharmacy to get it refilled, but the pharmacy never got the rx I faxed over via Epic back in late August. Denies having any symptoms from her elevated blood pressures. No chest pain, shortness of breath, swelling, headache, vision changes.  Has been walking and making dietary changes and generally feels much better with these changes.  Hyperlipidemia - needs pravastatin refilled, out of this one as well.  Constipation - requests rx for lactulose, a friend had this and let her try it and it worked a lot better for her than her prior regimen. Has been taking 10g/60mL lactulose, taking 2mL every other day with good results. No blood in stool.   RUQ pain - has had some discomfort beneath her R lower ribs for about a week. It is not severe, she's just noticed it. Has history of neurofibromatosis type 1. No vomiting. Eating and drinking well. stooling and urinating normally.  Also reports that since last visit, she was seen by dentist and had teeth and gums worked on, doing much better now.  ROS: See HPI.  [place]: history of hypertension, CKD3, hyperlipidemia, asthma, cocaine abuse, NF type1, diverticulosis,   PHYSICAL EXAM: BP (!) 183/114   Pulse 67   Temp 98.4 F (36.9 C) (Oral)   Ht 5' (1.524 m)   Wt 138 lb 3.2 oz (62.7 kg)   BMI 26.99 kg/m  Gen: NAD, pleasant, cooperative HEENT: normocephalic, atraumatic, moist mucous membranes  Heart: regular rate and rhythm, no murmur Lungs: clear to auscultation bilaterally, normal work of breathing  Neuro: alert, grossly nonfocal, speech normal Ext: No appreciable lower extremity edema bilaterally  Abdomen: soft. nontender to palpation throughout. No masses or organomegaly. No peritoneal signs. Normoactive bowel sounds. R lower rib mildly tender to  palpation   ASSESSMENT/PLAN:  Health maintenance:  -declined flu shot today -discussed with patient but unable to get zostavax due to financial barriers  Hypertension Again elevated due to being out of medications. I am not sure why the medications I faxed over did not go through several weeks ago. I will get in touch with the HD pharmacy to figure out the cause. In the meantime, printed rx's for her so she can take directly to the pharmacy. Follow up in 2 weeks for RN BP check and BMET to assess renal function. Stressed importance of close follow up with patient, as BP control is essential to preserving her renal function over time.  Constipation Benign abdominal exam. Reasonable to rx lactulose since it's working well for her. rx given.  Hyperlipidemia Refill pravastatin today, given printed rx.   RUQ pain Suspect this is related more to her rib, and possibly due to the NF1. Benign abdominal exam. Not severe pain. Will monitor for now.  FOLLOW UP: Follow up in 1-2 weeks for RN BP check & lab visit  Tanzania J. Ardelia Mems, [place]

## 2016-09-15 NOTE — Patient Instructions (Signed)
Printing off all your blood pressure medications. I'm sorry for the issues with faxing them over to your pharmacy.  Please schedule a nurse visit in 1-2 weeks to recheck your blood pressure. Also need to check labwork at that visit. This is very important to make sure your blood pressure is well controlled.  Giving you a prescription for lactulose for constipation.  Be well, Dr. Ardelia Mems

## 2016-09-16 NOTE — Assessment & Plan Note (Signed)
Benign abdominal exam. Reasonable to rx lactulose since it's working well for her. rx given.

## 2016-09-16 NOTE — Assessment & Plan Note (Signed)
Again elevated due to being out of medications. I am not sure why the medications I faxed over did not go through several weeks ago. I will get in touch with the HD pharmacy to figure out the cause. In the meantime, printed rx's for her so she can take directly to the pharmacy. Follow up in 2 weeks for RN BP check and BMET to assess renal function. Stressed importance of close follow up with patient, as BP control is essential to preserving her renal function over time.

## 2016-09-16 NOTE — Assessment & Plan Note (Signed)
Refill pravastatin today, given printed rx.

## 2016-09-22 ENCOUNTER — Ambulatory Visit (INDEPENDENT_AMBULATORY_CARE_PROVIDER_SITE_OTHER): Payer: No Typology Code available for payment source | Admitting: *Deleted

## 2016-09-22 ENCOUNTER — Other Ambulatory Visit: Payer: Self-pay | Admitting: Family Medicine

## 2016-09-22 VITALS — BP 150/88 | HR 80

## 2016-09-22 DIAGNOSIS — I10 Essential (primary) hypertension: Secondary | ICD-10-CM

## 2016-09-22 DIAGNOSIS — Z136 Encounter for screening for cardiovascular disorders: Secondary | ICD-10-CM

## 2016-09-22 DIAGNOSIS — Z0130 Encounter for examination of blood pressure without abnormal findings: Secondary | ICD-10-CM

## 2016-09-22 LAB — BASIC METABOLIC PANEL WITH GFR
BUN: 15 mg/dL (ref 7–25)
CO2: 23 mmol/L (ref 20–31)
Calcium: 9.3 mg/dL (ref 8.6–10.4)
Chloride: 105 mmol/L (ref 98–110)
Creat: 1.76 mg/dL — ABNORMAL HIGH (ref 0.50–0.99)
GFR, EST AFRICAN AMERICAN: 35 mL/min — AB (ref >=60)
GFR, EST NON AFRICAN AMERICAN: 31 mL/min — AB (ref >=60)
Glucose, Bld: 89 mg/dL (ref 65–99)
Potassium: 4.7 mmol/L (ref 3.5–5.3)
Sodium: 140 mmol/L (ref 135–146)

## 2016-09-22 NOTE — Progress Notes (Signed)
   Patient in nurse clinic for blood pressure check. Patient reported having a headache over the weekend.  She stated she knew she should not take Ibuprofen, but needed something to relieve the headache.  Patient stated still have headache today, but not that bad.  Patient also wanted her PCP to send in more refills for her blood pressure medications.  She only have one refill left on each medication.  Patient denies chest pain, SOB or dizziness.  Will forward to PCP.  Derl Barrow, RN   Today's Vitals   09/22/16 0901 09/22/16 0907  BP: (!) 152/90 (!) 150/88  Pulse: 66 80  SpO2: 96%   PainSc: 4    PainLoc: Head

## 2016-10-06 ENCOUNTER — Telehealth: Payer: Self-pay | Admitting: Family Medicine

## 2016-10-06 DIAGNOSIS — I10 Essential (primary) hypertension: Secondary | ICD-10-CM

## 2016-10-06 MED ORDER — LISINOPRIL 20 MG PO TABS: 20.0000 mg | ORAL_TABLET | Freq: Every day | ORAL | 5 refills | Status: DC

## 2016-10-06 MED ORDER — AMLODIPINE BESYLATE 10 MG PO TABS: 10.0000 mg | ORAL_TABLET | Freq: Every day | ORAL | 5 refills | Status: DC

## 2016-10-06 NOTE — Telephone Encounter (Signed)
Called patient to discuss labs and BP check from 2 weeks ago (I was on the inpatient service last week). Blood pressure was mildly elevated above goal when she came for recheck. Recommended increasing a medication to her, but she reports she feels much better and prefers to stay on same ones and have blood pressure rechecked again. This is very reasonable. We will have her come for another nurse blood pressure check.  Also discussed lab - renal function mildly decreased but overall in keeping with prior values. Will plan to recheck in a few months.  Patient appreciative.  Leeanne Rio, MD

## 2016-12-18 ENCOUNTER — Other Ambulatory Visit: Payer: Self-pay | Admitting: Family Medicine

## 2016-12-18 DIAGNOSIS — I10 Essential (primary) hypertension: Secondary | ICD-10-CM

## 2016-12-18 MED ORDER — LISINOPRIL 20 MG PO TABS: 20.0000 mg | ORAL_TABLET | Freq: Every day | ORAL | 0 refills | Status: DC

## 2016-12-18 MED ORDER — AMLODIPINE BESYLATE 10 MG PO TABS: 10.0000 mg | ORAL_TABLET | Freq: Every day | ORAL | 0 refills | Status: DC

## 2016-12-18 NOTE — Telephone Encounter (Signed)
Patient came by office needs refill on RX lisinopril and amlodipine. Please let patient know when filled. [phone]

## 2017-03-12 ENCOUNTER — Other Ambulatory Visit: Payer: Self-pay | Admitting: Family Medicine

## 2017-03-12 DIAGNOSIS — I10 Essential (primary) hypertension: Secondary | ICD-10-CM

## 2017-03-12 MED ORDER — LISINOPRIL 20 MG PO TABS: 20.0000 mg | ORAL_TABLET | Freq: Every day | ORAL | 0 refills | Status: DC

## 2017-03-12 MED ORDER — AMLODIPINE BESYLATE 10 MG PO TABS: 10.0000 mg | ORAL_TABLET | Freq: Every day | ORAL | 0 refills | Status: DC

## 2017-03-12 MED FILL — LISINOPRIL 20 MG TABLET: 20 | 30 days supply | Qty: 30 | Fill #0

## 2017-03-12 MED FILL — AMLODIPINE BESYLATE 10 MG T: 10 | 30 days supply | Qty: 30 | Fill #0

## 2017-03-12 NOTE — Telephone Encounter (Signed)
Pt is out of both of her BP medications. She has been out for a couple of months because she needs the orange card and she hasn't been seen. She has an upcoming appointment to see me Kennyth Lose) on the 28 th to renew. I talked with Neoma Laming our Education officer, museum and we can get her a one month supply of her medication at the outpatient pharmacy for free if you can send in refill for one month on both of her BP medications. She will also be making a appointment with her you so that she can get a check up since she will have the orange card then. Blima Rich

## 2017-03-12 NOTE — Telephone Encounter (Signed)
Will send a months worth of lisinopril and amlodipine to the outpatient pharmacy. Thank you to everyone for working with Jennifer Moss!  Leeanne Rio, MD

## 2017-03-12 NOTE — Addendum Note (Signed)
Addended by: Leeanne Rio on: 03/12/2017 10:10 AM   Modules accepted: Orders

## 2017-03-12 NOTE — Telephone Encounter (Signed)
Spoke with someone at the pharmacy, who said they would need to be resent with "[facility] indigent fund" in the comments. I have resent the prescriptions.  Thanks! Leeanne Rio, MD

## 2017-04-28 ENCOUNTER — Encounter: Payer: Self-pay | Admitting: Family Medicine

## 2017-04-28 ENCOUNTER — Ambulatory Visit (INDEPENDENT_AMBULATORY_CARE_PROVIDER_SITE_OTHER): Payer: Self-pay | Admitting: Family Medicine

## 2017-04-28 VITALS — BP 135/85 | HR 77 | Temp 98.3°F | Ht 60.0 in | Wt 141.2 lb

## 2017-04-28 DIAGNOSIS — I10 Essential (primary) hypertension: Secondary | ICD-10-CM

## 2017-04-28 DIAGNOSIS — E785 Hyperlipidemia, unspecified: Secondary | ICD-10-CM

## 2017-04-28 DIAGNOSIS — K5900 Constipation, unspecified: Secondary | ICD-10-CM

## 2017-04-28 DIAGNOSIS — N183 Chronic kidney disease, stage 3 unspecified: Secondary | ICD-10-CM

## 2017-04-28 DIAGNOSIS — R252 Cramp and spasm: Secondary | ICD-10-CM

## 2017-04-28 MED ORDER — LISINOPRIL 20 MG PO TABS: 20.0000 mg | ORAL_TABLET | Freq: Every day | ORAL | 3 refills | Status: DC

## 2017-04-28 MED ORDER — AMLODIPINE BESYLATE 10 MG PO TABS: 10.0000 mg | ORAL_TABLET | Freq: Every day | ORAL | 3 refills | Status: DC

## 2017-04-28 MED ORDER — PRAVASTATIN SODIUM 40 MG PO TABS: 40.0000 mg | ORAL_TABLET | Freq: Every day | ORAL | 3 refills | Status: DC

## 2017-04-28 MED ORDER — LACTULOSE 10 GM/15ML PO SOLN: 10.0000 g | ORAL | 3 refills | Status: DC

## 2017-04-28 NOTE — Progress Notes (Signed)
Date of Visit: 04/28/2017   HPI:  Patient presents for routine follow up.  CKD - has appointment on 5/24 with nephrologist.  Constipation - tolerating lactulose well. Having good bowel movements with it. No blood in stool.  Hypertension - currently taking amlodipine 10mg  daily and lisinopril 20mg  daily. Tolerating well. No chest pain, shortness of breath, swelling. Needs refills.  History of polysubstance abuse - prev using cocaine, denies now. Also denies tobacco use.  Leg cramps - has noticed cramps in her legs nightly for a few months, but over the last 2 weeks have occurred nightly.  ROS: See HPI.  [place]: history of NF1, hyperlipidemia, hypertension, cocaine abuse previously, CKD3  PHYSICAL EXAM: BP 135/85 (BP Location: Right Arm, Patient Position: Sitting, Cuff Size: Normal)   Pulse 77   Temp 98.3 F (36.8 C) (Oral)   Ht 5' (1.524 m)   Wt 141 lb 3.2 oz (64 kg)   SpO2 96%   BMI 27.58 kg/m  Gen: no acute distress, pleasant, cooperative HEENT: normocephalic, atraumatic, mmm Heart:  Regular rate and rhythm no murmur Lungs: clear to auscultation bilaterally normal work of breathing  Neuro: alert, grossly nonfocal, speech normal Ext: No appreciable lower extremity edema bilaterally. Calves nontender to palpation. 2+ dp pulses bilaterally  ASSESSMENT/PLAN:  Hypertension Well controlled. Continue current regimen.   Hyperlipidemia Check CMET & lipids today (she is fasting). Titrate statin as needed based on results.  Constipation Well controlled, continue as needed lactulose.  Chronic kidney disease, stage 3 Check renal function today. Has upcoming nephro appointment, encouraged to keep this appointment.   Leg cramps - etiology unclear. Will check electrolytes and CK. Exam benign. Monitor.  FOLLOW UP: Follow up in 3 mos for chronic medical issues  Tanzania J. Ardelia Mems, [place]

## 2017-04-28 NOTE — Patient Instructions (Addendum)
Sent in prescriptions to your pharmacy (90 day supply) Checking labs today: kidneys, liver, cholesterol, muscle enzyme  I'm glad you're doing so well! See me again in 3 months   Be well, Dr. Ardelia Mems

## 2017-04-29 LAB — LIPID PANEL
CHOL/HDL RATIO: 2.7 ratio (ref 0.0–4.4)
Cholesterol, Total: 200 mg/dL — ABNORMAL HIGH (ref 100–199)
HDL: 75 mg/dL (ref >39)
LDL Calculated: 111 mg/dL — ABNORMAL HIGH (ref 0–99)
Triglycerides: 72 mg/dL (ref 0–149)
VLDL Cholesterol Cal: 14 mg/dL (ref 5–40)

## 2017-04-29 LAB — CMP14+EGFR
A/G RATIO: 1.8 (ref 1.2–2.2)
ALT: 15 IU/L (ref 0–32)
AST: 24 IU/L (ref 0–40)
Albumin: 4.6 g/dL (ref 3.6–4.8)
Alkaline Phosphatase: 126 IU/L — ABNORMAL HIGH (ref 39–117)
BILIRUBIN TOTAL: 0.3 mg/dL (ref 0.0–1.2)
BUN/Creatinine Ratio: 12 (ref 12–28)
BUN: 19 mg/dL (ref 8–27)
CHLORIDE: 101 mmol/L (ref 96–106)
CO2: 17 mmol/L — ABNORMAL LOW (ref 18–29)
Calcium: 9.3 mg/dL (ref 8.7–10.3)
Creatinine, Ser: 1.56 mg/dL — ABNORMAL HIGH (ref 0.57–1.00)
GFR, EST AFRICAN AMERICAN: 40 mL/min/{1.73_m2} — AB (ref >59)
GFR, EST NON AFRICAN AMERICAN: 35 mL/min/{1.73_m2} — AB (ref >59)
Globulin, Total: 2.5 g/dL (ref 1.5–4.5)
Glucose: 86 mg/dL (ref 65–99)
Potassium: 5.1 mmol/L (ref 3.5–5.2)
Sodium: 140 mmol/L (ref 134–144)
TOTAL PROTEIN: 7.1 g/dL (ref 6.0–8.5)

## 2017-04-29 LAB — CK: Total CK: 139 U/L (ref 24–173)

## 2017-04-30 NOTE — Assessment & Plan Note (Signed)
Check CMET & lipids today (she is fasting). Titrate statin as needed based on results.

## 2017-04-30 NOTE — Assessment & Plan Note (Signed)
Well-controlled.  Continue current regimen. 

## 2017-04-30 NOTE — Assessment & Plan Note (Signed)
Well controlled, continue as needed lactulose.

## 2017-04-30 NOTE — Assessment & Plan Note (Signed)
Check renal function today. Has upcoming nephro appointment, encouraged to keep this appointment.

## 2017-05-08 ENCOUNTER — Encounter: Payer: Self-pay | Admitting: Family Medicine

## 2017-05-28 ENCOUNTER — Other Ambulatory Visit: Payer: Self-pay | Admitting: Family Medicine

## 2017-05-28 DIAGNOSIS — Z1231 Encounter for screening mammogram for malignant neoplasm of breast: Secondary | ICD-10-CM

## 2017-07-06 ENCOUNTER — Other Ambulatory Visit: Payer: Self-pay | Admitting: Obstetrics and Gynecology

## 2017-07-06 DIAGNOSIS — Z1231 Encounter for screening mammogram for malignant neoplasm of breast: Secondary | ICD-10-CM

## 2017-07-21 ENCOUNTER — Encounter (HOSPITAL_COMMUNITY): Payer: Self-pay

## 2017-07-21 ENCOUNTER — Ambulatory Visit (HOSPITAL_COMMUNITY)
Admission: RE | Admit: 2017-07-21 | Discharge: 2017-07-21 | Disposition: A | Discharge status: Home / Self Care | Payer: Self-pay | Source: Ambulatory Visit | Attending: Obstetrics and Gynecology | Admitting: Obstetrics and Gynecology

## 2017-07-21 ENCOUNTER — Ambulatory Visit
Admission: RE | Admit: 2017-07-21 | Discharge: 2017-07-21 | Disposition: A | Discharge status: Home / Self Care | Payer: No Typology Code available for payment source | Source: Ambulatory Visit | Attending: Obstetrics and Gynecology | Admitting: Obstetrics and Gynecology

## 2017-07-21 ENCOUNTER — Ambulatory Visit: Payer: No Typology Code available for payment source

## 2017-07-21 VITALS — BP 118/78 | Ht 60.0 in | Wt 141.6 lb

## 2017-07-21 DIAGNOSIS — Z1239 Encounter for other screening for malignant neoplasm of breast: Secondary | ICD-10-CM

## 2017-07-21 DIAGNOSIS — Z1231 Encounter for screening mammogram for malignant neoplasm of breast: Secondary | ICD-10-CM

## 2017-07-21 NOTE — Patient Instructions (Signed)
Explained breast self awareness with [name]. Patient did not need a Pap smear today due to last Pap smear and HPV typing was 04/04/2013. Let her know [facility] will cover Pap smears and HPV testing every 5 years unless has a history of abnormal Pap smears. Referred patient to the [facility] for a screening mammogram. Appointment scheduled for Tuesday, July 21, 2017 at 1440.  [name] Jennifer Moss verbalized understanding.  Jourdain Guay, Arvil [place], RN 2:01 PM

## 2017-07-21 NOTE — Progress Notes (Signed)
No complaints today.   Pap Smear: Pap smear not completed today. Last Pap smear was 04/04/2013 at [facility] and normal with negative HPV. Per patient has no history of an abnormal Pap smear. Last Pap smear result is in EPIC.  Physical exam: Breasts Breasts symmetrical. No skin abnormalities bilateral breasts. No nipple retraction bilateral breasts. No nipple discharge bilateral breasts. No lymphadenopathy. No lumps palpated bilateral breasts. No complaints of pain or tenderness on exam. Referred patient to the [facility] for a screening mammogram. Appointment scheduled for Tuesday, July 21, 2017 at 1440.        Pelvic/Bimanual No Pap smear completed today since last Pap smear and HPV typing was 04/04/2013. Pap smear not indicated per BCCCP guidelines.   Smoking History: Patient has never smoked.  Patient Navigation: Patient education provided. Access to services provided for patient through [facility] program.   Colorectal Cancer Screening: Per patient had a colonoscopy completed in 2016. No complaints today. FIT Test given to patient to complete and return to BCCCP.

## 2017-07-22 ENCOUNTER — Encounter (HOSPITAL_COMMUNITY): Payer: Self-pay | Admitting: *Deleted

## 2017-08-03 ENCOUNTER — Other Ambulatory Visit: Payer: Self-pay | Admitting: Obstetrics and Gynecology

## 2017-08-05 ENCOUNTER — Encounter (HOSPITAL_COMMUNITY): Payer: Self-pay

## 2017-08-10 ENCOUNTER — Encounter (HOSPITAL_COMMUNITY): Payer: Self-pay

## 2017-08-11 LAB — FECAL OCCULT BLOOD, IMMUNOCHEMICAL: FECAL OCCULT BLD: NEGATIVE

## 2017-08-28 ENCOUNTER — Encounter: Payer: Self-pay | Admitting: Family Medicine

## 2017-08-28 ENCOUNTER — Ambulatory Visit (INDEPENDENT_AMBULATORY_CARE_PROVIDER_SITE_OTHER): Payer: Self-pay | Admitting: Family Medicine

## 2017-08-28 VITALS — BP 122/90 | HR 74 | Temp 98.3°F | Ht 60.0 in | Wt 140.0 lb

## 2017-08-28 DIAGNOSIS — L680 Hirsutism: Secondary | ICD-10-CM

## 2017-08-28 DIAGNOSIS — R195 Other fecal abnormalities: Secondary | ICD-10-CM

## 2017-08-28 DIAGNOSIS — L678 Other hair color and hair shaft abnormalities: Secondary | ICD-10-CM

## 2017-08-28 DIAGNOSIS — E785 Hyperlipidemia, unspecified: Secondary | ICD-10-CM

## 2017-08-28 DIAGNOSIS — R739 Hyperglycemia, unspecified: Secondary | ICD-10-CM

## 2017-08-28 DIAGNOSIS — K5900 Constipation, unspecified: Secondary | ICD-10-CM

## 2017-08-28 DIAGNOSIS — I10 Essential (primary) hypertension: Secondary | ICD-10-CM

## 2017-08-28 LAB — POCT GLYCOSYLATED HEMOGLOBIN (HGB A1C): Hemoglobin A1C: 5.6

## 2017-08-28 MED ORDER — LACTULOSE 10 GM/15ML PO SOLN: 10.0000 g | ORAL | 3 refills | Status: DC

## 2017-08-28 MED ORDER — LISINOPRIL 20 MG PO TABS: 20.0000 mg | ORAL_TABLET | Freq: Every day | ORAL | 3 refills | Status: DC

## 2017-08-28 MED ORDER — AMLODIPINE BESYLATE 10 MG PO TABS: 10.0000 mg | ORAL_TABLET | Freq: Every day | ORAL | 3 refills | Status: DC

## 2017-08-28 MED ORDER — LOVASTATIN 40 MG PO TABS: 40.0000 mg | ORAL_TABLET | Freq: Every day | ORAL | 3 refills | Status: DC

## 2017-08-28 NOTE — Assessment & Plan Note (Signed)
Well-controlled, continue current medications. Recheck renal function in 3 months given ckd3

## 2017-08-28 NOTE — Patient Instructions (Addendum)
Checking hemoglobin today  If dark stools return please call me so we can get you in to see a stomach doctor  Checking labs today due to facial hair  Blood pressure looks good  Refilled medications  Call with any questions or concerns.  Be well, Dr. Ardelia Mems

## 2017-08-28 NOTE — Assessment & Plan Note (Signed)
Patient believes pravastatin caused her to have dark stools. I will switch her to lovastatin as she did seem to benefit from a moderate intensity statin due to her ASCVD risk score. Check CBC today to ensure no anemia. Counseled to return if dark stools return.

## 2017-08-28 NOTE — Assessment & Plan Note (Signed)
Suspect this is in the range of normal for a postmenopausal female. She does have quite a few hairs on her chin, so we will do a lab workup to rule out hyperandrogenism. Check total testosterone, DHEAS, prolactin, 17-hydroxyprogesterone. Patient agreeable with this plan.

## 2017-08-28 NOTE — Assessment & Plan Note (Signed)
Well-controlled on lactulose. Continue this medication.

## 2017-08-28 NOTE — Progress Notes (Signed)
Date of Visit: 08/28/2017   HPI:  Patient presents for follow-up and medication refills.  Hypertension: Taking amlodipine 10 mg daily, lisinopril 20 mg daily. Tolerating these medications well. Denies any swelling, chest pain, or shortness of breath.  Hyperlipidemia: Was taking pravastatin daily but about a month ago noticed that her stools were dark, and she thought this was due to the pravastatin. She stopped it and then 1-1/2 weeks later she had her stools returned to normal. Did not see any blood in her stool. Says they were normal consistency, not sticky or tarry. They were just very dark/black. Denies any epigastric pain. She is up-to-date on colonoscopy, had simple adenomas resected in April 2016. Was instructed to follow-up in 5 years for next colonoscopy. She also recently had fecal immunochemical testing done 3 weeks ago which was negative.  Prediabetes: Due for A1c rechecked today. It was 5.6. She is pleased to hear this.  Facial hair: Noticed that she has a lot of hair on her chin and upper lip. She waxes these and shaves, but is troubled by it. Denies having any abnormal hair on her chest, abdomen, buttocks, back, legs, or arms. First noticed hair growth about 10 years ago, and reports it is getting worse.  Constipation: Doing very well with lactulose. Takes it every other day.  ROS: See HPI.  [place]: History of hypertension, hyperlipidemia, CKD3, asthma, constipation, diverticulosis, horse shoe kidney Does not smoke cigarettes. Reports she is abstinent from all forms of drugs and nicotine.  PHYSICAL EXAM: BP 122/90   Pulse 74   Temp 98.3 F (36.8 C) (Oral)   Ht 5' (1.524 m)   Wt 140 lb (63.5 kg)   BMI 27.34 kg/m  Gen: no acute distress, pleasant, cooperative, well appearing. Normal female vocal quality, no deepening of the voice. HEENT: normocephalic, atraumatic, moist mucous membranes  Heart: regular rate and rhythm, no murmur Lungs: clear to auscultation bilaterally  normal work of breathing  Neuro: alert, grossly nonfocal speech normal Ext: No appreciable lower extremity edema bilaterally  Skin: several coarse hairs on chin, none visible on upper lip (recently had waxed)  ASSESSMENT/PLAN:  Health maintenance:  -History of prediabetes noted, A1c repeated today and is normal at 5.6 -Offered flu shot patient but she declined. -Otherwise up-to-date on health maintenance items  Hypertension Well-controlled, continue current medications. Recheck renal function in 3 months given ckd3  Hyperlipidemia Patient believes pravastatin caused her to have dark stools. I will switch her to lovastatin as she did seem to benefit from a moderate intensity statin due to her ASCVD risk score. Check CBC today to ensure no anemia. Counseled to return if dark stools return.  Hirsutism Suspect this is in the range of normal for a postmenopausal female. She does have quite a few hairs on her chin, so we will do a lab workup to rule out hyperandrogenism. Check total testosterone, DHEAS, prolactin, 17-hydroxyprogesterone. Patient agreeable with this plan.  Constipation Well-controlled on lactulose. Continue this medication.  Dark stools Patient believes this is due to pravastatin. I will check her hemoglobin today to ensure it is stable. Of note she is up-to-date on colonoscopy, and even recently had a normal fecal immunochemical test. She was counseled to let me know if this returns, at which point I would refer her to a gastroenterologist.  FOLLOW UP: Follow up in 3 months for chronic medical issues.  [place]. Ardelia Mems, [place]

## 2017-09-06 LAB — CBC
HEMATOCRIT: 48.6 % — AB (ref 34.0–46.6)
HEMOGLOBIN: 15.6 g/dL (ref 11.1–15.9)
MCH: 27.7 pg (ref 26.6–33.0)
MCHC: 32.1 g/dL (ref 31.5–35.7)
MCV: 86 fL (ref 79–97)
Platelets: 321 10*3/uL (ref 150–379)
RBC: 5.64 x10E6/uL — ABNORMAL HIGH (ref 3.77–5.28)
RDW: 14.9 % (ref 12.3–15.4)
WBC: 8.6 10*3/uL (ref 3.4–10.8)

## 2017-09-06 LAB — TESTOSTERONE, TOTAL, LC/MS/MS: Testosterone, total: 36.9 ng/dL (ref 7.0–40.0)

## 2017-09-06 LAB — 17-HYDROXYPROGESTERONE: 17 HYDROXYPROGESTERONE: 13 ng/dL

## 2017-09-06 LAB — DHEA-SULFATE: DHEA SO4: 122.2 ug/dL (ref 29.4–220.5)

## 2017-09-06 LAB — PROLACTIN: PROLACTIN: 5.6 ng/mL (ref 4.8–23.3)

## 2017-09-07 ENCOUNTER — Encounter: Payer: Self-pay | Admitting: Family Medicine

## 2017-09-16 ENCOUNTER — Ambulatory Visit: Payer: Self-pay

## 2018-02-23 ENCOUNTER — Encounter: Payer: Self-pay | Admitting: Gastroenterology

## 2018-03-17 ENCOUNTER — Ambulatory Visit: Payer: Self-pay

## 2018-07-09 ENCOUNTER — Other Ambulatory Visit: Payer: Self-pay

## 2018-07-09 ENCOUNTER — Ambulatory Visit (INDEPENDENT_AMBULATORY_CARE_PROVIDER_SITE_OTHER): Payer: Self-pay | Admitting: Family Medicine

## 2018-07-09 ENCOUNTER — Encounter: Payer: Self-pay | Admitting: Family Medicine

## 2018-07-09 VITALS — BP 108/80 | HR 70 | Temp 98.1°F | Ht 60.0 in | Wt 135.8 lb

## 2018-07-09 DIAGNOSIS — R519 Headache, unspecified: Secondary | ICD-10-CM

## 2018-07-09 DIAGNOSIS — E785 Hyperlipidemia, unspecified: Secondary | ICD-10-CM

## 2018-07-09 DIAGNOSIS — I10 Essential (primary) hypertension: Secondary | ICD-10-CM

## 2018-07-09 DIAGNOSIS — R51 Headache: Secondary | ICD-10-CM

## 2018-07-09 DIAGNOSIS — K5900 Constipation, unspecified: Secondary | ICD-10-CM

## 2018-07-09 MED ORDER — LISINOPRIL 20 MG PO TABS: 20.0000 mg | ORAL_TABLET | Freq: Every day | ORAL | 3 refills | Status: DC

## 2018-07-09 MED ORDER — AMLODIPINE BESYLATE 10 MG PO TABS: 10.0000 mg | ORAL_TABLET | Freq: Every day | ORAL | 3 refills | Status: DC

## 2018-07-09 MED ORDER — LACTULOSE 10 GM/15ML PO SOLN: 10.0000 g | ORAL | 3 refills | Status: DC

## 2018-07-09 MED ORDER — LOVASTATIN 40 MG PO TABS: 40.0000 mg | ORAL_TABLET | Freq: Every day | ORAL | 3 refills | Status: DC

## 2018-07-09 NOTE — Progress Notes (Signed)
Date of Visit: 07/09/2018   HPI:  Patient presents for routine follow up and medication refill.  Hypertension - taking amlodipine 10mg  daily, lisinopril 20mg  daily. Tolerating these well. No chest pain or shortness of breath.  Hyperlipidemia - taking lovastatin 40mg  daily, tolerating well, due for lipids but not fasting today  Constipation - takes lactulose 10g every other day with good results, bowels moving regularly. No blood in stool.  Sharp pains in head-  Had this when she was a child and saw a neurologist and never found anything wrong but never had imaging. Has had them as an adult once in a while, about once every 6-7 months. In April had 2 back to back, which scared her. Burning sensation that starts on top of left side of head and goes down side of head into neck/shoulder. Lasts 10-15 seconds at a time. The next day was messing up words with speech. None since April. No pain in temples. No jaw claudication. No vision changes.  ROS: See HPI.  [place]: history of hypertension, hyperlipidemia, CKD3, asthma, NF type 1, diverticulosis, horseshoe kidney  PHYSICAL EXAM: BP 108/80   Pulse 70   Temp 98.1 F (36.7 C) (Oral)   Ht 5' (1.524 m)   Wt 135 lb 12.8 oz (61.6 kg)   SpO2 98%   BMI 26.52 kg/m  Gen: no acute distress, pleasant, cooperative, well appearing HEENT: normocephalic, atraumatic  Heart: regular rate and rhythm, no murmur Lungs: clear to auscultation bilaterally, normal work of breathing  Neuro: cranial nerves II-XII tested and intact. Speech normal. Full strength bilat upper and lower ext. Normal FNF.  Ext: No appreciable lower extremity edema bilaterally   ASSESSMENT/PLAN:  Health maintenance:  -pap smear - discussed with patient, she would prefer to go ahead and stop pap smears at this time. She turns 59 in November, had a painful experience with her last pap, which was normal, and has had adequate screening with normal paps in the last 10 years. Will declare her  done with cervical cancer screening at this time.  Hypertension Well controlled. Continue current regimen.  Hyperlipidemia Return for lipids next week as patient nonfasting today  Headache Unclear cause of headache syndrome. Having headache only once every few months, lasting 15ish seconds. Normal neuro exam today. The concerning feature with her last headache was that she had speech difficulties the next day. Patient presently has no insurance and would like to wait until she gets Medicare in November to pursue any workup. Will plan to likely start with MRI once she is ready. I will go ahead and check sed rate next week with her labs to rule out temporal arteritis, though lower suspicion based on history. In the meantime, gave return precautions, specifically to go to ED if she has any recurrent neurological deficits.  Constipation Well controlled on lactulose, continue current regimen.  FOLLOW UP: Follow up in December for above issues  [place]. Ardelia Mems, [place]

## 2018-07-09 NOTE — Patient Instructions (Signed)
It was great to see you again today!  Refilled medications On your way out, schedule an appointment one morning to come back for fasting labs. Do not eat or drink anything other than water the morning of your lab appointment until after your labs are drawn.  Also checking a lab for your headache  Follow up when you get Medicare so we can discuss MRI of your head, return sooner if headaches return or worsen.  Be well, Dr. Ardelia Mems

## 2018-07-12 ENCOUNTER — Other Ambulatory Visit: Payer: Self-pay

## 2018-07-12 DIAGNOSIS — R51 Headache: Secondary | ICD-10-CM

## 2018-07-12 DIAGNOSIS — E785 Hyperlipidemia, unspecified: Secondary | ICD-10-CM

## 2018-07-12 DIAGNOSIS — R519 Headache, unspecified: Secondary | ICD-10-CM | POA: Insufficient documentation

## 2018-07-12 NOTE — Assessment & Plan Note (Signed)
Well controlled on lactulose, continue current regimen.

## 2018-07-12 NOTE — Assessment & Plan Note (Signed)
Well-controlled.  Continue current regimen. 

## 2018-07-12 NOTE — Assessment & Plan Note (Signed)
Unclear cause of headache syndrome. Having headache only once every few months, lasting 15ish seconds. Normal neuro exam today. The concerning feature with her last headache was that she had speech difficulties the next day. Patient presently has no insurance and would like to wait until she gets Medicare in November to pursue any workup. Will plan to likely start with MRI once she is ready. I will go ahead and check sed rate next week with her labs to rule out temporal arteritis, though lower suspicion based on history. In the meantime, gave return precautions, specifically to go to ED if she has any recurrent neurological deficits.

## 2018-07-12 NOTE — Assessment & Plan Note (Signed)
Return for lipids next week as patient nonfasting today

## 2018-07-13 LAB — LIPID PANEL
CHOL/HDL RATIO: 2.5 ratio (ref 0.0–4.4)
CHOLESTEROL TOTAL: 200 mg/dL — AB (ref 100–199)
HDL: 79 mg/dL (ref >39)
LDL CALC: 98 mg/dL (ref 0–99)
TRIGLYCERIDES: 116 mg/dL (ref 0–149)
VLDL Cholesterol Cal: 23 mg/dL (ref 5–40)

## 2018-07-13 LAB — SEDIMENTATION RATE: Sed Rate: 19 mm/hr (ref 0–40)

## 2018-07-23 ENCOUNTER — Encounter: Payer: Self-pay | Admitting: Family Medicine

## 2018-09-15 ENCOUNTER — Ambulatory Visit: Payer: Self-pay

## 2018-12-16 ENCOUNTER — Ambulatory Visit (INDEPENDENT_AMBULATORY_CARE_PROVIDER_SITE_OTHER): Payer: Medicare Other | Admitting: Family Medicine

## 2018-12-16 ENCOUNTER — Other Ambulatory Visit: Payer: Self-pay | Admitting: Family Medicine

## 2018-12-16 ENCOUNTER — Ambulatory Visit (HOSPITAL_COMMUNITY)
Admission: RE | Admit: 2018-12-16 | Discharge: 2018-12-16 | Disposition: A | Discharge status: Home / Self Care | Payer: Medicare Other | Source: Ambulatory Visit | Attending: Family Medicine | Admitting: Family Medicine

## 2018-12-16 VITALS — BP 125/80 | HR 71 | Temp 98.1°F | Wt 134.8 lb

## 2018-12-16 DIAGNOSIS — N183 Chronic kidney disease, stage 3 unspecified: Secondary | ICD-10-CM

## 2018-12-16 DIAGNOSIS — I10 Essential (primary) hypertension: Secondary | ICD-10-CM | POA: Diagnosis not present

## 2018-12-16 DIAGNOSIS — I129 Hypertensive chronic kidney disease with stage 1 through stage 4 chronic kidney disease, or unspecified chronic kidney disease: Secondary | ICD-10-CM | POA: Insufficient documentation

## 2018-12-16 DIAGNOSIS — Z1231 Encounter for screening mammogram for malignant neoplasm of breast: Secondary | ICD-10-CM

## 2018-12-16 DIAGNOSIS — R079 Chest pain, unspecified: Secondary | ICD-10-CM | POA: Diagnosis not present

## 2018-12-16 DIAGNOSIS — R51 Headache: Secondary | ICD-10-CM | POA: Diagnosis not present

## 2018-12-16 DIAGNOSIS — R519 Headache, unspecified: Secondary | ICD-10-CM

## 2018-12-16 DIAGNOSIS — E2839 Other primary ovarian failure: Secondary | ICD-10-CM | POA: Diagnosis not present

## 2018-12-16 DIAGNOSIS — Z0000 Encounter for general adult medical examination without abnormal findings: Secondary | ICD-10-CM | POA: Diagnosis not present

## 2018-12-16 LAB — TROPONIN I: Troponin I: <0.01 ng/mL (ref 0.00–0.04)

## 2018-12-16 NOTE — Progress Notes (Signed)
Date of Visit: 12/16/2018   HPI:  Patient presents for routine follow up.  Chest pain - started the other day. Dull pain. Coming every 1-2 hours. Lasts just a second. Also notices a soreness in left arm at the same time. No sweating or nausea associated. No shortness of breath with it. No aggravating factors. Comes on unrelated to activity. Chest is not sore to the touch when it happens. Has not taken any medications for it. No smoking or cocaine use. Fhx of heart issues in grandmother (had 2 strokes & 2 MI's, is 38 years old).  Sharp pain in head - still getting these, once every 3 months or so. Sharp burning pain, lasts 4-5 or so seconds. Notices she's foggy headed for a few days after them now. No recent speech changes afterwards. We discussed this at her last visit and patient wanted to wait until now to get head imaging to evaluate it.  Hypertension - currenltly taking amlodipine 10mg  daily, lisinopril 20mg  daily. Tolerating these both.  ROS: See HPI.   [place]: history of hypertension, ckd3, NF1, asthma, hyperlipidemia  Family hx of osteoporosis in aunt.  PHYSICAL EXAM: BP 125/80 (BP Location: Right Arm, Patient Position: Sitting, Cuff Size: Normal)   Pulse 71   Temp 98.1 F (36.7 C) (Oral)   Wt 134 lb 12.8 oz (61.1 kg)   SpO2 98%   BMI 26.33 kg/m  Gen: no acute distress, pleasant, cooperative HEENT: normocephalic, atraumatic, moist mucous membranes  Heart: regular rate and rhythm, no murmur Lungs: clear to auscultation bilaterally, normal work of breathing  Neuro: alert, grossly nonfocal, speech normal. 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 Face symmetric, strength 5/5 in upper & lower extremities. Tongue protrudes midline. Normal FNF bilaterally Ext: No appreciable lower extremity edema bilaterally   ASSESSMENT/PLAN:  Health maintenance:  -DEXA scan & mammogram scheduled today -had planned to give PCV13 today but did not administer before patient left,  will address at future visit  Chest pain New onset chest pain several days ago. Has both typical and atypical features (not exertional, but associated with symptoms in L arm, not reproducible on palpation). Patient has history of neg stress test 5 years ago but no recent ischemic evaluation. EKG performed today and on my read is unchanged from prior (though cardiology read suggests possible new poor R wave progression). Discussed options of evaluation with patient, including visit to ED for ACS rule out, possible inpatient valuation. She strongly desires to avoid ED or inpatient visit. Agreeable to outpatient referral to cardiology. Will check stat troponin to ensure no current ischemic damage, if positive will call patient & have her go to ED. Otherwise refer to cardiology. Return precautions discussed extensively with patient.  Chronic kidney disease, stage 3 Check CMET today to monitor renal function.   Headache Continued episodic headaches with some ? Neurological symptoms afterward (foggy headed for a few days). Worth evaluating with MRI given age and history of NF1. MRI with and w/o contrast ordered & scheduled.  Hypertension Well controlled. Continue current regimen.   FOLLOW UP: Follow up in 3 mos for routine medical problems Referring to cardiology  [place] J. Ardelia Mems, [place]

## 2018-12-16 NOTE — Assessment & Plan Note (Signed)
Continued episodic headaches with some ? Neurological symptoms afterward (foggy headed for a few days). Worth evaluating with MRI given age and history of NF1. MRI with and w/o contrast ordered & scheduled.

## 2018-12-16 NOTE — Assessment & Plan Note (Signed)
Well-controlled.  Continue current regimen. 

## 2018-12-16 NOTE — Assessment & Plan Note (Signed)
New onset chest pain several days ago. Has both typical and atypical features (not exertional, but associated with symptoms in L arm, not reproducible on palpation). Patient has history of neg stress test 5 years ago but no recent ischemic evaluation. EKG performed today and on my read is unchanged from prior (though cardiology read suggests possible new poor R wave progression). Discussed options of evaluation with patient, including visit to ED for ACS rule out, possible inpatient valuation. She strongly desires to avoid ED or inpatient visit. Agreeable to outpatient referral to cardiology. Will check stat troponin to ensure no current ischemic damage, if positive will call patient & have her go to ED. Otherwise refer to cardiology. Return precautions discussed extensively with patient.

## 2018-12-16 NOTE — Assessment & Plan Note (Signed)
Check CMET today to monitor renal function.

## 2018-12-16 NOTE — Patient Instructions (Signed)
Scheduling bone density test  Pneumonia shot today  Getting MRI  If you have any chest pain that does not go away within 30 minutes, is accompanied by nausea, sweating, shortness of breath, or made worse by activity, go to the emergency room immediately for evaluation.   Referring to heart doctor  Follow up with me in 3 months, sooner if needed.  Be well, Dr. Ardelia Mems

## 2018-12-17 ENCOUNTER — Encounter: Payer: Self-pay | Admitting: Family Medicine

## 2018-12-17 LAB — CMP14+EGFR
ALT: 19 IU/L (ref 0–32)
AST: 18 IU/L (ref 0–40)
Albumin/Globulin Ratio: 2.4 — ABNORMAL HIGH (ref 1.2–2.2)
Albumin: 4.8 g/dL (ref 3.6–4.8)
Alkaline Phosphatase: 102 IU/L (ref 39–117)
BUN/Creatinine Ratio: 9 — ABNORMAL LOW (ref 12–28)
BUN: 13 mg/dL (ref 8–27)
Bilirubin Total: 0.5 mg/dL (ref 0.0–1.2)
CO2: 22 mmol/L (ref 20–29)
Calcium: 9.8 mg/dL (ref 8.7–10.3)
Chloride: 105 mmol/L (ref 96–106)
Creatinine, Ser: 1.5 mg/dL — ABNORMAL HIGH (ref 0.57–1.00)
GFR calc Af Amer: 42 mL/min/{1.73_m2} — ABNORMAL LOW (ref >59)
GFR calc non Af Amer: 36 mL/min/{1.73_m2} — ABNORMAL LOW (ref >59)
Globulin, Total: 2 g/dL (ref 1.5–4.5)
Glucose: 92 mg/dL (ref 65–99)
Potassium: 4.4 mmol/L (ref 3.5–5.2)
Sodium: 143 mmol/L (ref 134–144)
Total Protein: 6.8 g/dL (ref 6.0–8.5)

## 2018-12-28 ENCOUNTER — Ambulatory Visit
Admission: RE | Admit: 2018-12-28 | Discharge: 2018-12-28 | Disposition: A | Discharge status: Home / Self Care | Payer: Medicare Other | Source: Ambulatory Visit | Attending: Family Medicine | Admitting: Family Medicine

## 2018-12-28 DIAGNOSIS — R519 Headache, unspecified: Secondary | ICD-10-CM

## 2018-12-28 DIAGNOSIS — R51 Headache: Secondary | ICD-10-CM | POA: Diagnosis not present

## 2018-12-28 MED ORDER — GADOBENATE DIMEGLUMINE 529 MG/ML IV SOLN
6.0000 mL | Freq: Once | INTRAVENOUS | Status: AC | PRN
  Administered 2018-12-28: 6 mL via INTRAVENOUS

## 2019-01-07 ENCOUNTER — Encounter: Payer: Self-pay | Admitting: Family Medicine

## 2019-01-21 DIAGNOSIS — H25813 Combined forms of age-related cataract, bilateral: Secondary | ICD-10-CM | POA: Diagnosis not present

## 2019-02-09 ENCOUNTER — Ambulatory Visit: Payer: Medicare Other

## 2019-02-09 ENCOUNTER — Other Ambulatory Visit: Payer: Medicare Other

## 2019-02-28 ENCOUNTER — Other Ambulatory Visit: Payer: Self-pay

## 2019-02-28 DIAGNOSIS — I10 Essential (primary) hypertension: Secondary | ICD-10-CM

## 2019-02-28 NOTE — Telephone Encounter (Signed)
Red team please ask patient if she was ever seen by cardiology. I referred her at her last visit but don't see any notes from them. Thanks Leeanne Rio, MD

## 2019-03-01 DIAGNOSIS — H4423 Degenerative myopia, bilateral: Secondary | ICD-10-CM | POA: Diagnosis not present

## 2019-03-01 DIAGNOSIS — H2513 Age-related nuclear cataract, bilateral: Secondary | ICD-10-CM | POA: Diagnosis not present

## 2019-03-01 DIAGNOSIS — H338 Other retinal detachments: Secondary | ICD-10-CM | POA: Diagnosis not present

## 2019-03-01 MED ORDER — LISINOPRIL 20 MG PO TABS: 20.0000 mg | ORAL_TABLET | Freq: Every day | ORAL | 3 refills | Status: DC

## 2019-03-01 NOTE — Telephone Encounter (Signed)
Called patient inquiring of cardiology visit per Dr. Ardelia Mems.    Patient states that someone called her about appointment a few weeks ago.She was asked  if she had a pacemaker.   Patient states that she told them" no and that they needed to figure out what she needed and call her back with an appointment."   Patient states that no one has ever called her back.  Patient also wants to inform Dr. Ardelia Mems that she is getting cataract surgery and that she has to reschedule her Bone Density and Mammogram.  .Ozella Almond, CMA

## 2019-03-01 NOTE — Telephone Encounter (Signed)
Ok. I have messaged Kennyth Lose to follow up on the status of this referral. Leeanne Rio, MD

## 2019-04-21 ENCOUNTER — Other Ambulatory Visit: Payer: Self-pay | Admitting: *Deleted

## 2019-04-21 MED ORDER — AMLODIPINE BESYLATE 10 MG PO TABS: 10.0000 mg | ORAL_TABLET | Freq: Every day | ORAL | 0 refills | Status: DC

## 2019-04-27 ENCOUNTER — Other Ambulatory Visit: Payer: Self-pay | Admitting: *Deleted

## 2019-04-27 DIAGNOSIS — I10 Essential (primary) hypertension: Secondary | ICD-10-CM

## 2019-04-27 MED ORDER — LISINOPRIL 20 MG PO TABS: 20.0000 mg | ORAL_TABLET | Freq: Every day | ORAL | 0 refills | Status: DC

## 2019-04-27 NOTE — Telephone Encounter (Signed)
Error

## 2019-05-11 ENCOUNTER — Other Ambulatory Visit: Payer: Self-pay

## 2019-05-11 ENCOUNTER — Telehealth (INDEPENDENT_AMBULATORY_CARE_PROVIDER_SITE_OTHER): Payer: Medicare Other | Admitting: Family Medicine

## 2019-05-11 DIAGNOSIS — R079 Chest pain, unspecified: Secondary | ICD-10-CM

## 2019-05-11 DIAGNOSIS — I129 Hypertensive chronic kidney disease with stage 1 through stage 4 chronic kidney disease, or unspecified chronic kidney disease: Secondary | ICD-10-CM

## 2019-05-11 DIAGNOSIS — E783 Hyperchylomicronemia: Secondary | ICD-10-CM | POA: Diagnosis not present

## 2019-05-11 DIAGNOSIS — T50905A Adverse effect of unspecified drugs, medicaments and biological substances, initial encounter: Secondary | ICD-10-CM

## 2019-05-11 DIAGNOSIS — E785 Hyperlipidemia, unspecified: Secondary | ICD-10-CM

## 2019-05-11 DIAGNOSIS — N183 Chronic kidney disease, stage 3 unspecified: Secondary | ICD-10-CM

## 2019-05-11 DIAGNOSIS — I10 Essential (primary) hypertension: Secondary | ICD-10-CM

## 2019-05-11 NOTE — Assessment & Plan Note (Signed)
Compliant with medications. Patient to get blood pressure monitor at home. Discussed goal blood pressure of 140/90, and to call us if #s get above that.

## 2019-05-11 NOTE — Assessment & Plan Note (Signed)
Has upcoming follow up with renal

## 2019-05-11 NOTE — Assessment & Plan Note (Signed)
Now not able to tolerate lovastatin (prev did not tolerate pravastatin). Will hold statin for now, consider low dose restart in future (would try atorv or rosuvastatin).

## 2019-05-11 NOTE — Progress Notes (Signed)
[place] Telemedicine Visit  Patient consented to have virtual visit. Method of visit: Telephone  Encounter participants: Patient: Jennifer Moss - located at home Provider: Chrisandra Netters - located at office Others (if applicable): n/a  Chief Complaint: follow up  HPI:  Cardiology referral - I previously referred her to cardiology because she was having chest pain. She declined to schedule the appointment when they contacted her about it. is having cataract surgery later this month and wants to wait until after that procedure to see the cardiologist. Now denies having chest pain with exertion, occasionally has some dull aching not exertional. Occasional dyspnea on exertion but "not too much" per patient. Notes she's gained weight and recently started back walking, but is able to do this well.  Hyperlipidemia -Was taking lovastatin, started in January. She noted feeling like she was choking when laying down and attributed it to starting lovastatin. The symptom went away when she stopped lovastatin. Previously also did not tolerate pravastatin.  Hypertension - currently taking amlodipine 10mg  daily and lisinopril 20mg  daily. Does not check blood pressure at home but plans to get blood pressure monitor through her insurance.  Has appointment with renal 5/27  ROS: per HPI  Pertinent PMHx: history of NF1, horseshoe kidney, hypertension, ckd3  Exam:  Respiratory: Patient speaking normally in full sentences throughout the phone encounter, without any respiratory distress evident.   Assessment/Plan:  Hypertension Compliant with medications. Patient to get blood pressure monitor at home. Discussed goal blood pressure of 140/90, and to call us if #s get above that.  Chronic kidney disease, stage 3 (HCC) Has upcoming follow up with renal  Chest pain Previously referred to cardiology, but patient canceled the referral. Now wants to wait until after cataract  surgery. As she has no significant complaints of chest pain or shortness of breath now, I think this is fine to wait. She will contact us if has new or worsening chest pain/sob.   Hyperlipidemia Now not able to tolerate lovastatin (prev did not tolerate pravastatin). Will hold statin for now, consider low dose restart in future (would try atorv or rosuvastatin).   Healthcare maintenance Reminded patient of needing pneumonia shot when I see her next  Follow up with me in 3 months.   Time spent during visit with patient: 13 minutes

## 2019-05-11 NOTE — Assessment & Plan Note (Addendum)
Previously referred to cardiology, but patient canceled the referral. Now wants to wait until after cataract surgery. As she has no significant complaints of chest pain or shortness of breath now, I think this is fine to wait. She will contact us if has new or worsening chest pain/sob.

## 2019-05-19 DIAGNOSIS — H2512 Age-related nuclear cataract, left eye: Secondary | ICD-10-CM | POA: Diagnosis not present

## 2019-06-22 ENCOUNTER — Other Ambulatory Visit: Payer: Self-pay | Admitting: Family Medicine

## 2019-06-28 ENCOUNTER — Other Ambulatory Visit: Payer: Self-pay | Admitting: Family Medicine

## 2019-06-28 DIAGNOSIS — I10 Essential (primary) hypertension: Secondary | ICD-10-CM

## 2019-08-08 ENCOUNTER — Other Ambulatory Visit: Payer: Self-pay

## 2019-08-08 ENCOUNTER — Ambulatory Visit (INDEPENDENT_AMBULATORY_CARE_PROVIDER_SITE_OTHER): Payer: Medicare Other

## 2019-08-08 VITALS — BP 105/75 | HR 100 | Temp 98.1°F | Ht 62.0 in | Wt 145.0 lb

## 2019-08-08 DIAGNOSIS — Z0000 Encounter for general adult medical examination without abnormal findings: Secondary | ICD-10-CM | POA: Diagnosis not present

## 2019-08-08 NOTE — Progress Notes (Signed)
Subjective:   Jennifer Moss is a 68 y.o. female who presents for Medicare Annual preventive examination.  Review of Systems: Defer to PCP   Objective:     Vitals: BP 105/75   Pulse 100   Temp 98.1 F (36.7 C) (Oral)   Ht 5\' 2"  (1.575 m)   Wt 145 lb (65.8 kg)   SpO2 96%   BMI 26.52 kg/m   Body mass index is 26.52 kg/m.  Advanced Directives 08/08/2019 07/09/2018 04/28/2017 09/22/2016 09/15/2016 04/07/2016 11/14/2015  Does Patient Have a Medical Advance Directive? No No No No No No No  Type of Advance Directive - - - - - - -  Copy of Healthcare Power of Attorney in Chart? - - - - - - -  Would patient like information on creating a medical advance directive? Yes (MAU/Ambulatory/Procedural Areas - Information given) No - Patient declined (No Data) No - patient declined information No - patient declined information No - patient declined information No - patient declined information  Pre-existing out of facility DNR order (yellow form or pink MOST form) - - - - - - -    Tobacco Smokes the occasional "puff" of a cigarette every now and then. Patient states, "not worth mentioning."   Clinical Intake:  Pre-visit preparation completed: Yes  Pain Score: 6   How often do you need to have someone help you when you read instructions, pamphlets, or other written materials from your doctor or pharmacy?: 2 - Rarely What is the last grade level you completed in school?: high school  Interpreter Needed?: No  Past Medical History:  Diagnosis Date  . Anginal pain ([place])   . Arthritis   . Chronic kidney disease    rt kidney horseshoe  . Diverticulitis   . Drug abuse ([place])   . Hyperlipemia   . Hypertension   . Mallory-Weiss tear   . Muscle spasms of both lower extremities   . Rectal bleed   . Retinal detachment    R eye, per patient  . Shortness of breath    Past Surgical History:  Procedure Laterality Date  . APPENDECTOMY    . COLONOSCOPY N/A 04/11/2015   Procedure: COLONOSCOPY;   Surgeon: Danie Binder, MD;  [facility];  Service: Endoscopy;  Laterality: N/A;  8:30 AM  . DIAGNOSTIC LAPAROSCOPY     for horseshoe kidney  . ESOPHAGOGASTRODUODENOSCOPY  12/17/2012   Procedure: ESOPHAGOGASTRODUODENOSCOPY (EGD);  Surgeon: Wonda Horner, MD;  Location: [facility] ENDOSCOPY;  Service: Endoscopy;  Laterality: N/A;  . Lipoma removal     in 1990s had an egg-sized tumor removed from her leg, she thinks it was a lipoma  . repair of detached retina    . TUBAL LIGATION     Family History  Problem Relation Age of Onset  . Coronary artery disease Father   . Heart attack Father   . Asthma Father   . Hypertension Mother   . Asthma Mother   . Coronary artery disease Maternal Grandmother   . Coronary artery disease Maternal Uncle   . Diabetes Sister   . Diabetes Paternal Grandmother   . Kidney disease Paternal Grandmother   . Colon cancer Neg Hx   . Breast cancer Neg Hx    Social History   Socioeconomic History  . Marital status: Legally Separated    Spouse name: Not on file  . Number of children: 1  . Years of education: 3  . Highest education level: High  school graduate  Occupational History  . Occupation: Retired    Comment: Training and development officer at [employer]  . Financial resource strain: Not hard at all  . Food insecurity    Worry: Never true    Inability: Never true  . Transportation needs    Medical: No    Non-medical: No  Tobacco Use  . Smoking status: Never Smoker  . Smokeless tobacco: Never Used  Substance and Sexual Activity  . Alcohol use: Yes    Alcohol/week: 0.0 standard drinks    Comment: occassional drink at holidays  . Drug use: No    Frequency: 3.0 times per week    Types: Marijuana, Cocaine    Comment: cocaine and THC + in UDS in November 2013. Last used 1 year ago as of 04/11/15  . Sexual activity: Not Currently  Lifestyle  . Physical activity    Days per week: 0 days    Minutes per session: 0 min  . Stress: Only a little  Relationships   . Social connections    Talks on phone: More than three times a week    Gets together: Three times a week    Attends religious service: More than 4 times per year    Active member of club or organization: No    Attends meetings of clubs or organizations: Never    Relationship status: Separated  Other Topics Concern  . Not on file  Social History Narrative   Patient lives alone in [place]. Patients daughter just recently moved out.    Patient is retired and enjoying it.   Wants to get back into walking. Goal set.   Patient is active with her church, which now is mostly virtual.       -Has history of smoking crack cocaine and is trying hard to stop. Has been going to cocaine support group. It's been hard because she takes the bus. When she feels the urge to smoke she prays, reads the Bible, or calls her sister for support. She does well if she stays active, but the times when she most wants to smoke crack is if she is bored at home. *Patient denies any recent use.*      -Also uses marijuana every few months. *Denies any recent use.*       Outpatient Encounter Medications as of 08/08/2019  Medication Sig  . amLODipine (NORVASC) 10 MG tablet Take 1 tablet (10 mg total) by mouth daily.  Marland Kitchen aspirin EC 81 MG EC tablet Take 1 tablet (81 mg total) by mouth daily.  Marland Kitchen lactulose (CHRONULAC) 10 GM/15ML solution Take 15 mLs (10 g total) by mouth every other day.  . lisinopril (ZESTRIL) 20 MG tablet Take 1 tablet (20 mg total) by mouth daily.  . Omega-3 Fatty Acids (FISH OIL PO) Take 1 tablet by mouth daily.   No facility-administered encounter medications on file as of 08/08/2019.     Activities of Daily Living In your present state of health, do you have any difficulty performing the following activities: 08/08/2019 08/08/2019  Hearing? N N  Vision? N N  Difficulty concentrating or making decisions? N N  Walking or climbing stairs? N N  Dressing or bathing? N N  Doing errands, shopping? N N   Preparing Food and eating ? N -  Using the Toilet? N -  In the past six months, have you accidently leaked urine? N -  Do you have problems with loss of bowel control? N -  Managing your Medications? N -  Managing your Finances? N -  Housekeeping or managing your Housekeeping? N -  Some recent data might be hidden    Patient Care Team: Leeanne Rio, MD as PCP - General (Family Medicine) Rexene Agent, MD as Attending Physician (Nephrology) Fonnie Mu, OD (Optometry)    Assessment:   This is a routine wellness examination for [place].  Exercise Activities and Dietary recommendations Current Exercise Habits: The patient does not participate in regular exercise at present, Exercise limited by: None identified  Goals    . Exercise 3x per week (1 hour per time) (pt-stated)     Patient has not been as active as she usually is due to Pandemic.  Patients wants to get back in her walking routine once the weather gets cooler.       Fall Risk Fall Risk  08/08/2019 07/09/2018 04/28/2017 06/19/2015 04/13/2015  Falls in the past year? 0 No No No No  Number falls in past yr: 0 - - - -   Is the patient's home free of loose throw rugs in walkways, pet beds, electrical cords, etc? yes      Grab bars in the bathroom? yes      Handrails on the stairs?   yes      Adequate lighting?  yes  Patient rating of health (0-10) scale: 7  Depression Screen PHQ 2/9 Scores 08/08/2019 07/09/2018 08/28/2017 04/28/2017  PHQ - 2 Score 0 0 0 0  PHQ- 9 Score - - - -     Cognitive Function   6CIT Screen 08/08/2019  What Year? 0 points  What month? 0 points  What time? 0 points  Count back from 20 0 points  Months in reverse 2 points  Repeat phrase 0 points  Total Score 2    Immunization History  Administered Date(s) Administered  . PPD Test 09/23/2013, 09/27/2014  . Tdap 04/01/2013    Screening Tests Health Maintenance  Topic Date Due  . DEXA SCAN  11/23/2018  . PNA vac Low Risk Adult  (1 of 2 - PCV13) 11/23/2018  . MAMMOGRAM  07/22/2019  . INFLUENZA VACCINE  07/30/2019  . COLONOSCOPY  04/10/2020  . TETANUS/TDAP  04/02/2023  . Hepatitis C Screening  Completed  . HIV Screening  Completed    Cancer Screenings: Lung: Low Dose CT Chest recommended if Age 30-80 years, 30 pack-year currently smoking OR have quit w/in 15years. Patient does not qualify. Breast:  Up to date on Mammogram? Yes  Next scheduled 09/28/2019 Up to date of Bone Density/Dexa? Yes Next scheduled 09/28/2019 Colorectal: Due April 2021  Additional Screenings: Hepatitis C Screening: Completed   Plan:  Look over the advanced directive packet with your daughter.  Once the weather cools down, start walking outside 3x per week.    I have personally reviewed and noted the following in the patient's chart:   . Medical and social history . Use of alcohol, tobacco or illicit drugs  . Current medications and supplements . Functional ability and status . Nutritional status . Physical activity . Advanced directives . List of other physicians . Hospitalizations, surgeries, and ER visits in previous 12 months . Vitals . Screenings to include cognitive, depression, and falls . Referrals and appointments  In addition, I have reviewed and discussed with patient certain preventive protocols, quality metrics, and best practice recommendations. A written personalized care plan for preventive services as well as general preventive health recommendations were provided to patient.  Dorna Bloom, [place]  08/08/2019

## 2019-08-08 NOTE — Patient Instructions (Addendum)
You spoke to Jennifer Moss, [place] for your annual wellness visit.  We discussed goals: Goals    . Exercise 3x per week (1 hour per time) (pt-stated)     Patient has not been as active as she usually is due to Pandemic.  Patients wants to get back in her walking routine once the weather gets cooler.       We also discussed recommended health maintenance. Please call our office and schedule a visit. As discussed, you are due for PNA and flu vaccines. Keep your up coming apt for mammogram and dexa. Schedule a follow-up with PCP in early fall, you can get your PNA and flu vaccines then.  Health Maintenance  Topic Date Due  . DEXA SCAN  11/23/2018  . PNA vac Low Risk Adult (1 of 2 - PCV13) 11/23/2018  . MAMMOGRAM  07/22/2019  . INFLUENZA VACCINE  07/30/2019  . COLONOSCOPY  04/10/2020  . TETANUS/TDAP  04/02/2023  . Hepatitis C Screening  Completed  . HIV Screening  Completed    We also discussed advanced directive. Look over with your daughter and bring completed packet with you to next office visit.    Health Maintenance After Age 35 After age 70, you are at a higher risk for certain long-term diseases and infections as well as injuries from falls. Falls are a major cause of broken bones and head injuries in people who are older than age 30. Getting regular preventive care can help to keep you healthy and well. Preventive care includes getting regular testing and making lifestyle changes as recommended by your health care provider. Talk with your health care provider about:  Which screenings and tests you should have. A screening is a test that checks for a disease when you have no symptoms.  A diet and exercise plan that is right for you. What should I know about screenings and tests to prevent falls? Screening and testing are the best ways to find a health problem early. Early diagnosis and treatment give you the best chance of managing medical conditions that are common after age 1.  Certain conditions and lifestyle choices may make you more likely to have a fall. Your health care provider may recommend:  Regular vision checks. Poor vision and conditions such as cataracts can make you more likely to have a fall. If you wear glasses, make sure to get your prescription updated if your vision changes.  Medicine review. Work with your health care provider to regularly review all of the medicines you are taking, including over-the-counter medicines. Ask your health care provider about any side effects that may make you more likely to have a fall. Tell your health care provider if any medicines that you take make you feel dizzy or sleepy.  Osteoporosis screening. Osteoporosis is a condition that causes the bones to get weaker. This can make the bones weak and cause them to break more easily.  Blood pressure screening. Blood pressure changes and medicines to control blood pressure can make you feel dizzy.  Strength and balance checks. Your health care provider may recommend certain tests to check your strength and balance while standing, walking, or changing positions.  Foot health exam. Foot pain and numbness, as well as not wearing proper footwear, can make you more likely to have a fall.  Depression screening. You may be more likely to have a fall if you have a fear of falling, feel emotionally low, or feel unable to do activities that you  used to do.  Alcohol use screening. Using too much alcohol can affect your balance and may make you more likely to have a fall. What actions can I take to lower my risk of falls? General instructions  Talk with your health care provider about your risks for falling. Tell your health care provider if: ? You fall. Be sure to tell your health care provider about all falls, even ones that seem minor. ? You feel dizzy, sleepy, or off-balance.  Take over-the-counter and prescription medicines only as told by your health care provider. These  include any supplements.  Eat a healthy diet and maintain a healthy weight. A healthy diet includes low-fat dairy products, low-fat (lean) meats, and fiber from whole grains, beans, and lots of fruits and vegetables. Home safety  Remove any tripping hazards, such as rugs, cords, and clutter.  Install safety equipment such as grab bars in bathrooms and safety rails on stairs.  Keep rooms and walkways well-lit. Activity   Follow a regular exercise program to stay fit. This will help you maintain your balance. Ask your health care provider what types of exercise are appropriate for you.  If you need a cane or walker, use it as recommended by your health care provider.  Wear supportive shoes that have nonskid soles. Lifestyle  Do not drink alcohol if your health care provider tells you not to drink.  If you drink alcohol, limit how much you have: ? 0-1 drink a day for women. ? 0-2 drinks a day for men.  Be aware of how much alcohol is in your drink. In the [place] one drink equals one typical bottle of beer (12 oz), one-half glass of wine (5 oz), or one shot of hard liquor (1 oz).  Do not use any products that contain nicotine or tobacco, such as cigarettes and e-cigarettes. If you need help quitting, ask your health care provider. Summary  Having a healthy lifestyle and getting preventive care can help to protect your health and wellness after age 74.  Screening and testing are the best way to find a health problem early and help you avoid having a fall. Early diagnosis and treatment give you the best chance for managing medical conditions that are more common for people who are older than age 69.  Falls are a major cause of broken bones and head injuries in people who are older than age 75. Take precautions to prevent a fall at home.  Work with your health care provider to learn what changes you can make to improve your health and wellness and to prevent falls. This information is  not intended to replace advice given to you by your health care provider. Make sure you discuss any questions you have with your health care provider. Document Released: 10/28/2017 Document Revised: 04/07/2019 Document Reviewed: 10/28/2017 Elsevier Patient Education  2020 [facility]'s number is [phone]. Please call with questions or concerns about what we discussed today.

## 2019-08-09 NOTE — Progress Notes (Signed)
I have reviewed this visit and agree with the documentation.   

## 2019-08-31 ENCOUNTER — Other Ambulatory Visit: Payer: Self-pay

## 2019-08-31 DIAGNOSIS — Z20822 Contact with and (suspected) exposure to covid-19: Secondary | ICD-10-CM

## 2019-09-01 LAB — NOVEL CORONAVIRUS, NAA: SARS-CoV-2, NAA: NOT DETECTED

## 2019-09-16 ENCOUNTER — Other Ambulatory Visit: Payer: Self-pay | Admitting: Family Medicine

## 2019-09-19 ENCOUNTER — Other Ambulatory Visit: Payer: Self-pay

## 2019-09-19 MED ORDER — AMLODIPINE BESYLATE 10 MG PO TABS: 10.0000 mg | ORAL_TABLET | Freq: Every day | ORAL | 0 refills | Status: DC

## 2019-09-21 ENCOUNTER — Other Ambulatory Visit: Payer: Self-pay | Admitting: Family Medicine

## 2019-09-28 ENCOUNTER — Ambulatory Visit: Payer: Medicare Other

## 2019-09-28 ENCOUNTER — Other Ambulatory Visit: Payer: Medicare Other

## 2019-10-25 ENCOUNTER — Other Ambulatory Visit: Payer: Self-pay

## 2019-10-25 NOTE — Patient Outreach (Signed)
[facility]) Care Management  10/25/2019  Jennifer Moss Jul 21, 1951 [id number]   Medication Adherence call to [name] [facility] Identifiers Verify spoke with patient she is past due on Lovastatin 40 mg patient explain doctor discontinued this medication back in May,patient is no longer taking it. [name] is showing past due under [place].   [place] Management Direct Dial [phone]  Fax [phone] [name]

## 2019-10-28 ENCOUNTER — Other Ambulatory Visit: Payer: Self-pay

## 2019-10-28 ENCOUNTER — Ambulatory Visit (INDEPENDENT_AMBULATORY_CARE_PROVIDER_SITE_OTHER): Payer: Medicare Other | Admitting: Family Medicine

## 2019-10-28 VITALS — BP 115/80 | HR 83 | Wt 152.2 lb

## 2019-10-28 DIAGNOSIS — I10 Essential (primary) hypertension: Secondary | ICD-10-CM

## 2019-10-28 DIAGNOSIS — E785 Hyperlipidemia, unspecified: Secondary | ICD-10-CM

## 2019-10-28 DIAGNOSIS — E663 Overweight: Secondary | ICD-10-CM | POA: Insufficient documentation

## 2019-10-28 DIAGNOSIS — Z23 Encounter for immunization: Secondary | ICD-10-CM

## 2019-10-28 DIAGNOSIS — R635 Abnormal weight gain: Secondary | ICD-10-CM

## 2019-10-28 DIAGNOSIS — R079 Chest pain, unspecified: Secondary | ICD-10-CM

## 2019-10-28 DIAGNOSIS — K5900 Constipation, unspecified: Secondary | ICD-10-CM

## 2019-10-28 DIAGNOSIS — N183 Chronic kidney disease, stage 3 unspecified: Secondary | ICD-10-CM | POA: Diagnosis not present

## 2019-10-28 NOTE — Assessment & Plan Note (Signed)
Doing well with lactulose. Continue this medication.

## 2019-10-28 NOTE — Progress Notes (Signed)
Date of Visit: 10/28/2019   HPI:  Patient presents for routine follow up. No acute issues to discuss today.  Hypertension - currently taking amlodipine 10mg  daily and lisinopril 20mg  daily. Tolerating these well. See below re: occasional chest pain   CKD - visit with neprho in May was canceled by that office. Patient thinks she is just going to follow up next May. Due for recheck of renal function today.  Constipation - taking lactulose every other day with good results. No blood in stool.  Hyperlipidemia - fasting today. Prev did not tolerate lovastatin.  Follow up on chest pain - earlier this year I referred her to cardiology due to chest pain she was having, but she decided not to schedule an appointment with them. Reports since that time chest pain is actually improving some, happening less often and not as severe. Pain is dull in mid chest. Lasts 5-10 seconds. Happens at rest or with exertion. No radiation. Mild shortness of breath with the pain but not significant per patient. Patient would like to see cardiology now, agreeable to schedule.  Weight gain - has been walking and trying to eat healthy but gaining weight still in setting of COVID pandemic. Wants to see nutritionist.  ROS: See HPI.  [place]: history of horseshoe kidney, hyperlipidemia, hypertension, overweight, constipation, CKD  PHYSICAL EXAM: BP 115/80   Pulse 83   Wt 152 lb 3.2 oz (69 kg)   SpO2 100%   BMI 27.84 kg/m  Gen: no acute distress, pleasant, cooperative HEENT: normocephalic, atraumatic, no thyromegaly or nodules Heart: regular rate and rhythm, no murmur Lungs: clear to auscultation bilaterally, normal work of breathing  Neuro: alert, speech normal Abdomen: soft, nontender to palpation  Ext: No appreciable lower extremity edema bilaterally   ASSESSMENT/PLAN:  Health maintenance:  -flu shot given today  -pneumovax23 given today  Chronic kidney disease, stage 3 ([place]) Chronic issue. Check renal  function today with CMET to monitor stability. May need to get her back into nephro sooner than next year since she missed the appointment this spring. If function is stable will wait for her next routine appointment with neprho.  Hypertension Well controlled. Continue current regimen.   Chest pain Intermittent occasional chest pain, none currently, patient deferred cardiology evaluation back in the spring but is now agreeable. Pain actually improving some, not worsening, so do not think she needs new EKG today. Will replace referral.  Constipation Doing well with lactulose. Continue this medication.   Overweight Discussed healthy eating and exercise. Checking TSH given weight gain despite recent exercise.  Hyperlipidemia Update lipid panel today. Previously intolerant of lovastatin, could consider trial of atorv or rosuvastatin but will await lipid results.  FOLLOW UP: Follow up in 6 mos for above issues  [place] J. Ardelia Mems, [place]

## 2019-10-28 NOTE — Patient Instructions (Signed)
It was great to see you again today!  Checking kidneys, liver, cholesterol, thyroid Flu shot and pneumonia shot today Follow up with me in 6 months  Be well, Dr. Ardelia Mems

## 2019-10-28 NOTE — Assessment & Plan Note (Signed)
Well-controlled.  Continue current regimen. 

## 2019-10-28 NOTE — Assessment & Plan Note (Signed)
Discussed healthy eating and exercise. Checking TSH given weight gain despite recent exercise.

## 2019-10-28 NOTE — Assessment & Plan Note (Signed)
Chronic issue. Check renal function today with CMET to monitor stability. May need to get her back into nephro sooner than next year since she missed the appointment this spring. If function is stable will wait for her next routine appointment with neprho.

## 2019-10-28 NOTE — Assessment & Plan Note (Addendum)
Intermittent occasional chest pain, none currently, patient deferred cardiology evaluation back in the spring but is now agreeable. Pain actually improving some, not worsening, so do not think she needs new EKG today. Will replace referral.

## 2019-10-28 NOTE — Assessment & Plan Note (Signed)
Update lipid panel today. Previously intolerant of lovastatin, could consider trial of atorv or rosuvastatin but will await lipid results.

## 2019-10-29 LAB — CMP14+EGFR
ALT: 23 IU/L (ref 0–32)
AST: 24 IU/L (ref 0–40)
Albumin/Globulin Ratio: 2 (ref 1.2–2.2)
Albumin: 4.7 g/dL (ref 3.8–4.8)
Alkaline Phosphatase: 111 IU/L (ref 39–117)
BUN/Creatinine Ratio: 10 — ABNORMAL LOW (ref 12–28)
BUN: 18 mg/dL (ref 8–27)
Bilirubin Total: 0.2 mg/dL (ref 0.0–1.2)
CO2: 21 mmol/L (ref 20–29)
Calcium: 9.5 mg/dL (ref 8.7–10.3)
Chloride: 108 mmol/L — ABNORMAL HIGH (ref 96–106)
Creatinine, Ser: 1.87 mg/dL — ABNORMAL HIGH (ref 0.57–1.00)
GFR calc Af Amer: 32 mL/min/1.73 — ABNORMAL LOW (ref >59)
GFR calc non Af Amer: 28 mL/min/1.73 — ABNORMAL LOW (ref >59)
Globulin, Total: 2.3 g/dL (ref 1.5–4.5)
Glucose: 113 mg/dL — ABNORMAL HIGH (ref 65–99)
Potassium: 5 mmol/L (ref 3.5–5.2)
Sodium: 143 mmol/L (ref 134–144)
Total Protein: 7 g/dL (ref 6.0–8.5)

## 2019-10-29 LAB — LIPID PANEL
Chol/HDL Ratio: 2.8 ratio (ref 0.0–4.4)
Cholesterol, Total: 210 mg/dL — ABNORMAL HIGH (ref 100–199)
HDL: 75 mg/dL (ref >39)
LDL Chol Calc (NIH): 117 mg/dL — ABNORMAL HIGH (ref 0–99)
Triglycerides: 104 mg/dL (ref 0–149)
VLDL Cholesterol Cal: 18 mg/dL (ref 5–40)

## 2019-10-29 LAB — TSH: TSH: 0.574 u[IU]/mL (ref 0.450–4.500)

## 2019-10-31 ENCOUNTER — Telehealth: Payer: Self-pay | Admitting: Family Medicine

## 2019-10-31 DIAGNOSIS — R7301 Impaired fasting glucose: Secondary | ICD-10-CM

## 2019-10-31 NOTE — Telephone Encounter (Signed)
Called patient to discuss labs. Creatinine elevated above prior. Repeat BMET later this week. Fasting glucose elevated at 113. Check A1c with lab later in the week. Lab visit scheduled, patient counseled and is appreciative. Advised staying hydrated, avoiding NSAIDs Advised if renal function remains low will get her in sooner with nephro.  Leeanne Rio, MD

## 2019-11-03 ENCOUNTER — Other Ambulatory Visit: Payer: Self-pay

## 2019-11-03 ENCOUNTER — Other Ambulatory Visit (INDEPENDENT_AMBULATORY_CARE_PROVIDER_SITE_OTHER): Payer: Medicare Other

## 2019-11-03 DIAGNOSIS — R7301 Impaired fasting glucose: Secondary | ICD-10-CM

## 2019-11-03 LAB — POCT GLYCOSYLATED HEMOGLOBIN (HGB A1C): Hemoglobin A1C: 5.7 % — AB (ref 4.0–5.6)

## 2019-11-04 LAB — BASIC METABOLIC PANEL
BUN/Creatinine Ratio: 10 — ABNORMAL LOW (ref 12–28)
BUN: 19 mg/dL (ref 8–27)
CO2: 19 mmol/L — ABNORMAL LOW (ref 20–29)
Calcium: 9.8 mg/dL (ref 8.7–10.3)
Chloride: 105 mmol/L (ref 96–106)
Creatinine, Ser: 1.83 mg/dL — ABNORMAL HIGH (ref 0.57–1.00)
GFR calc Af Amer: 33 mL/min/{1.73_m2} — ABNORMAL LOW (ref >59)
GFR calc non Af Amer: 29 mL/min/{1.73_m2} — ABNORMAL LOW (ref >59)
Glucose: 106 mg/dL — ABNORMAL HIGH (ref 65–99)
Potassium: 5 mmol/L (ref 3.5–5.2)
Sodium: 142 mmol/L (ref 134–144)

## 2019-11-22 ENCOUNTER — Other Ambulatory Visit: Payer: Self-pay | Admitting: Family Medicine

## 2019-11-30 ENCOUNTER — Telehealth: Payer: Self-pay | Admitting: Family Medicine

## 2019-11-30 NOTE — Telephone Encounter (Signed)
Called patient to discuss labs since creatinine remained elevated above prior values Recommend she get in sooner rather than later with nephrology - she will call to schedule a visit with them since she is established there.  She also brought up that she had a recent viist in August from an insurance nurse who evidently measured bloodflow to her feet and said her R foot had decreased bloodflow. I assume this was an ABI. Recommend she come in for a visit to assess. Appointment scheduled for 12/17 at 8:30a. Patient appreciative.  Leeanne Rio, MD

## 2019-12-01 ENCOUNTER — Ambulatory Visit: Payer: Medicare Other | Admitting: Interventional Cardiology

## 2019-12-06 ENCOUNTER — Other Ambulatory Visit: Payer: Self-pay

## 2019-12-06 ENCOUNTER — Ambulatory Visit (INDEPENDENT_AMBULATORY_CARE_PROVIDER_SITE_OTHER): Payer: Medicare Other | Admitting: Family Medicine

## 2019-12-06 DIAGNOSIS — N183 Chronic kidney disease, stage 3 unspecified: Secondary | ICD-10-CM | POA: Diagnosis not present

## 2019-12-06 DIAGNOSIS — R7303 Prediabetes: Secondary | ICD-10-CM | POA: Diagnosis not present

## 2019-12-06 NOTE — Patient Instructions (Addendum)
  Diet Recommendations for Pre-diabetes  Carbohydrate includes starch, sugar, and fiber.  Of these, only sugar and starch raise blood glucose.  (Fiber is found in fruits, vegetables [especially skin, seeds, and stalks] and whole grains.)   Starchy (carb) foods: Bread, rice, pasta, potatoes, corn, cereal, grits, crackers, bagels, muffins, all baked goods.  (Fruit, milk, and yogurt also have carbohydrate, but most of these foods will not spike your blood sugar as most starchy foods will.)  A few fruits do cause high blood sugars; use small portions of bananas (limit to 1/2 at a time), grapes, watermelon, oranges, and most tropical fruits.   Protein foods: Meat, fish, poultry, eggs, dairy foods, and beans such as pinto and kidney beans (beans also provide carbohydrate).   1. Eat at least REAL 3 meals and 1 snack per day. Have something to eat at least every 5 hours while awake.  Eat breakfast within the first hour of getting up.   2. Limit starchy foods to ONE or TWO per meal and ONE per snack. ONE portion of a starchy food is equal to the following:   - ONE slice of bread (or its equivalent, such as half of a hamburger bun).   - 1/2 cup of a "scoopable" starchy food such as potatoes or rice.   - 15 grams of Total Carbohydrate as shown on food label.   - Every 4 ounces of a sweet drink (including fruit juice). 3. Include at every meal: a protein food, a carb food, and vegetables and/or fruit.   - Obtain twice the volume of veg's as protein or carbohydrate foods for both lunch and dinner.   - Fresh or frozen veg's are best.   - Keep frozen veg's on hand for a quick vegetable serving.     - If you use canned soup sometimes, first microwave some vegetables, then add soup, and reheat.    Follow-up: Telehealth visit by phone on December 29 at 4 PM.

## 2019-12-06 NOTE — Progress Notes (Signed)
Telehealth Encounter PCP Chrisandra Netters, MD I connected with SYEIRA STAVE (MRN [id number]) on 12/06/2019 by telephone after failing to connect with video-enabled application, verified that I am speaking with the correct person using two identifiers, and that the patient was in a private environment conducive to confidentiality.  The patient agreed to proceed.  Provider was Kennith Center, PhD, RD, LDN, CEDRD Provider(s) located at [facility] during this telehealth encounter; patient was at home  Appt start time: 1500 end time: 1600 (1 hour)  Reason for telehealth visit: Referred by Dr. Ardelia Mems for Medical Nutrition Therapy for CKD Stage 3 (N18.30) and prediabetes (R73.03).   Relevant history/background: Ms. Derington has a h/o CKD, HTN, HLD, diverticulosis, and constipation.  Her BMI is 29 with correct height of 60" (weight 152 lb on 10/30).  Ms. Thole is interested in preserving renal function, preventing DM, and losing weight.  Assessment:  Usual eating pattern: 2-3 meals and 0 snacks per day.  Has cut way back on food intake since 10/30 appt with Dr. Ardelia Mems.   Frequent foods and beverages: water, coffee with Splenda, powdered creamer, diet Kool-aid; egg, sausage, grits, salad, cooked greens, chx.   Avoided foods: high-sodium foods.   Usual physical activity: Walk-away-the-pounds program for 20 min ~3 X wk and walks 13 steps (~5  min) 2 X wk. Sleep: Estimates she gets 6-7 hours of sleep per night.   24-hr recall:  (Up at 8 AM) B (8:30 AM)-  [employer] egg&sausage biscuit, 4 hashrounds, coffee, creamer, Splenda   Snk ( AM)-  water L ( PM)-  --- Snk ( PM)-  water D (5 PM)-  3 slc meat lover's pizza, water Snk ( PM)-  water Typical day? No. Typical day includes breakfast or brunch and one more meal of salad.  Seldom gets takeout, as she did yesterday.    Intervention: Completed diet and exercise history, and established behavioral goals primarily aimed at better  managing blood glucose levels and CKD.    For recommendations and goals, see Patient Instructions.    Follow-up: Telehealth visit in 3 weeks.    SYKES,JEANNIE

## 2019-12-15 ENCOUNTER — Ambulatory Visit
Admission: RE | Admit: 2019-12-15 | Discharge: 2019-12-15 | Disposition: A | Discharge status: Home / Self Care | Payer: Medicare Other | Source: Ambulatory Visit | Attending: Family Medicine | Admitting: Family Medicine

## 2019-12-15 ENCOUNTER — Other Ambulatory Visit: Payer: Self-pay

## 2019-12-15 ENCOUNTER — Ambulatory Visit (INDEPENDENT_AMBULATORY_CARE_PROVIDER_SITE_OTHER): Payer: Medicare Other | Admitting: Family Medicine

## 2019-12-15 ENCOUNTER — Encounter: Payer: Self-pay | Admitting: Family Medicine

## 2019-12-15 ENCOUNTER — Other Ambulatory Visit: Payer: Self-pay | Admitting: Family Medicine

## 2019-12-15 DIAGNOSIS — Z780 Asymptomatic menopausal state: Secondary | ICD-10-CM | POA: Diagnosis not present

## 2019-12-15 DIAGNOSIS — Z1231 Encounter for screening mammogram for malignant neoplasm of breast: Secondary | ICD-10-CM | POA: Diagnosis not present

## 2019-12-15 DIAGNOSIS — E2839 Other primary ovarian failure: Secondary | ICD-10-CM

## 2019-12-15 MED ORDER — LACTULOSE 10 GM/15ML PO SOLN: 10.0000 g | ORAL | 1 refills | Status: DC

## 2019-12-16 NOTE — Progress Notes (Signed)
Patient came for appointment and was briefly seen by the medical student working with me, but before I was able to see and evaluate her, she had to leave for another appointment. I briefly spoke with her and we agreed to postpone the visit until next week. New appointment scheduled. Patient appreciative & agreeable.  Jennifer Rio, MD

## 2019-12-20 ENCOUNTER — Other Ambulatory Visit: Payer: Self-pay

## 2019-12-20 ENCOUNTER — Encounter: Payer: Self-pay | Admitting: Family Medicine

## 2019-12-20 ENCOUNTER — Ambulatory Visit (INDEPENDENT_AMBULATORY_CARE_PROVIDER_SITE_OTHER): Payer: Medicare Other | Admitting: Family Medicine

## 2019-12-20 VITALS — BP 110/72 | HR 72 | Wt 153.0 lb

## 2019-12-20 DIAGNOSIS — N183 Chronic kidney disease, stage 3 unspecified: Secondary | ICD-10-CM

## 2019-12-20 DIAGNOSIS — K5900 Constipation, unspecified: Secondary | ICD-10-CM

## 2019-12-20 DIAGNOSIS — R6889 Other general symptoms and signs: Secondary | ICD-10-CM | POA: Diagnosis not present

## 2019-12-20 MED ORDER — LACTULOSE 10 GM/15ML PO SOLN: 10.0000 g | ORAL | 1 refills | Status: DC

## 2019-12-20 NOTE — Assessment & Plan Note (Signed)
Stable, refill lactulose

## 2019-12-20 NOTE — Patient Instructions (Signed)
Checking formal testing of blood flow to your legs  Get in with your kidney doctor  Call with any questions  Be well, Dr. Ardelia Mems

## 2019-12-20 NOTE — Progress Notes (Signed)
Date of Visit: 12/20/2019   HPI:  Jennifer Moss presents for abnormal ABI's  Had ABIs done as part of a home visit with a nurse through her insurance. Was told that her R leg has decreased blood flow. Admits to having some sharp pain in hips and numbness in her toes occasionally. Able to walk around store without any problems with leg pain or fatigue.  Constipation - taking lactulose, works well for her, needs refill  CKD - has not yet followed up with renal  ROS: See HPI.  Harris: history of CKD, constipation, NF1, hypertension   PHYSICAL EXAM: BP 110/72   Pulse 72   Wt 153 lb (69.4 kg)   SpO2 99%   BMI 27.98 kg/m  Gen: no acute distress, pleasant, cooperative HEENT: normocephalic, atraumatic Resp: normal work of breathing Extremities: No appreciable lower extremity edema bilaterally . 2+ DP pulses bilaterally  ASSESSMENT/PLAN:  Health maintenance:  -current on HM items  Abnormal ABI - unclear if clinically significant. Our screening ABI's here in the clinic were different than the readings the insurance home visit provider got, as they had R leg being abnormal, but here L leg is abnormal. Discussed options with patient. After discussion she eleceted to proceed with obtaining formal ABI's to further assess. Order placed & scheduled.  Chronic kidney disease, stage 3 ([place]) Encouraged follow up with [facility], patient to call & schedule  Constipation Stable, refill lactulose  FOLLOW UP: Follow up in 3 mos for chronic medical issues Follow up with nephro in the interim  [place] J. Ardelia Mems, [place]

## 2019-12-20 NOTE — Assessment & Plan Note (Addendum)
Encouraged follow up with [facility], patient to call & schedule

## 2019-12-21 ENCOUNTER — Ambulatory Visit (HOSPITAL_COMMUNITY)
Admission: RE | Admit: 2019-12-21 | Discharge: 2019-12-21 | Disposition: A | Discharge status: Home / Self Care | Payer: Medicare Other | Source: Ambulatory Visit | Attending: Family Medicine | Admitting: Family Medicine

## 2019-12-21 ENCOUNTER — Encounter: Payer: Self-pay | Admitting: Family Medicine

## 2019-12-21 DIAGNOSIS — R6889 Other general symptoms and signs: Secondary | ICD-10-CM

## 2019-12-27 ENCOUNTER — Other Ambulatory Visit: Payer: Self-pay

## 2019-12-27 ENCOUNTER — Ambulatory Visit (INDEPENDENT_AMBULATORY_CARE_PROVIDER_SITE_OTHER): Payer: Medicare Other | Admitting: Family Medicine

## 2019-12-27 DIAGNOSIS — N183 Chronic kidney disease, stage 3 unspecified: Secondary | ICD-10-CM

## 2019-12-27 DIAGNOSIS — R7303 Prediabetes: Secondary | ICD-10-CM

## 2019-12-27 NOTE — Progress Notes (Signed)
Telehealth Encounter PCP Chrisandra Netters, MD I connected with KAYLAND YEUNG (MRN [id number]) on 12/27/2019 by telephone after failing to connect with video-enabled application, verified that I am speaking with the correct person using two identifiers, and that the patient was in a private environment conducive to confidentiality.  The patient agreed to proceed.  Provider was Kennith Center, PhD, RD, LDN, CEDRD Provider(s) located at [facility] during this telehealth encounter; patient was at home  Appt start time: 1600 end time: 1700 (1 hour)  Reason for telehealth visit: Referred by Dr. Ardelia Mems for Medical Nutrition Therapy for CKD Stage 3 (N18.30) and prediabetes (R73.03).   Relevant history/background: Ms. Pinkos has a h/o CKD, HTN, HLD, diverticulosis, and constipation.  Her BMI is 29 with correct height of 60" (weight 152 lb on 10/30).  Ms. Prosen is interested in preserving renal function, preventing DM, and losing weight.  Assessment:  Recent eating pattern: Still 2-3 meals and 0 snacks per day.   Usual physical activity: Has not exercised over holidays.   Sleep: Estimates she gets 6-7 hours of sleep per night.   24-hr recall:  (Up at 8:30 AM) B (9 AM)-  1 c coffee, 4 pkts Equal, 1 tbsp creamer, 1 sausage biscuit, 1 tsp jelly Snk ( AM)-  water L (2 PM)-  1/2 chx sandw, 1 tbsp mayo, water Snk ( PM)-  --- D (5 PM)-  1/2 chx sandw, 2 tbsp pintos, water Snk ( PM)-  water Typical day? Yes.   Progress on goals:  Even food distribution (>3 meals, 1 snk): Limit starches to 2/meal:  Balanced meal 3 X day:   Intervention: Reviewed progress on behavioral goals established on 12/06/19, and modifiedslightly.  Provided a Goals Sheet for patient to document progress, and encouraged her to use it daily.    For recommendations and goals, see Patient Instructions.    Follow-up: Telehealth visit in 4 weeks.    Traniece Boffa,JEANNIE

## 2019-12-27 NOTE — Patient Instructions (Addendum)
Modified from last visit: Diet Recommendations for Pre-diabetes  Carbohydrate includes starch, sugar, and fiber.  Of these, only sugar and starch raise blood glucose.  (Fiber is found in fruits, vegetables [especially skin, seeds, and stalks] and whole grains.)   Starchy (carb) foods: Bread, rice, pasta, potatoes, corn, cereal, grits, crackers, bagels, muffins, all baked goods.  (Fruit, milk, and yogurt also have carbohydrate, but most of these foods will not spike your blood sugar as most starchy foods will.)  A few fruits do cause high blood sugars; use small portions of bananas (limit to 1/2 at a time), grapes, watermelon, oranges, and most tropical fruits.   Protein foods: Meat, fish, poultry, eggs, dairy foods, and beans such as pinto and kidney beans (beans also provide carbohydrate).   1. Eat at least REAL 3 meals per day. Have something to eat at least every 5 hours while awake.  Eat breakfast within the first hour of getting up.   2. Limit starchy foods to ONE or TWO per meal and ONE per snack. ONE portion of a starchy food is equal to the following:              - ONE slice of bread (or its equivalent, such as half of a hamburger bun).              - 1/2 cup of a "scoopable" starchy food such as potatoes or rice.              - 15 grams of Total Carbohydrate as shown on food label.              - Every 4 ounces of a sweet drink (including fruit juice). 3. Include at every lunch and dinner: a protein food, a carb food, and vegetables and/or fruit.              - Obtain twice the volume of veg's as protein or carbohydrate foods for both lunch and dinner.              - Fresh or frozen veg's are best.              - Keep frozen veg's on hand for a quick vegetable serving.              - If you use canned soup sometimes, first microwave some vegetables, then add soup, and reheat.    - In addition: Mild exercise following a meal is a great way to help control blood sugar.  One thing that may  help you getting exercise is to plan the time you'll exercise the next day (or next week).   Explore the calendar feature on your smart phone, which you can use for planning exercise, including setting an ALERT to do it at the time.    - Document progress on your goals using the GOALS SHEET provided today.    Follow-up: Telehealth visit by phone on Tuesday, Jan 26 at 1:30 PM.

## 2019-12-29 ENCOUNTER — Other Ambulatory Visit: Payer: Self-pay | Admitting: Family Medicine

## 2020-01-11 ENCOUNTER — Other Ambulatory Visit: Payer: Self-pay

## 2020-01-11 ENCOUNTER — Ambulatory Visit (INDEPENDENT_AMBULATORY_CARE_PROVIDER_SITE_OTHER): Payer: Medicare Other | Admitting: Interventional Cardiology

## 2020-01-11 ENCOUNTER — Encounter: Payer: Self-pay | Admitting: Interventional Cardiology

## 2020-01-11 VITALS — BP 114/82 | HR 65 | Ht 62.0 in | Wt 152.4 lb

## 2020-01-11 DIAGNOSIS — I10 Essential (primary) hypertension: Secondary | ICD-10-CM

## 2020-01-11 DIAGNOSIS — R072 Precordial pain: Secondary | ICD-10-CM | POA: Diagnosis not present

## 2020-01-11 DIAGNOSIS — N1832 Chronic kidney disease, stage 3b: Secondary | ICD-10-CM

## 2020-01-11 NOTE — Progress Notes (Signed)
Cardiology Office Note   Date:  01/11/2020   ID:  Jennifer Moss, Jennifer Moss 1951-12-13, MRN [id number]  PCP:  Jennifer Rio, MD    No chief complaint on file.  Chest pain  Wt Readings from Last 3 Encounters:  01/11/20 152 lb 6.4 oz (69.1 kg)  12/20/19 153 lb (69.4 kg)  12/15/19 153 lb 12.8 oz (69.8 kg)       History of Present Illness: Jennifer Moss is a 69 y.o. female who is being seen today for the evaluation of chest pain at the request of Jennifer Rio, MD.  She has had some chest discomfort.  It is a dull ache in her chest lasting a few seconds.  SHe has had DOE, with walking up stairs.  She walked more in the warm weather but not as much in the winter weather.   Sx have been happening for a year, but due to COVID, she delayed the appt.  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   Several people in the family have had CAD.      Past Medical History:  Diagnosis Date  . Anginal pain ([place])   . Arthritis   . Chronic kidney disease    rt kidney horseshoe  . Diverticulitis   . Drug abuse ([place])   . Hyperlipemia   . Hypertension   . Mallory-Weiss tear   . Muscle spasms of both lower extremities   . Rectal bleed   . Retinal detachment    R eye, per patient  . Shortness of breath     Past Surgical History:  Procedure Laterality Date  . APPENDECTOMY    . COLONOSCOPY N/A 04/11/2015   Procedure: COLONOSCOPY;  Surgeon: Danie Binder, MD;  Location: [facility];  Service: Endoscopy;  Laterality: N/A;  8:30 AM  . DIAGNOSTIC LAPAROSCOPY     for horseshoe kidney  . ESOPHAGOGASTRODUODENOSCOPY  12/17/2012   Procedure: ESOPHAGOGASTRODUODENOSCOPY (EGD);  Surgeon: Wonda Horner, MD;  Location: [facility] ENDOSCOPY;  Service: Endoscopy;  Laterality: N/A;  . Lipoma removal     in 1990s had an egg-sized tumor removed from her leg, she thinks it was a lipoma  . repair of detached retina    . TUBAL LIGATION       Current Outpatient Medications  Medication Sig  Dispense Refill  . amLODipine (NORVASC) 10 MG tablet Take 1 tablet by mouth once daily 90 tablet 3  . aspirin EC 81 MG EC tablet Take 1 tablet (81 mg total) by mouth daily. 30 tablet 11  . lactulose (CHRONULAC) 10 GM/15ML solution Take 15 mLs (10 g total) by mouth every other day. 946 mL 1  . lisinopril (ZESTRIL) 20 MG tablet Take 1 tablet (20 mg total) by mouth daily. 90 tablet 0  . Omega-3 Fatty Acids (FISH OIL PO) Take 1 tablet by mouth daily.     No current facility-administered medications for this visit.    Allergies:   Patient has no known allergies.    Social History:  The patient  reports that she has never smoked. She has never used smokeless tobacco. She reports current alcohol use. She reports that she does not use drugs.   Family History:  The patient's family history includes Asthma in her father and mother; Coronary artery disease in her father, maternal grandmother, and maternal uncle; Diabetes in her paternal grandmother and sister; Heart attack in her father; Hypertension in her mother; Kidney disease in her paternal  grandmother.    ROS:  Please see the history of present illness.   Otherwise, review of systems are positive for DOE.   All other systems are reviewed and negative.    PHYSICAL EXAM: VS:  BP 114/82   Pulse 65   Ht 5\' 2"  (1.575 m)   Wt 152 lb 6.4 oz (69.1 kg)   SpO2 98%   BMI 27.87 kg/m  , BMI Body mass index is 27.87 kg/m. GEN: Well nourished, well developed, in no acute distress  HEENT: normal  Neck: no JVD, carotid bruits, or masses Cardiac: RRR; no murmurs, rubs, or gallops,no edema  Respiratory:  clear to auscultation bilaterally, normal work of breathing GI: soft, nontender, nondistended, + BS MS: no deformity or atrophy  Skin: warm and dry, no rash Neuro:  Strength and sensation are intact Psych: euthymic mood, full affect   EKG:   The ekg ordered today demonstrates NSR, nonspecif cST flattening   Recent Labs: 10/28/2019: ALT 23; TSH  0.574 11/03/2019: BUN 19; Creatinine, Ser 1.83; Potassium 5.0; Sodium 142   Lipid Panel    Component Value Date/Time   CHOL 210 (H) 10/28/2019 1036   TRIG 104 10/28/2019 1036   HDL 75 10/28/2019 1036   CHOLHDL 2.8 10/28/2019 1036   CHOLHDL 2.6 04/07/2016 0854   VLDL 19 04/07/2016 0854   LDLCALC 117 (H) 10/28/2019 1036     Other studies Reviewed: Additional studies/ records that were reviewed today with results demonstrating: labs reviewed- no CT given renal insufficiency.   ASSESSMENT AND PLAN:  1. Chest pain/DOE: Some RF for CAD.  Atypical sx.  Prior smoker.  We spoke about whole food plant based diet.  She does eat some meat but I think it would be best for her to minimize this.  2. HTN: The current medical regimen is effective;  continue present plan and medications. 3. CKD: Horseshoe kidney.  Avoid nephrotoxins.     Current medicines are reviewed at length with the patient today.  The patient concerns regarding her medicines were addressed.  The following changes have been made:  No change  Labs/ tests ordered today include:  No orders of the defined types were placed in this encounter.   Recommend 150 minutes/week of aerobic exercise Low fat, low carb, high fiber diet recommended  Disposition:   FU for stress test   Signed, Larae Grooms, MD  01/11/2020 9:50 AM    [facility] [address], [place], [place]  [address] Phone: [phone]; Fax: [phone]

## 2020-01-11 NOTE — Patient Instructions (Signed)
Medication Instructions:  Your physician recommends that you continue on your current medications as directed. Please refer to the Current Medication list given to you today.  If you need a refill on your cardiac medications before your next appointment, please call your pharmacy.   Lab work: None Ordered  If you have labs (blood work) drawn today and your tests are completely normal, you will receive your results only by: Marland Kitchen MyChart Message (if you have MyChart) OR . A paper copy in the mail If you have any lab test that is abnormal or we need to change your treatment, we will call you to review the results.  Testing/Procedures: You will need a COVID test done prior to exercise [facility]  Your physician has requested that you have en exercise stress myoview. For further information please visit [email or web address]. Please follow instruction sheet, as given.   Follow-Up: Based on test results  Any Other Special Instructions Will Be Listed Below (If Applicable).   Heart-Healthy Eating Plan Heart-healthy meal planning includes:  Eating less unhealthy fats.  Eating more healthy fats.  Making other changes in your diet. Talk with your doctor or a diet specialist (dietitian) to create an eating plan that is right for you. What is my plan? Your doctor may recommend an eating plan that includes:  Total fat: ______% or less of total calories a day.  Saturated fat: ______% or less of total calories a day.  Cholesterol: less than _________mg a day. What are tips for following this plan? Cooking Avoid frying your food. Try to bake, boil, grill, or broil it instead. You can also reduce fat by:  Removing the skin from poultry.  Removing all visible fats from meats.  Steaming vegetables in water or broth. Meal planning   At meals, divide your plate into four equal parts: ? Fill one-half of your plate with vegetables and green salads. ? Fill one-fourth of your plate with whole  grains. ? Fill one-fourth of your plate with lean protein foods.  Eat 4-5 servings of vegetables per day. A serving of vegetables is: ? 1 cup of raw or cooked vegetables. ? 2 cups of raw leafy greens.  Eat 4-5 servings of fruit per day. A serving of fruit is: ? 1 medium whole fruit. ?  cup of dried fruit. ?  cup of fresh, frozen, or canned fruit. ?  cup of 100% fruit juice.  Eat more foods that have soluble fiber. These are apples, broccoli, carrots, beans, peas, and barley. Try to get 20-30 g of fiber per day.  Eat 4-5 servings of nuts, legumes, and seeds per week: ? 1 serving of dried beans or legumes equals  cup after being cooked. ? 1 serving of nuts is  cup. ? 1 serving of seeds equals 1 tablespoon. General information  Eat more home-cooked food. Eat less restaurant, buffet, and fast food.  Limit or avoid alcohol.  Limit foods that are high in starch and sugar.  Avoid fried foods.  Lose weight if you are overweight.  Keep track of how much salt (sodium) you eat. This is important if you have high blood pressure. Ask your doctor to tell you more about this.  Try to add vegetarian meals each week. Fats  Choose healthy fats. These include olive oil and canola oil, flaxseeds, walnuts, almonds, and seeds.  Eat more omega-3 fats. These include salmon, mackerel, sardines, tuna, flaxseed oil, and ground flaxseeds. Try to eat fish at least 2 times each week.  Check  food labels. Avoid foods with trans fats or high amounts of saturated fat.  Limit saturated fats. ? These are often found in animal products, such as meats, butter, and cream. ? These are also found in plant foods, such as palm oil, palm kernel oil, and coconut oil.  Avoid foods with partially hydrogenated oils in them. These have trans fats. Examples are stick margarine, some tub margarines, cookies, crackers, and other baked goods. What foods can I eat? Fruits All fresh, canned (in natural juice), or  frozen fruits. Vegetables Fresh or frozen vegetables (raw, steamed, roasted, or grilled). Green salads. Grains Most grains. Choose whole wheat and whole grains most of the time. Rice and pasta, including brown rice and pastas made with whole wheat. Meats and other proteins Lean, well-trimmed beef, veal, pork, and lamb. Chicken and [place] without skin. All fish and shellfish. Wild duck, rabbit, pheasant, and venison. Egg whites or low-cholesterol egg substitutes. Dried beans, peas, lentils, and tofu. Seeds and most nuts. Dairy Low-fat or nonfat cheeses, including ricotta and mozzarella. Skim or 1% milk that is liquid, powdered, or evaporated. Buttermilk that is made with low-fat milk. Nonfat or low-fat yogurt. Fats and oils Non-hydrogenated (trans-free) margarines. Vegetable oils, including soybean, sesame, sunflower, olive, peanut, safflower, corn, canola, and cottonseed. Salad dressings or mayonnaise made with a vegetable oil. Beverages Mineral water. Coffee and tea. Diet carbonated beverages. Sweets and desserts Sherbet, gelatin, and fruit ice. Small amounts of dark chocolate. Limit all sweets and desserts. Seasonings and condiments All seasonings and condiments. The items listed above may not be a complete list of foods and drinks you can eat. Contact a dietitian for more options. What foods should I avoid? Fruits Canned fruit in heavy syrup. Fruit in cream or butter sauce. Fried fruit. Limit coconut. Vegetables Vegetables cooked in cheese, cream, or butter sauce. Fried vegetables. Grains Breads that are made with saturated or trans fats, oils, or whole milk. Croissants. Sweet rolls. Donuts. High-fat crackers, such as cheese crackers. Meats and other proteins Fatty meats, such as hot dogs, ribs, sausage, bacon, rib-eye roast or steak. High-fat deli meats, such as salami and bologna. Caviar. Domestic duck and goose. Organ meats, such as liver. Dairy Cream, sour cream, cream cheese, and  creamed cottage cheese. Whole-milk cheeses. Whole or 2% milk that is liquid, evaporated, or condensed. Whole buttermilk. Cream sauce or high-fat cheese sauce. Yogurt that is made from whole milk. Fats and oils Meat fat, or shortening. Cocoa butter, hydrogenated oils, palm oil, coconut oil, palm kernel oil. Solid fats and shortenings, including bacon fat, salt pork, lard, and butter. Nondairy cream substitutes. Salad dressings with cheese or sour cream. Beverages Regular sodas and juice drinks with added sugar. Sweets and desserts Frosting. Pudding. Cookies. Cakes. Pies. Milk chocolate or white chocolate. Buttered syrups. Full-fat ice cream or ice cream drinks. The items listed above may not be a complete list of foods and drinks to avoid. Contact a dietitian for more information. Summary  Heart-healthy meal planning includes eating less unhealthy fats, eating more healthy fats, and making other changes in your diet.  Eat a balanced diet. This includes fruits and vegetables, low-fat or nonfat dairy, lean protein, nuts and legumes, whole grains, and heart-healthy oils and fats. This information is not intended to replace advice given to you by your health care provider. Make sure you discuss any questions you have with your health care provider. Document Revised: 02/18/2018 Document Reviewed: 01/22/2018 Elsevier Patient Education  2020 [employer].

## 2020-01-17 DIAGNOSIS — N183 Chronic kidney disease, stage 3 unspecified: Secondary | ICD-10-CM | POA: Diagnosis not present

## 2020-01-17 DIAGNOSIS — Q631 Lobulated, fused and horseshoe kidney: Secondary | ICD-10-CM | POA: Diagnosis not present

## 2020-01-17 DIAGNOSIS — I129 Hypertensive chronic kidney disease with stage 1 through stage 4 chronic kidney disease, or unspecified chronic kidney disease: Secondary | ICD-10-CM | POA: Diagnosis not present

## 2020-01-17 DIAGNOSIS — D631 Anemia in chronic kidney disease: Secondary | ICD-10-CM | POA: Diagnosis not present

## 2020-01-17 DIAGNOSIS — N2581 Secondary hyperparathyroidism of renal origin: Secondary | ICD-10-CM | POA: Diagnosis not present

## 2020-01-17 DIAGNOSIS — N189 Chronic kidney disease, unspecified: Secondary | ICD-10-CM | POA: Diagnosis not present

## 2020-01-19 ENCOUNTER — Telehealth (HOSPITAL_COMMUNITY): Payer: Self-pay

## 2020-01-19 NOTE — Telephone Encounter (Signed)
Spoke with the patient, she stated that she would be here for her test. Asked to call back with any questions. S.Saige Canton EMTP ?

## 2020-01-20 ENCOUNTER — Inpatient Hospital Stay (HOSPITAL_COMMUNITY): Admission: RE | Admit: 2020-01-20 | Payer: Medicare Other | Source: Ambulatory Visit

## 2020-01-23 ENCOUNTER — Telehealth: Payer: Self-pay | Admitting: Interventional Cardiology

## 2020-01-23 NOTE — Telephone Encounter (Signed)
Spoke with Rolla Flatten AD of nursing.  She was able to locate patients COVID screen done Friday 01/20/20 at [facility]. The results are negative and she is having them faxed to our office at [phone].  I have left a message for Otho Perl the supervisor of the nuclear department as her stress is in the morning at 715am.  I have also called the patient and left her a voicemail that the results were negative and she should come in for her test tomorrow.

## 2020-01-23 NOTE — Telephone Encounter (Signed)
Patient is calling for her COVID results, as she has stress test in the morning.

## 2020-01-23 NOTE — Telephone Encounter (Signed)
Patient states that she went to get a COVID test on Friday 01/22 at 900AM. I do not see her results in her chart anywhere.

## 2020-01-24 ENCOUNTER — Ambulatory Visit (INDEPENDENT_AMBULATORY_CARE_PROVIDER_SITE_OTHER): Payer: Medicare Other | Admitting: Family Medicine

## 2020-01-24 ENCOUNTER — Other Ambulatory Visit: Payer: Self-pay

## 2020-01-24 ENCOUNTER — Ambulatory Visit (HOSPITAL_COMMUNITY): Payer: Medicare Other | Attending: Cardiovascular Disease

## 2020-01-24 DIAGNOSIS — R7303 Prediabetes: Secondary | ICD-10-CM | POA: Diagnosis not present

## 2020-01-24 DIAGNOSIS — R072 Precordial pain: Secondary | ICD-10-CM | POA: Insufficient documentation

## 2020-01-24 DIAGNOSIS — N183 Chronic kidney disease, stage 3 unspecified: Secondary | ICD-10-CM

## 2020-01-24 LAB — MYOCARDIAL PERFUSION IMAGING
Estimated workload: 7.2 METS
Exercise duration (min): 6 min
Exercise duration (sec): 0 s
LV dias vol: 61 mL (ref 46–106)
LV sys vol: 25 mL
MPHR: 154 {beats}/min
Peak HR: 136 {beats}/min
Percent HR: 88 %
Rest HR: 76 {beats}/min
SDS: 0
SRS: 0
SSS: 0
TID: 1.13

## 2020-01-24 MED ORDER — TECHNETIUM TC 99M TETROFOSMIN IV KIT
10.6000 | PACK | Freq: Once | INTRAVENOUS | Status: AC | PRN
  Administered 2020-01-24: 10.6 via INTRAVENOUS
  Filled 2020-01-24: qty 11

## 2020-01-24 MED ORDER — TECHNETIUM TC 99M TETROFOSMIN IV KIT
31.7000 | PACK | Freq: Once | INTRAVENOUS | Status: AC | PRN
  Administered 2020-01-24: 31.7 via INTRAVENOUS
  Filled 2020-01-24: qty 32

## 2020-01-24 NOTE — Patient Instructions (Addendum)
Congratulations on making some consistent, meaningful changes in your diet!  You will likely see an improvement in your hemoglobin A1C when you get it checked next - probably when you see Dr. Ardelia Mems in March.    Keep doing what you have been doing - distributing foods fairly evenly throughout the day, limiting starchy foods to 1 or 2 per meal, and making sure you balance your meals with protein, starch, and vegetables.  In addition, fruit and yogurt make excellent snacks.    As we discussed today, keep up the home food preparation, and take advantage of leftovers with cooking larger amounts on days you have the time.  And remember our discussion about how we think of our activities in terms of "get to" vs. "have to."   Lastly, remember the value of exercise in keeping [place] healthy and independent.  The  [facility] program is great, and I'd like to see you keep doing that a few times a week, but as the weather improves, walking outside will be great for other days as well.    Call any time if you have questions related to your nutrition:  [phone].  Dr. Jenne Campus

## 2020-01-24 NOTE — Progress Notes (Signed)
Telehealth Encounter PCP Chrisandra Netters, MD I connected with Jennifer Moss (MRN [id number]) on 01/24/2020 by telephone, verified that I am speaking with the correct person using two identifiers, and that the patient was in a private environment conducive to confidentiality.  The patient agreed to proceed.  Provider was Kennith Center, PhD, RD, LDN, CEDRD Provider(s) located at [facility] during this telehealth encounter; patient was at home  Appt start time: 1330 end time: [id number] (45 minutes)  Reason for telehealth visit: Referred by Dr. Ardelia Mems for Medical Nutrition Therapy for CKD Stage 3 (N18.30) and prediabetes (R73.03).   Relevant history/background: [name] has a h/o CKD, HTN, HLD, diverticulosis, and constipation.  Her BMI is 29 with correct height of 60" (weight 152 lb on 10/30).  [name] is interested in preserving renal function, preventing DM, and losing weight.  Assessment: [name] said she "just can't eat 3 meals a day."  She has, however, managed to eat something at least 3 X day, and has been careful to make healthy choices.  Remains highly motivated to avoid DM.   Progress on goals documented on Goals Sheet:   Even food distribution (>3 meals, 1 snk): 2 meals and 2 snacks per day (lunch is usually small, like yogurt + fruit).   Limit starches to 1-2/meal: Limiting to one starch per meal most days. Balanced meal 3 X day: Achieving most days. 24-hr recall:  (Up at 5 AM) B (8:30 AM)-  1 roast beef sandwich, 2 tsp mayo, lettuce, 1 c coffee, 3 Equals, 2 tsp creamer Snk ( AM)-  --- L (2 PM)-  1 salad, 1 tbsp dressing, 2 tbsp blackeyed peas, 2 1/2 tbsp cooked greens, lemon water Snk ( PM)-  --- D (7 PM)-  5 oz yogurt (120 kcal), 1/2 banana, water Snk ( PM)-  --- Typical day? Yes.    Intervention: Reviewed progress on behavioral goals from 11/2919.  Encouraged patient to continue to do weekly food prep, which she said is challenging b/c of cooking just  for herself.    For recommendations and goals, see Patient Instructions.    Follow-up: Patient will call if she feels she needs F/U, or if she has Qs.    Rayden Dock,JEANNIE

## 2020-04-19 ENCOUNTER — Ambulatory Visit (INDEPENDENT_AMBULATORY_CARE_PROVIDER_SITE_OTHER): Payer: Medicare Other | Admitting: Family Medicine

## 2020-04-19 ENCOUNTER — Emergency Department (HOSPITAL_COMMUNITY)
Admission: EM | Admit: 2020-04-19 | Discharge: 2020-04-19 | Disposition: A | Discharge status: Home / Self Care | Payer: Medicare Other | Attending: Emergency Medicine | Admitting: Emergency Medicine

## 2020-04-19 ENCOUNTER — Emergency Department (HOSPITAL_COMMUNITY): Payer: Medicare Other

## 2020-04-19 ENCOUNTER — Encounter (HOSPITAL_COMMUNITY): Payer: Self-pay

## 2020-04-19 ENCOUNTER — Other Ambulatory Visit: Payer: Self-pay

## 2020-04-19 VITALS — BP 111/79 | HR 75 | Ht 60.0 in | Wt 146.8 lb

## 2020-04-19 DIAGNOSIS — Z79899 Other long term (current) drug therapy: Secondary | ICD-10-CM | POA: Diagnosis not present

## 2020-04-19 DIAGNOSIS — I129 Hypertensive chronic kidney disease with stage 1 through stage 4 chronic kidney disease, or unspecified chronic kidney disease: Secondary | ICD-10-CM | POA: Insufficient documentation

## 2020-04-19 DIAGNOSIS — R1084 Generalized abdominal pain: Secondary | ICD-10-CM | POA: Diagnosis not present

## 2020-04-19 DIAGNOSIS — K921 Melena: Secondary | ICD-10-CM

## 2020-04-19 DIAGNOSIS — N183 Chronic kidney disease, stage 3 unspecified: Secondary | ICD-10-CM | POA: Insufficient documentation

## 2020-04-19 DIAGNOSIS — Z7982 Long term (current) use of aspirin: Secondary | ICD-10-CM | POA: Insufficient documentation

## 2020-04-19 DIAGNOSIS — K5730 Diverticulosis of large intestine without perforation or abscess without bleeding: Secondary | ICD-10-CM | POA: Diagnosis not present

## 2020-04-19 LAB — CBC
HCT: 51.4 % — ABNORMAL HIGH (ref 36.0–46.0)
Hemoglobin: 15.8 g/dL — ABNORMAL HIGH (ref 12.0–15.0)
MCH: 27.5 pg (ref 26.0–34.0)
MCHC: 30.7 g/dL (ref 30.0–36.0)
MCV: 89.4 fL (ref 80.0–100.0)
Platelets: 359 10*3/uL (ref 150–400)
RBC: 5.75 MIL/uL — ABNORMAL HIGH (ref 3.87–5.11)
RDW: 14.9 % (ref 11.5–15.5)
WBC: 9.2 10*3/uL (ref 4.0–10.5)
nRBC: 0 % (ref 0.0–0.2)

## 2020-04-19 LAB — URINALYSIS, ROUTINE W REFLEX MICROSCOPIC
Bilirubin Urine: NEGATIVE
Glucose, UA: NEGATIVE mg/dL
Hgb urine dipstick: NEGATIVE
Ketones, ur: NEGATIVE mg/dL
Leukocytes,Ua: NEGATIVE
Nitrite: NEGATIVE
Protein, ur: 100 mg/dL — AB
Specific Gravity, Urine: 1.021 (ref 1.005–1.030)
pH: 5 (ref 5.0–8.0)

## 2020-04-19 LAB — LIPASE, BLOOD: Lipase: 34 U/L (ref 11–51)

## 2020-04-19 LAB — COMPREHENSIVE METABOLIC PANEL
ALT: 15 U/L (ref 0–44)
AST: 22 U/L (ref 15–41)
Albumin: 4.3 g/dL (ref 3.5–5.0)
Alkaline Phosphatase: 105 U/L (ref 38–126)
Anion gap: 13 (ref 5–15)
BUN: 22 mg/dL (ref 8–23)
CO2: 17 mmol/L — ABNORMAL LOW (ref 22–32)
Calcium: 9.2 mg/dL (ref 8.9–10.3)
Chloride: 108 mmol/L (ref 98–111)
Creatinine, Ser: 2.18 mg/dL — ABNORMAL HIGH (ref 0.44–1.00)
GFR calc Af Amer: 26 mL/min — ABNORMAL LOW (ref >60)
GFR calc non Af Amer: 23 mL/min — ABNORMAL LOW (ref >60)
Glucose, Bld: 122 mg/dL — ABNORMAL HIGH (ref 70–99)
Potassium: 4.7 mmol/L (ref 3.5–5.1)
Sodium: 138 mmol/L (ref 135–145)
Total Bilirubin: 0.2 mg/dL — ABNORMAL LOW (ref 0.3–1.2)
Total Protein: 7.1 g/dL (ref 6.5–8.1)

## 2020-04-19 LAB — POCT HEMOGLOBIN: Hemoglobin: 15.6 g/dL — AB (ref 11–14.6)

## 2020-04-19 LAB — POC OCCULT BLOOD, ED: Fecal Occult Bld: NEGATIVE

## 2020-04-19 MED ORDER — IOHEXOL 9 MG/ML PO SOLN
ORAL | Status: AC
  Filled 2020-04-19: qty 500

## 2020-04-19 MED ORDER — SODIUM CHLORIDE 0.9% FLUSH: 3.0000 mL | Freq: Once | INTRAVENOUS | Status: DC

## 2020-04-19 MED ORDER — SODIUM CHLORIDE 0.9 % IV BOLUS
1000.0000 mL | Freq: Once | INTRAVENOUS | Status: AC
  Administered 2020-04-19: 1000 mL via INTRAVENOUS

## 2020-04-19 MED ORDER — DICYCLOMINE HCL 10 MG PO CAPS: 10.0000 mg | ORAL_CAPSULE | Freq: Once | ORAL | Status: DC

## 2020-04-19 MED ORDER — DICYCLOMINE HCL 20 MG PO TABS: 20.0000 mg | ORAL_TABLET | Freq: Three times a day (TID) | ORAL | 0 refills | Status: DC

## 2020-04-19 NOTE — ED Triage Notes (Signed)
Pt sent by PCP for BUQ abdominal pain/flank pain x 2 weeks. Pt endorses nausea, denies v/d. Pt states she has had kidney stone in the past but this feels different. Pt reports 4/10 pain.

## 2020-04-19 NOTE — ED Provider Notes (Signed)
[facility] EMERGENCY DEPARTMENT Provider Note   CSN: [id number] Arrival date & time: 04/19/20  1145     History Chief Complaint  Patient presents with   Abdominal Pain    Jennifer Moss is a 69 y.o. female.  HPI She presents for evaluation of cramp-like upper abdominal pain, bilateral, present for 2 weeks.  For the last day she has noticed some blood in her stool.  The blood was both "light and dark."  She has not eaten well for a week and a half and had occasional nausea but is not vomiting.  She denies fever, chills, chest pain, back pain, dysuria, urinary frequency, hematuria.  She denies constipation and states she takes lactulose and stool softeners, on alternate days.  She occasionally coughs and gets up white-colored mucus.  She does not have ongoing shortness of breath.  When she has the pain it comes on quickly and lasts for 3 to 4 minutes and improves spontaneously, but continues as a dull ache, which has been present for 2 weeks.  No similar problems in the past.  There are no other known modifying factors.    Past Medical History:  Diagnosis Date   Anginal pain ([place])    Arthritis    Chronic kidney disease    rt kidney horseshoe   Diverticulitis    Drug abuse ([place])    Hyperlipemia    Hypertension    Mallory-Weiss tear    Muscle spasms of both lower extremities    Rectal bleed    Retinal detachment    R eye, per patient   Shortness of breath     Patient Active Problem List   Diagnosis Date Noted   Overweight 10/28/2019   Headache 07/12/2018   Hirsutism 08/28/2017   Gingival enlargement 04/07/2016   Special screening for malignant neoplasms, colon    Rib pain 02/24/2015   BRBPR (bright red blood per rectum) 04/26/2014   Diverticulosis 04/26/2014   Preventative health care 04/26/2014   Hyperlipidemia 11/28/2013   Asthma 04/03/2013   Neurofibromatosis, 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   Constipation 01/28/2013   Routine  health maintenance 01/28/2013   Injury of left shoulder 01/19/2013   Chest pain 01/12/2013   Mass of soft tissue 01/12/2013   Prolonged Q-T interval on ECG 01/11/2013   Leg pain 01/11/2013   Mallory-Weiss tear 12/23/2012   Rectal bleeding 12/23/2012   Hypertension 12/23/2012   Drug abuse (including cocaine) 12/23/2012   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   Chronic kidney disease, stage 3 ([place]) 12/23/2012    Past Surgical History:  Procedure Laterality Date   APPENDECTOMY     COLONOSCOPY N/A 04/11/2015   Procedure: COLONOSCOPY;  Surgeon: Danie Binder, MD;  Location: [facility]O [facility];  Service: Endoscopy;  Laterality: N/A;  8:30 AM   DIAGNOSTIC LAPAROSCOPY     for horseshoe kidney   ESOPHAGOGASTRODUODENOSCOPY  12/17/2012   Procedure: ESOPHAGOGASTRODUODENOSCOPY (EGD);  Surgeon: Wonda Horner, MD;  Location[facility] ENDOSCOPY;  Service: Endoscopy;  Laterality: N/A;   Lipoma removal     in 1990s had an egg-sized tumor removed from her leg, she thinks it was a lipoma   repair of detached retina     TUBAL LIGATION       OB History    Gravida  1   Para  1   Term      Preterm      AB      Living  1     SAB  TAB      Ectopic      Multiple      Live Births  1           Family History  Problem Relation Age of Onset   Coronary artery disease Father    Heart attack Father    Asthma Father    Hypertension Mother    Asthma Mother    Coronary artery disease Maternal Grandmother    Coronary artery disease Maternal Uncle    Diabetes Sister    Diabetes Paternal Grandmother    Kidney disease Paternal Grandmother    Colon cancer Neg Hx    Breast cancer Neg Hx     Social History   Tobacco Use   Smoking status: Never Smoker   Smokeless tobacco: Never Used  Substance Use Topics   Alcohol use: Yes    Alcohol/week: 0.0 standard drinks    Comment: occassional drink at holidays   Drug use: No    Frequency: 3.0 times per week    Types:  Marijuana, Cocaine    Comment: cocaine and THC + in UDS in November 2013. Last used 1 year ago as of 04/11/15    Home Medications Prior to Admission medications   Medication Sig Start Date End Date Taking? Authorizing Provider  acetaminophen (TYLENOL) 500 MG tablet Take 1,000 mg by mouth See admin instructions. Take 1,000 mg by mouth every six to eight hours as needed for pain or headaches   Yes [provider]  amLODipine (NORVASC) 10 MG tablet Take 1 tablet by mouth once daily Patient taking differently: Take 10 mg by mouth at bedtime.  01/02/20  Yes Leeanne Rio, MD  aspirin EC 81 MG EC tablet Take 1 tablet (81 mg total) by mouth daily. Patient taking differently: Take 81 mg by mouth in the morning.  01/19/13  Yes Angelica Ran, MD  lactulose (CHRONULAC) 10 GM/15ML solution Take 15 mLs (10 g total) by mouth every other day. 12/20/19  Yes Leeanne Rio, MD  lisinopril (ZESTRIL) 20 MG tablet Take 1 tablet (20 mg total) by mouth daily. Patient taking differently: Take 20 mg by mouth at bedtime.  04/27/19  Yes Leeanne Rio, MD  Omega-3 Fatty Acids (FISH OIL PO) Take 1 capsule by mouth in the morning.    Yes [provider]  dicyclomine (BENTYL) 20 MG tablet Take 1 tablet (20 mg total) by mouth 4 (four) times daily -  before meals and at bedtime. 04/19/20   Daleen Bo, MD    Allergies    Patient has no known allergies.  Review of Systems   Review of Systems  All other systems reviewed and are negative.   Physical Exam Updated Vital Signs BP 122/89    Pulse 82    Temp 98.3 F (36.8 C) (Oral)    Resp 16    Ht 5' (1.524 m)    Wt 66.2 kg    SpO2 99%    BMI 28.51 kg/m   Physical Exam Vitals and nursing note reviewed.  Constitutional:      General: She is not in acute distress.    Appearance: She is well-developed. She is not ill-appearing, toxic-appearing or diaphoretic.  HENT:     Head: Normocephalic and atraumatic.     Right Ear:  External ear normal.     Left Ear: External ear normal.  Eyes:     Conjunctiva/sclera: Conjunctivae normal.     Pupils: Pupils are equal, round, and  reactive to light.  Neck:     Trachea: Phonation normal.  Cardiovascular:     Rate and Rhythm: Normal rate and regular rhythm.     Heart sounds: Normal heart sounds.  Pulmonary:     Effort: Pulmonary effort is normal. No respiratory distress.     Breath sounds: Normal breath sounds. No stridor. No rhonchi.  Abdominal:     General: There is no distension.     Palpations: Abdomen is soft.     Tenderness: There is no abdominal tenderness.  Genitourinary:    Comments: Normal anus.  Small amount of firm brown stool in the rectal vault.  Stool sent for Hemoccult testing.  No rectal mass or hemorrhoids. Musculoskeletal:        General: No swelling or tenderness. Normal range of motion.     Cervical back: Normal range of motion and neck supple.  Skin:    General: Skin is warm and dry.     Coloration: Skin is not jaundiced or pale.  Neurological:     Mental Status: She is alert and oriented to person, place, and time.     Cranial Nerves: No cranial nerve deficit.     Sensory: No sensory deficit.     Motor: No abnormal muscle tone.     Coordination: Coordination normal.  Psychiatric:        Mood and Affect: Mood normal.        Behavior: Behavior normal.        Thought Content: Thought content normal.        Judgment: Judgment normal.     ED Results / Procedures / Treatments   Labs (all labs ordered are listed, but only abnormal results are displayed) Labs Reviewed  COMPREHENSIVE METABOLIC PANEL - Abnormal; Notable for the following components:      Result Value   CO2 17 (*)    Glucose, Bld 122 (*)    Creatinine, Ser 2.18 (*)    Total Bilirubin 0.2 (*)    GFR calc non Af Amer 23 (*)    GFR calc Af Amer 26 (*)    All other components within normal limits  CBC - Abnormal; Notable for the following components:   RBC 5.75 (*)     Hemoglobin 15.8 (*)    HCT 51.4 (*)    All other components within normal limits  URINALYSIS, ROUTINE W REFLEX MICROSCOPIC - Abnormal; Notable for the following components:   APPearance HAZY (*)    Protein, ur 100 (*)    Bacteria, UA FEW (*)    All other components within normal limits  LIPASE, BLOOD  POC OCCULT BLOOD, ED    EKG None  Radiology CT ABDOMEN PELVIS WO CONTRAST  Result Date: 04/19/2020 CLINICAL DATA:  Upper abdominal pain EXAM: CT ABDOMEN AND PELVIS WITHOUT CONTRAST TECHNIQUE: Multidetector CT imaging of the abdomen and pelvis was performed following the standard protocol without IV contrast. COMPARISON:  06/25/2015 FINDINGS: LOWER CHEST: Normal. HEPATOBILIARY: Normal hepatic contours. No intra- or extrahepatic biliary dilatation. The gallbladder is normal. PANCREAS: Normal pancreas. No ductal dilatation or peripancreatic fluid collection. SPLEEN: Normal. ADRENALS/URINARY TRACT: The adrenal glands are normal. 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 No hydronephrosis, nephroureterolithiasis or solid renal mass. The urinary bladder is normal for degree of distention STOMACH/BOWEL: There is no hiatal hernia. Normal duodenal course and caliber. No small bowel dilatation or inflammation. Diffuse colonic diverticulosis without acute inflammation. Normal appendix. VASCULAR/LYMPHATIC: Normal course and caliber of the major abdominal  vessels. No abdominal or pelvic lymphadenopathy. REPRODUCTIVE: Normal uterus and ovaries. MUSCULOSKELETAL. No bony spinal canal stenosis or focal osseous abnormality. OTHER: None. IMPRESSION: 1. No acute abnormality of the abdomen or pelvis. 2. Diffuse colonic diverticulosis without acute inflammation. Electronically Signed   By: Ulyses Jarred M.D.   On: 04/19/2020 20:30    Procedures Procedures (including critical care time)  Medications Ordered in ED Medications  sodium chloride flush (NS) 0.9 % injection 3 mL (3 mLs Intravenous Not Given 04/19/20  1552)  iohexol (OMNIPAQUE) 9 MG/ML oral solution (has no administration in time range)  dicyclomine (BENTYL) capsule 10 mg (has no administration in time range)  sodium chloride 0.9 % bolus 1,000 mL (0 mLs Intravenous Stopped 04/19/20 2207)    ED Course  I have reviewed the triage vital signs and the nursing notes.  Pertinent labs & imaging results that were available during my care of the patient were reviewed by me and considered in my medical decision making (see chart for details).  Clinical Course as of Apr 19 2214  Thu Apr 19, 2020  1610 Urinalysis, Routine w reflex microscopic [EW]    Clinical Course User Index [EW] Daleen Bo, MD   MDM Rules/Calculators/A&P                       Patient Vitals for the past 24 hrs:  BP Temp Temp src Pulse Resp SpO2 Height Weight  04/19/20 2030 122/89 -- -- 82 16 99 % -- --  04/19/20 1900 123/90 -- -- (!) 56 14 99 % -- --  04/19/20 1815 (!) 119/92 -- -- 72 17 94 % -- --  04/19/20 1730 112/86 -- -- 73 15 97 % -- --  04/19/20 1700 (!) 120/93 -- -- (!) 59 13 99 % -- --  04/19/20 1615 116/83 -- -- 63 15 100 % -- --  04/19/20 1151 -- -- -- -- -- -- 5' (1.524 m) 66.2 kg  04/19/20 1150 113/80 98.3 F (36.8 C) Oral 72 12 99 % -- --    10:02 PM Reevaluation with update and discussion. After initial assessment and treatment, an updated evaluation reveals she states the pain is going away.  Findings discussed and questions answered. Daleen Bo   Medical Decision Making:  This patient is presenting for evaluation of ongoing upper abdominal pain for 2 weeks.  Onset rectal bleeding yesterday., which does require a range of treatment options, and is a complaint that involves a moderate risk of morbidity and mortality. The differential diagnoses include constipation, gallbladder disease, colitis. I decided  to review old records, and in summary elderly female, with history of horseshoe kidney, chronic kidney disease, hypertension and prior rectal  bleeding.. I did not require additional historical information from anyone. Clinical Laboratory Tests Ordered, included CBC, see note, Hemoccult stool.  Reviewed, with abnormals being CO2 low, and creatinine somewhat elevated, slightly greater than baseline.  Hemoglobin elevated.  These results are consistent with volume depletion/dehydration. Radiologic Tests Ordered, included CT abdomen pelvis. I independently Visualized: CT images, which show normal intestinal pattern without significant constipation; Cardiac Monitor Tracing which shows normal sinus rhythm; Specimen off stool indicating brown color.  Critical Interventions-clinical exam, laboratory testing, radiologic imaging.  After These Interventions, the Patient was reevaluated and was found improved, more comfortable.  No indication for further ED evaluation or hospitalization.  Suspect intestinal cramping causing her discomfort.  Possible mild constipation which could improve by increasing lactulose to daily.  CRITICAL CARE-no Performed  by: Daleen Bo   Nursing Notes Reviewed/ Care Coordinated Applicable Imaging Reviewed Interpretation of Laboratory Data incorporated into ED treatment  The patient appears reasonably screened and/or stabilized for discharge and I doubt any other medical condition or other [facility] requiring further screening, evaluation, or treatment in the ED at this time prior to discharge.  Plan: Home Medications-continue current; Home Treatments-increase fiber in diet; return here if the recommended treatment, does not improve the symptoms; Recommended follow up-PCP follow-up 1 week.\    Final Clinical Impression(s) / ED Diagnoses Final diagnoses:  Generalized abdominal pain    Rx / DC Orders ED Discharge Orders         Ordered    dicyclomine (BENTYL) 20 MG tablet  3 times daily before meals & bedtime     04/19/20 2210           Daleen Bo, MD 04/19/20 2215

## 2020-04-19 NOTE — ED Notes (Signed)
Patient verbalizes understanding of discharge instructions. Opportunity for questioning and answers were provided. Armband removed by staff, pt discharged from ED and left via wheelchair to family.

## 2020-04-19 NOTE — Progress Notes (Signed)
  Date of Visit: 04/19/2020   SUBJECTIVE:   HPI:  Jennifer Moss presents today for routine follow up, but has an acute issue going on that we focused on during the entirety of today's visit.  On 4/11 woke up having abdominal pain and nausea. Vomited that day several times. Vomiting stopped later in the day, but abdominal pain has persisted. Having cramping feeling in upper abdomen, very troublesome to patient. Very decreased appetite, has lost some weight during this time. Twice had red blood on toilet paper when she wiped. Then yesterday began having black stools, happened twice. Does admit to feeling dizzy, got lightheaded just a bit ago in the clinic here. No fevers. Mild shortness of breath which is chronic but maybe a little worse lately. Tolerating intake of liquids.   OBJECTIVE:   BP 111/79   Pulse 75   Ht 5' (1.524 m)   Wt 146 lb 12.8 oz (66.6 kg)   SpO2 98%   BMI 28.67 kg/m  Gen: no acute distress, appears as though does not feel well HEENT: normocephalic, atraumatic, moist mucous membranes  Heart: regular rate and rhythm, no murmur Lungs: clear to auscultation bilaterally, normal work of breathing Abdomen: normoactive bowel sounds. Soft. +tender to palpation in entirety of upper abdomen, epigastric, LUQ and RUQ. No peritoneal signs. No masses Back: muscular tenderness of R flank, no deep CVA tenderness appreciated Neuro: alert, speech normal, grossly nonfocal Ext: No appreciable lower extremity edema bilaterally   ASSESSMENT/PLAN:   Health maintenance:  -received both doses of COVID vaccine -had planned to address colonoscopy today, will defer to future visit (or may get this inpatient)   Abdominal pain Presents with abdominal pain x11 days, accompanied by new black stools, as well as red blood in stool. With some systemic symptoms of dizziness as well. Warrants prompt evaluation given history of mallory weiss tear and diverticulitis. POC hgb obtained and is 15.6, which is at  patient's baseline. Discussed options for evaluation including arranging labs, CT abdomen/pelvis, and GI referral outpatient, or could proceed with ED visit to consolidate and expedite care given these acute urgent complaints. Patient opted for ED visit. Would recommend CMET, CBC, lipase and abdominal CT at a minimum. May need GI consultation. Caution with IV contrast given CKD and horseshoe kidney.  Called and spoke with charge RN and notified them that patient was coming. She was wheeled to the ED by [place].  [place]. Ardelia Mems, [place] than 30 minutes were spent on this encounter on the day of service, including pre-visit planning, actual face to face time, coordination of care, and documentation of visit.

## 2020-04-19 NOTE — Patient Instructions (Addendum)
Please go to the ER to be evaluated for your abdominal pain, poor appetite, bloody stools, and black stools  I will let them know you're coming  Dr. Ardelia Mems

## 2020-04-19 NOTE — Discharge Instructions (Addendum)
Increase your lactulose to every day, and drink plenty of fluids and increase the fiber in your diet.  We are giving you a prescription for medicine to help the cramping.  See your doctor for checkup next week.

## 2020-05-09 ENCOUNTER — Emergency Department (HOSPITAL_COMMUNITY): Payer: Medicare Other

## 2020-05-09 ENCOUNTER — Other Ambulatory Visit: Payer: Self-pay

## 2020-05-09 ENCOUNTER — Emergency Department (HOSPITAL_COMMUNITY)
Admission: EM | Admit: 2020-05-09 | Discharge: 2020-05-09 | Disposition: A | Discharge status: Home / Self Care | Payer: Medicare Other | Attending: Emergency Medicine | Admitting: Emergency Medicine

## 2020-05-09 ENCOUNTER — Encounter (HOSPITAL_COMMUNITY): Payer: Self-pay | Admitting: Emergency Medicine

## 2020-05-09 DIAGNOSIS — Z7982 Long term (current) use of aspirin: Secondary | ICD-10-CM | POA: Insufficient documentation

## 2020-05-09 DIAGNOSIS — Z79899 Other long term (current) drug therapy: Secondary | ICD-10-CM | POA: Diagnosis not present

## 2020-05-09 DIAGNOSIS — I10 Essential (primary) hypertension: Secondary | ICD-10-CM | POA: Diagnosis not present

## 2020-05-09 DIAGNOSIS — Y999 Unspecified external cause status: Secondary | ICD-10-CM | POA: Diagnosis not present

## 2020-05-09 DIAGNOSIS — W19XXXA Unspecified fall, initial encounter: Secondary | ICD-10-CM | POA: Insufficient documentation

## 2020-05-09 DIAGNOSIS — M25532 Pain in left wrist: Secondary | ICD-10-CM | POA: Diagnosis not present

## 2020-05-09 DIAGNOSIS — S5012XA Contusion of left forearm, initial encounter: Secondary | ICD-10-CM | POA: Diagnosis not present

## 2020-05-09 DIAGNOSIS — S6992XA Unspecified injury of left wrist, hand and finger(s), initial encounter: Secondary | ICD-10-CM | POA: Diagnosis not present

## 2020-05-09 DIAGNOSIS — S59912A Unspecified injury of left forearm, initial encounter: Secondary | ICD-10-CM | POA: Diagnosis not present

## 2020-05-09 DIAGNOSIS — Y9289 Other specified places as the place of occurrence of the external cause: Secondary | ICD-10-CM | POA: Diagnosis not present

## 2020-05-09 DIAGNOSIS — Y9389 Activity, other specified: Secondary | ICD-10-CM | POA: Insufficient documentation

## 2020-05-09 NOTE — ED Triage Notes (Signed)
C/C fall last night while pumping gas, patient fell onto her L arm around 7pm. Able to move extremities freely, mild swelling noted to L wrist.

## 2020-05-09 NOTE — ED Provider Notes (Signed)
[facility] DEPT Provider Note   CSN: [id number] Arrival date & time: 05/09/20  [id number]     History Chief Complaint  Patient presents with  . Arm Injury  . Fall    Jennifer Moss is a 69 y.o. female.  69 year old female here complaining of left wrist and left forearm pain after mechanical fall yesterday.  Denies any head or neck trauma from this.  Complains of sharp pain worse with movement.  Denies any distal numbness or tingling to her left hand.  Pain worse with movement and better with remaining still no treatment use prior to arrival        Past Medical History:  Diagnosis Date  . Anginal pain ([place])   . Arthritis   . Chronic kidney disease    rt kidney horseshoe  . Diverticulitis   . Drug abuse ([place])   . Hyperlipemia   . Hypertension   . Mallory-Weiss tear   . Muscle spasms of both lower extremities   . Rectal bleed   . Retinal detachment    R eye, per patient  . Shortness of breath     Patient Active Problem List   Diagnosis Date Noted  . Overweight 10/28/2019  . Headache 07/12/2018  . Hirsutism 08/28/2017  . Gingival enlargement 04/07/2016  . Special screening for malignant neoplasms, colon   . Rib pain 02/24/2015  . BRBPR (bright red blood per rectum) 04/26/2014  . Diverticulosis 04/26/2014  . Preventative health care 04/26/2014  . Hyperlipidemia 11/28/2013  . Asthma 04/03/2013  . 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 Constipation 01/28/2013  . Routine health maintenance 01/28/2013  . Injury of left shoulder 01/19/2013  . Chest pain 01/12/2013  . Mass of soft tissue 01/12/2013  . Prolonged Q-T interval on ECG 01/11/2013  . Leg pain 01/11/2013  . Mallory-Weiss tear 12/23/2012  . Rectal bleeding 12/23/2012  . Hypertension 12/23/2012  . Drug abuse (including cocaine) 12/23/2012  . 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 Chronic kidney disease, stage 3 ([place]) 12/23/2012    Past Surgical History:  Procedure Laterality  Date  . APPENDECTOMY    . COLONOSCOPY N/A 04/11/2015   Procedure: COLONOSCOPY;  Surgeon: Danie Binder, MD;  [facility];  Service: Endoscopy;  Laterality: N/A;  8:30 AM  . DIAGNOSTIC LAPAROSCOPY     for horseshoe kidney  . ESOPHAGOGASTRODUODENOSCOPY  12/17/2012   Procedure: ESOPHAGOGASTRODUODENOSCOPY (EGD);  Surgeon: Wonda Horner, MD;  Location: [facility] ENDOSCOPY;  Service: Endoscopy;  Laterality: N/A;  . Lipoma removal     in 1990s had an egg-sized tumor removed from her leg, she thinks it was a lipoma  . repair of detached retina    . TUBAL LIGATION       OB History    Gravida  1   Para  1   Term      Preterm      AB      Living  1     SAB      TAB      Ectopic      Multiple      Live Births  1           Family History  Problem Relation Age of Onset  . Coronary artery disease Father   . Heart attack Father   . Asthma Father   . Hypertension Mother   . Asthma Mother   . Coronary artery disease Maternal Grandmother   . Coronary artery disease  Maternal Uncle   . Diabetes Sister   . Diabetes Paternal Grandmother   . Kidney disease Paternal Grandmother   . Colon cancer Neg Hx   . Breast cancer Neg Hx     Social History   Tobacco Use  . Smoking status: Never Smoker  . Smokeless tobacco: Never Used  Substance Use Topics  . Alcohol use: Yes    Alcohol/week: 0.0 standard drinks    Comment: occassional drink at holidays  . Drug use: No    Frequency: 3.0 times per week    Types: Marijuana, Cocaine    Comment: cocaine and THC + in UDS in November 2013. Last used 1 year ago as of 04/11/15    Home Medications Prior to Admission medications   Medication Sig Start Date End Date Taking? Authorizing Provider  acetaminophen (TYLENOL) 500 MG tablet Take 1,000 mg by mouth every 6 (six) hours as needed for mild pain or headache. Take 1,000 mg by mouth every six to eight hours as needed for pain or headaches   Yes [provider]  amLODipine  (NORVASC) 10 MG tablet Take 1 tablet by mouth once daily Patient taking differently: Take 10 mg by mouth at bedtime.  01/02/20  Yes Leeanne Rio, MD  aspirin EC 81 MG EC tablet Take 1 tablet (81 mg total) by mouth daily. Patient taking differently: Take 81 mg by mouth in the morning.  01/19/13  Yes Angelica Ran, MD  lisinopril (ZESTRIL) 20 MG tablet Take 1 tablet (20 mg total) by mouth daily. Patient taking differently: Take 20 mg by mouth at bedtime.  04/27/19  Yes Leeanne Rio, MD  Omega-3 Fatty Acids (FISH OIL PO) Take 1 capsule by mouth in the morning.    Yes [provider]  dicyclomine (BENTYL) 20 MG tablet Take 1 tablet (20 mg total) by mouth 4 (four) times daily -  before meals and at bedtime. Patient not taking: Reported on 05/09/2020 04/19/20   Daleen Bo, MD  lactulose (CHRONULAC) 10 GM/15ML solution Take 15 mLs (10 g total) by mouth every other day. Patient not taking: Reported on 05/09/2020 12/20/19   Leeanne Rio, MD    Allergies    Patient has no known allergies.  Review of Systems   Review of Systems  All other systems reviewed and are negative.   Physical Exam Updated Vital Signs BP 105/79   Pulse 79   Temp 98.4 F (36.9 C) (Oral)   Resp 15   Ht 1.524 m (5')   Wt 67 kg   SpO2 95%   BMI 28.85 kg/m   Physical Exam Vitals and nursing note reviewed.  Constitutional:      General: She is not in acute distress.    Appearance: Normal appearance. She is well-developed. She is not toxic-appearing.  HENT:     Head: Normocephalic and atraumatic.  Eyes:     General: Lids are normal.     Conjunctiva/sclera: Conjunctivae normal.     Pupils: Pupils are equal, round, and reactive to light.  Neck:     Thyroid: No thyroid mass.     Trachea: No tracheal deviation.  Cardiovascular:     Rate and Rhythm: Normal rate and regular rhythm.     Heart sounds: Normal heart sounds. No murmur. No gallop.   Pulmonary:     Effort: Pulmonary  effort is normal. No respiratory distress.     Breath sounds: Normal breath sounds. No stridor. No decreased breath sounds, wheezing,  rhonchi or rales.  Abdominal:     General: Bowel sounds are normal. There is no distension.     Palpations: Abdomen is soft.     Tenderness: There is no abdominal tenderness. There is no rebound.  Musculoskeletal:        General: Normal range of motion.     Left forearm: Tenderness and bony tenderness present. No swelling or deformity.     Left wrist: Bony tenderness present. No deformity.     Cervical back: Normal range of motion and neck supple.  Skin:    General: Skin is warm and dry.     Findings: No abrasion or rash.  Neurological:     Mental Status: She is alert and oriented to person, place, and time.     GCS: GCS eye subscore is 4. GCS verbal subscore is 5. GCS motor subscore is 6.     Cranial Nerves: No cranial nerve deficit.     Sensory: No sensory deficit.  Psychiatric:        Speech: Speech normal.        Behavior: Behavior normal.     ED Results / Procedures / Treatments   Labs (all labs ordered are listed, but only abnormal results are displayed) Labs Reviewed - No data to display  EKG None  Radiology No results found.  Procedures Procedures (including critical care time)  Medications Ordered in ED Medications - No data to display  ED Course  I have reviewed the triage vital signs and the nursing notes.  Pertinent labs & imaging results that were available during my care of the patient were reviewed by me and considered in my medical decision making (see chart for details).    MDM Rules/Calculators/A&P                      X-ray of forearm and wrist negative.  Will give Velcro wrist splint for comfort.  Will refer to orthopedics Final Clinical Impression(s) / ED Diagnoses Final diagnoses:  None    Rx / DC Orders ED Discharge Orders    None       Lacretia Leigh, MD 05/09/20 1024

## 2020-05-10 NOTE — Progress Notes (Signed)
Orthopedic Tech Progress Note Patient Details:  Jennifer Moss [birth year] [id number] Charting for night shift Ortho Devices Type of Ortho Device: Thumb velcro splint Ortho Device/Splint Interventions: Ordered      Patient ID: Jennifer Moss, female   DOB: 06-May-1951, 69 y.o.   MRN: [id number]   Staci Righter 05/10/2020, 7:59 AM

## 2020-05-10 NOTE — Progress Notes (Deleted)
Orthopedic Tech Progress Note Patient Details:  Jennifer Moss 1951-09-19 [id number] Charting for night shift Ortho Devices Type of Ortho Device: ASO Ortho Device/Splint Interventions: Ordered      Patient ID: Jennifer Moss, female   DOB: Jan 01, 1951, 68 y.o.   MRN: [id number]   Staci Righter 05/10/2020, 7:50 AM

## 2020-05-15 ENCOUNTER — Ambulatory Visit (INDEPENDENT_AMBULATORY_CARE_PROVIDER_SITE_OTHER): Payer: Medicare Other | Admitting: Family Medicine

## 2020-05-15 ENCOUNTER — Other Ambulatory Visit: Payer: Self-pay

## 2020-05-15 VITALS — BP 110/80 | HR 83 | Ht 60.0 in | Wt 148.2 lb

## 2020-05-15 DIAGNOSIS — K5900 Constipation, unspecified: Secondary | ICD-10-CM | POA: Diagnosis not present

## 2020-05-15 DIAGNOSIS — I10 Essential (primary) hypertension: Secondary | ICD-10-CM

## 2020-05-15 DIAGNOSIS — N183 Chronic kidney disease, stage 3 unspecified: Secondary | ICD-10-CM

## 2020-05-15 DIAGNOSIS — Z1211 Encounter for screening for malignant neoplasm of colon: Secondary | ICD-10-CM

## 2020-05-15 MED ORDER — LISINOPRIL 20 MG PO TABS: 20.0000 mg | ORAL_TABLET | Freq: Every day | ORAL | 3 refills | Status: DC

## 2020-05-15 MED ORDER — LACTULOSE 10 GM/15ML PO SOLN: 10.0000 g | Freq: Every day | ORAL | 6 refills | Status: DC

## 2020-05-15 MED ORDER — AMLODIPINE BESYLATE 10 MG PO TABS: 10.0000 mg | ORAL_TABLET | Freq: Every day | ORAL | 3 refills | Status: DC

## 2020-05-15 NOTE — Progress Notes (Signed)
  Date of Visit: 05/15/2020   SUBJECTIVE:   HPI:  Emorie presents today for routine follow up.  Last visit was having severe abdominal pain with difficulty tolerating PO. Seen in ED, workup unremarkable, advised to increase lactulose to daily, which she has done. Was also given rx for bentyl that she completed. Since ED visit, belly pain much better. No dark stools anymore. Tolerating PO. Continues on lactulose daily for constipation.  Hypertension - taking amlodipine 10mg  daily and lisinopril 20mg  daily, takes both at night. Tolerating well. Needs refills.  Of note sprained her wrist, also seen in ED, has follow up appointment with ortho in 2 days.  OBJECTIVE:   BP 110/80   Pulse 83   Ht 5' (1.524 m)   Wt 148 lb 3.2 oz (67.2 kg)   SpO2 98%   BMI 28.94 kg/m  Gen: no acute distress, pleasant, cooperative HEENT: normocephalic, atraumatic  Heart: regular rate and rhythm, no murmur Lungs: clear to auscultation bilaterally, normal work of breathing Abdomen: soft, nontender to palpation, no masses palpable Neuro: alert, speech normal Ext: No appreciable lower extremity edema bilaterally   ASSESSMENT/PLAN:   Health maintenance:  -refer to GI for colonoscopy -otherwise UTD on HM items  Hypertension Well controlled. Continue current medication regimen.   Chronic kidney disease, stage 3 (H[facility]) Creatinine noted high at recent ED visit, likely was dehydrated at that time. Repeat BMET today. Followed by nephrology.  Constipation Abdominal pain resolved. Continue lactulose daily. Refilled.   FOLLOW UP: Follow up in 6 mos for routine medical problems, sooner if needed  [place] J. Ardelia Mems, [place]

## 2020-05-15 NOTE — Assessment & Plan Note (Signed)
Abdominal pain resolved. Continue lactulose daily. Refilled.

## 2020-05-15 NOTE — Patient Instructions (Signed)
It was great to see you again today!  I'm glad you are doing better now. Checking kidney function today  Refilled medications  Follow up with me in 6 months, sooner if needed  Be well, Dr. Ardelia Mems

## 2020-05-15 NOTE — Assessment & Plan Note (Signed)
Creatinine noted high at recent ED visit, likely was dehydrated at that time. Repeat BMET today. Followed by nephrology.

## 2020-05-15 NOTE — Assessment & Plan Note (Signed)
Well controlled. Continue current medication regimen.  

## 2020-05-16 ENCOUNTER — Encounter: Payer: Self-pay | Admitting: Family Medicine

## 2020-05-16 LAB — BASIC METABOLIC PANEL
BUN/Creatinine Ratio: 12 (ref 12–28)
BUN: 20 mg/dL (ref 8–27)
CO2: 18 mmol/L — ABNORMAL LOW (ref 20–29)
Calcium: 9.3 mg/dL (ref 8.7–10.3)
Chloride: 103 mmol/L (ref 96–106)
Creatinine, Ser: 1.69 mg/dL — ABNORMAL HIGH (ref 0.57–1.00)
GFR calc Af Amer: 36 mL/min/{1.73_m2} — ABNORMAL LOW (ref >59)
GFR calc non Af Amer: 31 mL/min/{1.73_m2} — ABNORMAL LOW (ref >59)
Glucose: 99 mg/dL (ref 65–99)
Potassium: 4.6 mmol/L (ref 3.5–5.2)
Sodium: 139 mmol/L (ref 134–144)

## 2020-05-17 ENCOUNTER — Ambulatory Visit (INDEPENDENT_AMBULATORY_CARE_PROVIDER_SITE_OTHER): Payer: Medicare Other | Admitting: Orthopaedic Surgery

## 2020-05-17 ENCOUNTER — Telehealth: Payer: Self-pay | Admitting: Gastroenterology

## 2020-05-17 ENCOUNTER — Encounter: Payer: Self-pay | Admitting: Orthopaedic Surgery

## 2020-05-17 ENCOUNTER — Other Ambulatory Visit: Payer: Self-pay

## 2020-05-17 DIAGNOSIS — M25532 Pain in left wrist: Secondary | ICD-10-CM

## 2020-05-17 NOTE — Telephone Encounter (Signed)
Reviewed records. Patient had 2 Tubular Adenomas. Previous recommendation of 5-years is reasonable to continue at this time. We will move forward with updated Colorectal Cancer screening guidelines based on the findings at the next procedure. She can be scheduled for direct colonoscopy next available with me. Thank you. GM

## 2020-05-17 NOTE — Progress Notes (Signed)
Office Visit Note   Patient: Jennifer Moss           Date of Birth: 10-Oct-1951           MRN: [id number] Visit Date: 05/17/2020              Requested by: Leeanne Rio, [address] [place],  [place] [address] PCP: Leeanne Rio, MD   Assessment & Plan: Visit Diagnoses:  1. Pain in left wrist     Plan: Impression is left wrist pain consider sprain versus TFCC tear.  I would like to immobilize this for another few weeks in a short wrist brace.  She will continue to treat the symptoms with over-the-counter NSAIDs and ice.  Patient instructed to follow-up if she does not notice any improvement over the next few weeks.  Questions encouraged and answered.  Follow-up as needed.  Follow-Up Instructions: Return if symptoms worsen or fail to improve.   Orders:  No orders of the defined types were placed in this encounter.  No orders of the defined types were placed in this encounter.     Procedures: No procedures performed   Clinical Data: No additional findings.   Subjective: Chief Complaint  Patient presents with  . Left Wrist - Pain    Jennifer Moss is a 69 year old female who is a referral from the ER for left wrist pain due to a mechanical fall about 9 days ago.  Most of her wrist pain has resolved except for persistent ulnar-sided wrist pain.  Denies any radiation or numbness or tingling.  She was initially placed in a thumb spica splint from the ER.  She is currently taking Tylenol for the pain which does help.   Review of Systems  Constitutional: Negative.   HENT: Negative.   Eyes: Negative.   Respiratory: Negative.   Cardiovascular: Negative.   Endocrine: Negative.   Musculoskeletal: Negative.   Neurological: Negative.   Hematological: Negative.   Psychiatric/Behavioral: Negative.   All other systems reviewed and are negative.    Objective: Vital Signs: There were no vitals taken for this visit.  Physical Exam Vitals and nursing  note reviewed.  Constitutional:      Appearance: She is well-developed.  HENT:     Head: Normocephalic and atraumatic.  Pulmonary:     Effort: Pulmonary effort is normal.  Abdominal:     Palpations: Abdomen is soft.  Musculoskeletal:     Cervical back: Neck supple.  Skin:    General: Skin is warm.     Capillary Refill: Capillary refill takes less than 2 seconds.  Neurological:     Mental Status: She is alert and oriented to person, place, and time.  Psychiatric:        Behavior: Behavior normal.        Thought Content: Thought content normal.        Judgment: Judgment normal.     Ortho Exam Left wrist shows significant tenderness of the ulnar styloid and just distal to it in the region of the TFCC.  No subluxation of tendons.  Moderate restriction range of motion of the wrist secondary to discomfort.  SL interval is not tender.  Snuffbox nontender. Specialty Comments:  No specialty comments available.  Imaging: No results found.   PMFS History: Patient Active Problem List   Diagnosis Date Noted  . Overweight 10/28/2019  . Headache 07/12/2018  . Hirsutism 08/28/2017  . Gingival enlargement 04/07/2016  . Special screening for malignant neoplasms,  colon   . Rib pain 02/24/2015  . BRBPR (bright red blood per rectum) 04/26/2014  . Diverticulosis 04/26/2014  . Preventative health care 04/26/2014  . Hyperlipidemia 11/28/2013  . Asthma 04/03/2013  . 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 Constipation 01/28/2013  . Routine health maintenance 01/28/2013  . Injury of left shoulder 01/19/2013  . Chest pain 01/12/2013  . Mass of soft tissue 01/12/2013  . Prolonged Q-T interval on ECG 01/11/2013  . Leg pain 01/11/2013  . Mallory-Weiss tear 12/23/2012  . Rectal bleeding 12/23/2012  . Hypertension 12/23/2012  . Drug abuse (including cocaine) 12/23/2012  . 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 Chronic kidney disease, stage 3 ([place]) 12/23/2012   Past Medical History:    Diagnosis Date  . Anginal pain ([place])   . Arthritis   . Chronic kidney disease    rt kidney horseshoe  . Diverticulitis   . Drug abuse ([place])   . Hyperlipemia   . Hypertension   . Mallory-Weiss tear   . Muscle spasms of both lower extremities   . Rectal bleed   . Retinal detachment    R eye, per patient  . Shortness of breath     Family History  Problem Relation Age of Onset  . Coronary artery disease Father   . Heart attack Father   . Asthma Father   . Hypertension Mother   . Asthma Mother   . Coronary artery disease Maternal Grandmother   . Coronary artery disease Maternal Uncle   . Diabetes Sister   . Diabetes Paternal Grandmother   . Kidney disease Paternal Grandmother   . Colon cancer Neg Hx   . Breast cancer Neg Hx     Past Surgical History:  Procedure Laterality Date  . APPENDECTOMY    . COLONOSCOPY N/A 04/11/2015   Procedure: COLONOSCOPY;  Surgeon: Danie Binder, MD;  [facility];  Service: Endoscopy;  Laterality: N/A;  8:30 AM  . DIAGNOSTIC LAPAROSCOPY     for horseshoe kidney  . ESOPHAGOGASTRODUODENOSCOPY  12/17/2012   Procedure: ESOPHAGOGASTRODUODENOSCOPY (EGD);  Surgeon: Wonda Horner, MD;  Location: [facility] ENDOSCOPY;  Service: Endoscopy;  Laterality: N/A;  . Lipoma removal     in 1990s had an egg-sized tumor removed from her leg, she thinks it was a lipoma  . repair of detached retina    . TUBAL LIGATION     Social History   Occupational History  . Occupation: Retired    Comment: Training and development officer at [employer]  . Smoking status: Never Smoker  . Smokeless tobacco: Never Used  Substance and Sexual Activity  . Alcohol use: Yes    Alcohol/week: 0.0 standard drinks    Comment: occassional drink at holidays  . Drug use: No    Frequency: 3.0 times per week    Types: Marijuana, Cocaine    Comment: cocaine and THC + in UDS in November 2013. Last used 1 year ago as of 04/11/15  . Sexual activity: Not Currently

## 2020-05-17 NOTE — Telephone Encounter (Signed)
Hi Dr. Rush Landmark, we have received a referral from patient's PCP for a repeat colon. Patient had colon in 2016 with Dr. Oneida Alar. Patient stated that she was a pt at [facility] because we did not par with her insurance but she has a new insurance now and would like to continue her GI care with [place]. Patient records are in [place]. Could you please review them and advise on scheduling? Thank you.

## 2020-05-22 NOTE — Telephone Encounter (Signed)
Left message to call back  

## 2020-05-23 ENCOUNTER — Telehealth: Payer: Self-pay | Admitting: Gastroenterology

## 2020-05-23 ENCOUNTER — Telehealth: Payer: Self-pay | Admitting: Orthopaedic Surgery

## 2020-05-23 NOTE — Telephone Encounter (Signed)
No lifting more than 10 pounds or repetitive use of the wrist for 2 weeks

## 2020-05-23 NOTE — Telephone Encounter (Signed)
See message.

## 2020-05-23 NOTE — Telephone Encounter (Signed)
Patient called.   She is requesting a note returning her to work and detailing any restrictions she has.   Call back: [phone]

## 2020-05-23 NOTE — Telephone Encounter (Signed)
Waiting for the records to review. Thanks.

## 2020-05-25 ENCOUNTER — Encounter: Payer: Self-pay | Admitting: Gastroenterology

## 2020-05-25 NOTE — Telephone Encounter (Signed)
Note made. Patient aware its ready for pick up.

## 2020-06-27 ENCOUNTER — Other Ambulatory Visit: Payer: Self-pay

## 2020-06-27 ENCOUNTER — Ambulatory Visit (AMBULATORY_SURGERY_CENTER): Payer: Self-pay

## 2020-06-27 VITALS — Ht 60.0 in | Wt 143.8 lb

## 2020-06-27 DIAGNOSIS — Z86010 Personal history of colonic polyps: Secondary | ICD-10-CM

## 2020-06-27 MED ORDER — SUTAB 1479-225-188 MG PO TABS: 12.0000 | ORAL_TABLET | ORAL | 0 refills | Status: DC

## 2020-06-27 NOTE — Progress Notes (Signed)
No allergies to soy or egg Pt is not on blood thinners or diet pills Denies issues with sedation/intubation Denies atrial flutter/fib  Has constipation-prep changed to 2 day sutab  Pt is aware of Covid safety and care partner requirements.

## 2020-06-28 DIAGNOSIS — H25811 Combined forms of age-related cataract, right eye: Secondary | ICD-10-CM | POA: Diagnosis not present

## 2020-07-05 ENCOUNTER — Telehealth: Payer: Self-pay | Admitting: Gastroenterology

## 2020-07-05 NOTE — Telephone Encounter (Signed)
Pt states her medication SUTAB is not covered by her insurance. Pt is wanting to know if there is a cheaper alternative.

## 2020-07-05 NOTE — Telephone Encounter (Signed)
Spoke with patient, pt aware that RX should be $40 with SUTAB universal code.  Advised patient that I would fax universal code to pharmacy, code was also on original prescription.

## 2020-07-11 ENCOUNTER — Ambulatory Visit (AMBULATORY_SURGERY_CENTER): Payer: Medicare Other | Admitting: Gastroenterology

## 2020-07-11 ENCOUNTER — Other Ambulatory Visit: Payer: Self-pay

## 2020-07-11 ENCOUNTER — Encounter: Payer: Self-pay | Admitting: Gastroenterology

## 2020-07-11 VITALS — BP 94/64 | HR 67 | Temp 96.4°F | Resp 20 | Ht 60.0 in | Wt 143.8 lb

## 2020-07-11 DIAGNOSIS — D123 Benign neoplasm of transverse colon: Secondary | ICD-10-CM

## 2020-07-11 DIAGNOSIS — Z86010 Personal history of colonic polyps: Secondary | ICD-10-CM | POA: Diagnosis not present

## 2020-07-11 DIAGNOSIS — D120 Benign neoplasm of cecum: Secondary | ICD-10-CM

## 2020-07-11 MED ORDER — SODIUM CHLORIDE 0.9 % IV SOLN: 500.0000 mL | Freq: Once | INTRAVENOUS | Status: DC

## 2020-07-11 NOTE — Progress Notes (Signed)
Pt's states no medical or surgical changes since previsit or office visit. 

## 2020-07-11 NOTE — Progress Notes (Signed)
CW vitals and NS IV.

## 2020-07-11 NOTE — Progress Notes (Signed)
Called to room to assist during endoscopic procedure.  Patient ID and intended procedure confirmed with present staff. Received instructions for my participation in the procedure from the performing physician.  

## 2020-07-11 NOTE — Op Note (Signed)
[facility] Patient Name: Jennifer Moss Procedure Date: 07/11/2020 7:17 AM MRN: [id number] Endoscopist: Thornton Park MD, MD Age: 69 Referring MD:  Date of Birth: October 28, 1951 Gender: Female Account #: [id number] Procedure:                Colonoscopy Indications:              Surveillance: Personal history of adenomatous                            polyps on last colonoscopy 5 years ago                           Colonoscopy with Dr. Oneida Alar 04/11/15: severe                            pancolonic diverticulosis, 4 polyps (2 tubular                            adenomas and 2 hyperplastic polyps), large external                            hemorrhoids Medicines:                Monitored Anesthesia Care Procedure:                Pre-Anesthesia Assessment:                           - Prior to the procedure, a History and Physical                            was performed, and patient medications and                            allergies were reviewed. The patient's tolerance of                            previous anesthesia was also reviewed. The risks                            and benefits of the procedure and the sedation                            options and risks were discussed with the patient.                            All questions were answered, and informed consent                            was obtained. Prior Anticoagulants: The patient has                            taken no previous anticoagulant or antiplatelet  agents. ASA Grade Assessment: II - A patient with                            mild systemic disease. After reviewing the risks                            and benefits, the patient was deemed in                            satisfactory condition to undergo the procedure.                           - Prior to the procedure, a History and Physical                            was performed, and patient medications and                             allergies were reviewed. The patient's tolerance of                            previous anesthesia was also reviewed. The risks                            and benefits of the procedure and the sedation                            options and risks were discussed with the patient.                            All questions were answered, and informed consent                            was obtained. Prior Anticoagulants: The patient has                            taken no previous anticoagulant or antiplatelet                            agents. ASA Grade Assessment: II - A patient with                            mild systemic disease. After reviewing the risks                            and benefits, the patient was deemed in                            satisfactory condition to undergo the procedure.                           After obtaining informed consent, the colonoscope  was passed under direct vision. Throughout the                            procedure, the patient's blood pressure, pulse, and                            oxygen saturations were monitored continuously. The                            Colonoscope was introduced through the anus and                            advanced to the 3 cm into the ileum. A second                            forward view of the right colon was performed. The                            colonoscopy was performed with moderate difficulty                            due to restricted mobility of the colon. Successful                            completion of the procedure was aided by applying                            abdominal pressure. The patient tolerated the                            procedure well. The quality of the bowel                            preparation was good. The terminal ileum, ileocecal                            valve, appendiceal orifice, and rectum were                            photographed. Scope In:  8:03:56 AM Scope Out: 8:20:34 AM Scope Withdrawal Time: 0 hours 11 minutes 4 seconds  Total Procedure Duration: 0 hours 16 minutes 38 seconds  Findings:                 Hemorrhoids were found on perianal exam.                           Non-bleeding internal hemorrhoids were found.                           Multiple small and large-mouthed diverticula were                            found in the entire colon. They were most dense in  the sigmoid colon where there was some associated                            narrowing of the colon lumen. There was no evidence                            for diverticulitis.                           Two flat polyps were found in the transverse colon                            and ileocecal valve. The polyps were 1 mm in size.                            These polyps were removed with a cold biopsy                            forceps. Resection and retrieval were complete.                            Estimated blood loss was minimal.                           The exam was otherwise without abnormality on                            direct and retroflexion views. Complications:            No immediate complications. Estimated blood loss:                            Minimal. Estimated Blood Loss:     Estimated blood loss was minimal. Impression:               - Hemorrhoids found on perianal exam.                           - Non-bleeding internal hemorrhoids.                           - Diverticulosis in the entire examined colon.                           - Two 1 mm polyps in the transverse colon and at                            the ileocecal valve, removed with a cold biopsy                            forceps. Resected and retrieved.                           - The examination was otherwise normal on direct  and retroflexion views. Recommendation:           - Patient has a contact number available for                             emergencies. The signs and symptoms of potential                            delayed complications were discussed with the                            patient. Return to normal activities tomorrow.                            Written discharge instructions were provided to the                            patient.                           - Follow a high fiber diet. Drink at least 64                            ounces of water daily. Add a daily stool bulking                            agent such as psyllium (an exampled would be                            Metamucil).                           - Continue present medications.                           - Await pathology results.                           - Repeat colonoscopy in 7 years for surveillance if                            at least one polyp is an adenoma.                           - Emerging evidence supports eating a diet of                            fruits, vegetables, grains, calcium, and yogurt                            while reducing red meat and alcohol may reduce the                            risk of colon cancer.                           -  Thank you for allowing me to be involved in your                            colon cancer prevention. Thornton Park MD, MD 07/11/2020 8:28:02 AM This report has been signed electronically.

## 2020-07-11 NOTE — Patient Instructions (Signed)
YOU HAD AN ENDOSCOPIC PROCEDURE TODAY AT THE [facility]:   Refer to the procedure report that was given to you for any specific questions about what was found during the examination.  If the procedure report does not answer your questions, please call your gastroenterologist to clarify.  If you requested that your care partner not be given the details of your procedure findings, then the procedure report has been included in a sealed envelope for you to review at your convenience later. ° °**Handouts given on polyps and diverticulosis** ° °YOU SHOULD EXPECT: Some feelings of bloating in the abdomen. Passage of more gas than usual.  Walking can help get rid of the air that was put into your GI tract during the procedure and reduce the bloating. If you had a lower endoscopy (such as a colonoscopy or flexible sigmoidoscopy) you may notice spotting of blood in your stool or on the toilet paper. If you underwent a bowel prep for your procedure, you may not have a normal bowel movement for a few days. ° °Please Note:  You might notice some irritation and congestion in your nose or some drainage.  This is from the oxygen used during your procedure.  There is no need for concern and it should clear up in a day or so. ° °SYMPTOMS TO REPORT IMMEDIATELY: ° °Following lower endoscopy (colonoscopy or flexible sigmoidoscopy): ° Excessive amounts of blood in the stool ° Significant tenderness or worsening of abdominal pains ° Swelling of the abdomen that is new, acute ° Fever of 100°F or higher ° °For urgent or emergent issues, a gastroenterologist can be reached at any hour by calling [phone]. °Do not use MyChart messaging for urgent concerns.  ° ° °DIET:  We do recommend a small meal at first, but then you may proceed to your regular diet.  Drink plenty of fluids but you should avoid alcoholic beverages for 24 hours. ° °ACTIVITY:  You should plan to take it easy for the rest of today and you should NOT DRIVE  or use heavy machinery until tomorrow (because of the sedation medicines used during the test).   ° °FOLLOW UP: °Our staff will call the number listed on your records 48-72 hours following your procedure to check on you and address any questions or concerns that you may have regarding the information given to you following your procedure. If we do not reach you, we will leave a message.  We will attempt to reach you two times.  During this call, we will ask if you have developed any symptoms of COVID 19. If you develop any symptoms (ie: fever, flu-like symptoms, shortness of breath, cough etc.) before then, please call [phone].  If you test positive for Covid 19 in the 2 weeks post procedure, please call and report this information to us.   ° °If any biopsies were taken you will be contacted by phone or by letter within the next 1-3 weeks.  Please call us at [phone] if you have not heard about the biopsies in 3 weeks.  ° ° °SIGNATURES/CONFIDENTIALITY: °You and/or your care partner have signed paperwork which will be entered into your electronic medical record.  These signatures attest to the fact that that the information above on your After Visit Summary has been reviewed and is understood.  Full responsibility of the confidentiality of this discharge information lies with you and/or your care-partner.  °

## 2020-07-11 NOTE — Progress Notes (Signed)
PT taken to PACU. Monitors in place. VSS. Report given to RN. 

## 2020-07-13 ENCOUNTER — Telehealth: Payer: Self-pay | Admitting: *Deleted

## 2020-07-13 NOTE — Telephone Encounter (Signed)
  Follow up Call-  Call back number 07/11/2020  Post procedure Call Back phone  # [phone]  Permission to leave phone message Yes  Some recent data might be hidden     Patient questions:  Do you have a fever, pain , or abdominal swelling? No. Pain Score  0 *  Have you tolerated food without any problems? Yes.    Have you been able to return to your normal activities? Yes.    Do you have any questions about your discharge instructions: Diet   No. Medications  No. Follow up visit  No.  Do you have questions or concerns about your Care? No.  Actions: * If pain score is 4 or above: No action needed, pain <4.  1. Have you developed a fever since your procedure? no  2.   Have you had an respiratory symptoms (SOB or cough) since your procedure? no  3.   Have you tested positive for COVID 19 since your procedure no  4.   Have you had any family members/close contacts diagnosed with the COVID 19 since your procedure?  no   If yes to any of these questions please route to Joylene John, RN and Erenest Rasher, RN

## 2020-07-18 ENCOUNTER — Encounter: Payer: Self-pay | Admitting: Gastroenterology

## 2020-09-05 DIAGNOSIS — D631 Anemia in chronic kidney disease: Secondary | ICD-10-CM | POA: Diagnosis not present

## 2020-09-05 DIAGNOSIS — N189 Chronic kidney disease, unspecified: Secondary | ICD-10-CM | POA: Diagnosis not present

## 2020-09-05 DIAGNOSIS — N2581 Secondary hyperparathyroidism of renal origin: Secondary | ICD-10-CM | POA: Diagnosis not present

## 2020-09-05 DIAGNOSIS — N183 Chronic kidney disease, stage 3 unspecified: Secondary | ICD-10-CM | POA: Diagnosis not present

## 2020-09-05 DIAGNOSIS — Q631 Lobulated, fused and horseshoe kidney: Secondary | ICD-10-CM | POA: Diagnosis not present

## 2020-09-05 DIAGNOSIS — I129 Hypertensive chronic kidney disease with stage 1 through stage 4 chronic kidney disease, or unspecified chronic kidney disease: Secondary | ICD-10-CM | POA: Diagnosis not present

## 2020-10-04 ENCOUNTER — Other Ambulatory Visit: Payer: Self-pay

## 2020-10-04 ENCOUNTER — Encounter: Payer: Self-pay | Admitting: Family Medicine

## 2020-10-04 ENCOUNTER — Ambulatory Visit (INDEPENDENT_AMBULATORY_CARE_PROVIDER_SITE_OTHER): Payer: Medicare Other | Admitting: Family Medicine

## 2020-10-04 VITALS — BP 110/80 | HR 78 | Wt 139.8 lb

## 2020-10-04 DIAGNOSIS — I10 Essential (primary) hypertension: Secondary | ICD-10-CM

## 2020-10-04 DIAGNOSIS — N1832 Chronic kidney disease, stage 3b: Secondary | ICD-10-CM

## 2020-10-04 DIAGNOSIS — Z23 Encounter for immunization: Secondary | ICD-10-CM

## 2020-10-04 DIAGNOSIS — E785 Hyperlipidemia, unspecified: Secondary | ICD-10-CM

## 2020-10-04 DIAGNOSIS — G4700 Insomnia, unspecified: Secondary | ICD-10-CM

## 2020-10-04 MED ORDER — ATORVASTATIN CALCIUM 10 MG PO TABS: 10.0000 mg | ORAL_TABLET | Freq: Every day | ORAL | 3 refills | Status: DC

## 2020-10-04 NOTE — Progress Notes (Signed)
  Date of Visit: 10/04/2020   SUBJECTIVE:   HPI:  Jennifer Moss presents today to discuss her kidney disease.  CKD - saw nephrologist on 9/8, had labs drawn. She was called with the result of her labs but didn't have the chance to ask questions and just wants to review them today. Creatinine was 1.7, eGFR 31.  Trouble sleeping - goes to bed around 8, can't sleep until 10-10:30. Has trouble falling asleep and staying asleep. Does not want medication to sleep, just wants to discuss ways of sleeping better. She does lay in bed and watch TV while trying to fall asleep. Struggles with when the room is quiet.  Wants high dose flu shot  Hyperlipidemia - previously did not tolerate statin (lovastatin or pravastatin). She is agreeable to trying a low dose of atorvastatin.  OBJECTIVE:   BP 110/80   Pulse 78   Wt 139 lb 12.8 oz (63.4 kg)   SpO2 97%   BMI 27.30 kg/m  Gen: no acute distress, pleasant, cooperative HEENT: normocephalic, atraumatic  Lungs: normal work of breathing  Neuro: alert, speech normal, grossly nonfocal  ASSESSMENT/PLAN:   Health maintenance:  -high dose flu shot given today -does not meet criteria for COVID booster as she had moderna vaccine, advised to check back if moderna gets approved for boosters  Chronic kidney disease, stage 3 ([place]) Reviewed recent labs with patient from nephrologist. Labs overall stable. Discussed kidney disease stages, importance of avoiding NSAIDs Blood pressure is well controlled today on present medications Reviewed holding lisinopril if she has a dehydrating acute illness Patient appreciative & reassured.   Hypertension Well controlled. Continue current medication regimen.   Hyperlipidemia Prev didn't tolerate lovastatin or pravastatin Trial of low dose atorvastatin. Advised even if she takes it once a week, can likely get some cardiovascular protective benefit  Insomnia Discussed sleep hygiene, gave handout Encouraged use of white  noise machine rather than TV to help her sleep  FOLLOW UP: Follow up in 2 mos for hyperlipidemia   Jennifer Moss, [place]

## 2020-10-04 NOTE — Assessment & Plan Note (Signed)
Discussed sleep hygiene, gave handout Encouraged use of white noise machine rather than TV to help her sleep

## 2020-10-04 NOTE — Assessment & Plan Note (Signed)
Reviewed recent labs with patient from nephrologist. Labs overall stable. Discussed kidney disease stages, importance of avoiding NSAIDs Blood pressure is well controlled today on present medications Reviewed holding lisinopril if she has a dehydrating acute illness Patient appreciative & reassured.

## 2020-10-04 NOTE — Assessment & Plan Note (Signed)
Prev didn't tolerate lovastatin or pravastatin Trial of low dose atorvastatin. Advised even if she takes it once a week, can likely get some cardiovascular protective benefit

## 2020-10-04 NOTE — Patient Instructions (Signed)
It was great to see you again today!  Try a white noise machine to help with sleep See handout on sleep hygiene  Start atorvastatin 10mg  daily  Follow up in 2 months, we'll recheck your cholesterol then  Flu shot today  Be well, Dr. Ardelia Mems

## 2020-10-04 NOTE — Assessment & Plan Note (Signed)
Well controlled. Continue current medication regimen.  

## 2020-12-04 ENCOUNTER — Other Ambulatory Visit: Payer: Self-pay | Admitting: Family Medicine

## 2020-12-04 DIAGNOSIS — Z1231 Encounter for screening mammogram for malignant neoplasm of breast: Secondary | ICD-10-CM

## 2021-01-16 ENCOUNTER — Ambulatory Visit: Payer: Medicare Other

## 2021-01-18 ENCOUNTER — Ambulatory Visit
Admission: RE | Admit: 2021-01-18 | Discharge: 2021-01-18 | Disposition: A | Discharge status: Home / Self Care | Payer: Medicare Other | Source: Ambulatory Visit | Attending: Family Medicine | Admitting: Family Medicine

## 2021-01-18 ENCOUNTER — Other Ambulatory Visit: Payer: Self-pay

## 2021-01-18 DIAGNOSIS — Z1231 Encounter for screening mammogram for malignant neoplasm of breast: Secondary | ICD-10-CM

## 2021-04-23 ENCOUNTER — Other Ambulatory Visit: Payer: Self-pay

## 2021-04-23 ENCOUNTER — Ambulatory Visit (INDEPENDENT_AMBULATORY_CARE_PROVIDER_SITE_OTHER): Payer: Medicare Other | Admitting: Family Medicine

## 2021-04-23 VITALS — BP 114/87 | HR 86 | Ht 60.0 in | Wt 128.0 lb

## 2021-04-23 DIAGNOSIS — N183 Chronic kidney disease, stage 3 unspecified: Secondary | ICD-10-CM | POA: Diagnosis not present

## 2021-04-23 DIAGNOSIS — I10 Essential (primary) hypertension: Secondary | ICD-10-CM

## 2021-04-23 DIAGNOSIS — G629 Polyneuropathy, unspecified: Secondary | ICD-10-CM

## 2021-04-23 DIAGNOSIS — Z23 Encounter for immunization: Secondary | ICD-10-CM | POA: Diagnosis not present

## 2021-04-23 DIAGNOSIS — Z0000 Encounter for general adult medical examination without abnormal findings: Secondary | ICD-10-CM | POA: Diagnosis not present

## 2021-04-23 DIAGNOSIS — R519 Headache, unspecified: Secondary | ICD-10-CM

## 2021-04-23 DIAGNOSIS — R5383 Other fatigue: Secondary | ICD-10-CM

## 2021-04-23 MED ORDER — AMLODIPINE BESYLATE 10 MG PO TABS: 5.0000 mg | ORAL_TABLET | Freq: Every day | ORAL | 1 refills | Status: DC

## 2021-04-23 NOTE — Assessment & Plan Note (Signed)
BP readings at home well controlled around 117/80, sometimes 105/79. Initial clinical BP check low at 100/80 with normal recheck at 114/87. Loss of balance after standing during physical exam. BP appears slightly over-controlled.   Plan: Continue Lisinopril Decrease Amlodipine to 5mg  daily Will recheck in one month.

## 2021-04-23 NOTE — Assessment & Plan Note (Signed)
Pt has received primary Moderna series against [id number], as well as booster in November 2021. Now eligible for second booster. Pt prefers to receive Moderna which is not carried in clinic. Would prefer PCV20 today.  Plan: Pt to receive second Moderna booster in local pharmacy Pt received PCV20 today in clinic.

## 2021-04-23 NOTE — Patient Instructions (Addendum)
It was great to see you again today!  Checking labs and MRI to evaluate your symptoms Referring to neurologist  Pneumonia shot today   You can get the moderna booster at your pharmacy  Decrease amlodipine to 5mg  daily. Can break your 10mg  pill in half.  Follow up with me in 1 month, sooner if needed  Be well, Dr. Ardelia Mems

## 2021-04-23 NOTE — Assessment & Plan Note (Addendum)
One month of changes in chronic headache pattern. Now sharp and posteriorly located, and waking pt out of sleep. 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 No focal deficits on neuro exam reassuring. Up to date with age appropriate cancer screenings.  Plan: Will order Brain MRI Referral to Neuro for further evaluation Labs today to eval neuropathy - TSH, CMET, CBC diff, HIV, RPR, B12 RTC in 1 month

## 2021-04-23 NOTE — Progress Notes (Signed)
SUBJECTIVE:   CHIEF COMPLAINT / HPI:   Jennifer Moss returns today for routine follow up, but discussion involved new concerns:  Polyneuropathy [name] notes that she has had intermittent tingling, numbness, pin-like sensations in both of her arms from her shoulders down, for the last month. Symptoms presents when lying down or when driving, day and night. She also endorses tingling pin-like sensations in four out of five toes on the right side, sparing the big toe.   Headaches The pt endorses worsened, more frequent headaches in the last month than her baseline. She describes pain in the back of her head that is sharp, lasting a few seconds to a few minutes, and wakes her from sleep 2-3 nights a week. She denies vision changes but notes she has been more forgetful and is repeating herself more often. States "something is wrong with my head." Vaguely endorses some positional components when looking to the side. She has been using Tylenol. Last head imaging was Brain MRI in December 2019. Also has had episodes of fatigue that "come over" her.  Weight Loss The pt notes that she gained weight during the pandemic initially, but spoke to a nutritionist about one year ago and has made lifestyle modifications since then. She reports walking in the mornings, eating more salads and fruits, but also notes some decreased appetite in the middle of the day, so eats fewer lunches preferring to snack instead. Her weight loss is expected and appreciated but the pt would like to stop losing weight now.   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 Nursing note reviewed: Hypotensive at 100/80. Recheck normal at 114/87.  Constitutional:      General: She is not in acute distress.    Appearance: Normal appearance.  Eyes:     Extraocular  Movements: Extraocular movements intact.     Pupils: Pupils are equal, round, and reactive to light.  Cardiovascular:     Rate and Rhythm: Normal rate and regular rhythm.     Pulses: Normal pulses.     Heart sounds: Normal heart sounds.  Pulmonary:     Effort: Pulmonary effort is normal.     Breath sounds: Normal breath sounds.  Musculoskeletal:        General: No tenderness. Normal range of motion.     Cervical back: Normal range of motion. No rigidity or tenderness.  Skin:    General: Skin is warm and dry.     Capillary Refill: Capillary refill takes less than 2 seconds.  Neurological:     General: No focal deficit present.     Mental Status: She is alert and oriented to person, place, and time.     Cranial Nerves: No cranial nerve deficit.     Sensory: No sensory deficit.     Motor: No weakness.     Deep Tendon Reflexes: Reflexes normal.  Psychiatric:        Mood and Affect: Mood normal.        Behavior: Behavior normal.      ASSESSMENT/PLAN:   Hypertension BP readings at home well controlled around 117/80, sometimes 105/79. Initial clinical BP check low at 100/80 with normal recheck at 114/87. Loss of balance after  standing during physical exam. BP appears slightly over-controlled.   Plan: Continue Lisinopril Decrease Amlodipine to 5mg  daily Will recheck in one month.  Headache One month of changes in chronic headache pattern. Now sharp and posteriorly located, and waking pt out of sleep. 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 No focal deficits on neuro exam reassuring. Up to date with age appropriate cancer screenings.  Plan: Will order Brain MRI Referral to Neuro for further evaluation Labs today to eval neuropathy - TSH, CMET, CBC diff, HIV, RPR, B12 RTC in 1 month  Healthcare maintenance Pt has received primary Moderna series against Covid19, as well as booster in November 2021. Now eligible for second  booster. Pt prefers to receive Moderna which is not carried in clinic. Would prefer PCV20 today.  Plan: Pt to receive second Moderna booster in local pharmacy Pt received PCV20 today in clinic.     [place]    Patient seen along with MS[employer] student [place]. I personally evaluated this patient along with the student, and verified all aspects of the history, physical exam, and medical decision making as documented by the student. I agree with the student's documentation and have made all necessary edits.  Chrisandra Netters, MD  [facility]

## 2021-04-26 ENCOUNTER — Telehealth: Payer: Self-pay | Admitting: *Deleted

## 2021-04-26 DIAGNOSIS — G629 Polyneuropathy, unspecified: Secondary | ICD-10-CM

## 2021-04-26 LAB — CMP14+EGFR
ALT: 17 IU/L (ref 0–32)
AST: 22 IU/L (ref 0–40)
Albumin/Globulin Ratio: 1.9 (ref 1.2–2.2)
Albumin: 4.7 g/dL (ref 3.8–4.8)
Alkaline Phosphatase: 146 IU/L — ABNORMAL HIGH (ref 44–121)
BUN/Creatinine Ratio: 10 — ABNORMAL LOW (ref 12–28)
BUN: 19 mg/dL (ref 8–27)
Bilirubin Total: 0.4 mg/dL (ref 0.0–1.2)
CO2: 16 mmol/L — ABNORMAL LOW (ref 20–29)
Calcium: 9.5 mg/dL (ref 8.7–10.3)
Chloride: 102 mmol/L (ref 96–106)
Creatinine, Ser: 1.9 mg/dL — ABNORMAL HIGH (ref 0.57–1.00)
Globulin, Total: 2.5 g/dL (ref 1.5–4.5)
Glucose: 96 mg/dL (ref 65–99)
Potassium: 4.3 mmol/L (ref 3.5–5.2)
Sodium: 142 mmol/L (ref 134–144)
Total Protein: 7.2 g/dL (ref 6.0–8.5)
eGFR: 29 mL/min/{1.73_m2} — ABNORMAL LOW (ref >59)

## 2021-04-26 LAB — CBC WITH DIFFERENTIAL/PLATELET
Basophils Absolute: 0 10*3/uL (ref 0.0–0.2)
Basos: 0 %
EOS (ABSOLUTE): 0 10*3/uL (ref 0.0–0.4)
Eos: 0 %
Hematocrit: 48.9 % — ABNORMAL HIGH (ref 34.0–46.6)
Hemoglobin: 16.1 g/dL — ABNORMAL HIGH (ref 11.1–15.9)
Immature Grans (Abs): 0 10*3/uL (ref 0.0–0.1)
Immature Granulocytes: 0 %
Lymphocytes Absolute: 1.7 10*3/uL (ref 0.7–3.1)
Lymphs: 15 %
MCH: 27.9 pg (ref 26.6–33.0)
MCHC: 32.9 g/dL (ref 31.5–35.7)
MCV: 85 fL (ref 79–97)
Monocytes Absolute: 0.5 10*3/uL (ref 0.1–0.9)
Monocytes: 4 %
Neutrophils Absolute: 8.9 10*3/uL — ABNORMAL HIGH (ref 1.4–7.0)
Neutrophils: 81 %
Platelets: 378 10*3/uL (ref 150–450)
RBC: 5.78 x10E6/uL — ABNORMAL HIGH (ref 3.77–5.28)
RDW: 14.4 % (ref 11.7–15.4)
WBC: 11.2 10*3/uL — ABNORMAL HIGH (ref 3.4–10.8)

## 2021-04-26 LAB — TSH: TSH: 0.862 u[IU]/mL (ref 0.450–4.500)

## 2021-04-26 LAB — VITAMIN B12

## 2021-04-26 LAB — VITAMIN D 25 HYDROXY (VIT D DEFICIENCY, FRACTURES): Vit D, 25-Hydroxy: 15.6 ng/mL — ABNORMAL LOW (ref 30.0–100.0)

## 2021-04-26 LAB — HIV ANTIBODY (ROUTINE TESTING W REFLEX)

## 2021-04-26 LAB — RPR

## 2021-04-26 NOTE — Addendum Note (Signed)
Addended by: Leeanne Rio on: 04/26/2021 05:39 PM   Modules accepted: Orders

## 2021-04-26 NOTE — Telephone Encounter (Signed)
Called patient to come in for repeat lab work. First sample was QNS to perform all testing ordered. She will come in Monday morning. Jaymes Graff Shirlette Scarber

## 2021-04-27 ENCOUNTER — Ambulatory Visit (HOSPITAL_COMMUNITY)
Admission: RE | Admit: 2021-04-27 | Discharge: 2021-04-27 | Disposition: A | Discharge status: Home / Self Care | Payer: Medicare Other | Source: Ambulatory Visit | Attending: Family Medicine | Admitting: Family Medicine

## 2021-04-27 DIAGNOSIS — R519 Headache, unspecified: Secondary | ICD-10-CM | POA: Insufficient documentation

## 2021-04-29 ENCOUNTER — Other Ambulatory Visit: Payer: Self-pay

## 2021-04-29 ENCOUNTER — Other Ambulatory Visit: Payer: Medicare Other

## 2021-04-29 DIAGNOSIS — G629 Polyneuropathy, unspecified: Secondary | ICD-10-CM | POA: Diagnosis not present

## 2021-04-29 DIAGNOSIS — R202 Paresthesia of skin: Secondary | ICD-10-CM | POA: Diagnosis not present

## 2021-04-30 LAB — RPR: RPR Ser Ql: NONREACTIVE

## 2021-04-30 LAB — VITAMIN B12: Vitamin B-12: 203 pg/mL — ABNORMAL LOW (ref 232–1245)

## 2021-04-30 LAB — HIV ANTIBODY (ROUTINE TESTING W REFLEX): HIV Screen 4th Generation wRfx: NONREACTIVE

## 2021-05-01 DIAGNOSIS — N183 Chronic kidney disease, stage 3 unspecified: Secondary | ICD-10-CM | POA: Diagnosis not present

## 2021-05-01 DIAGNOSIS — Q631 Lobulated, fused and horseshoe kidney: Secondary | ICD-10-CM | POA: Diagnosis not present

## 2021-05-01 DIAGNOSIS — N2581 Secondary hyperparathyroidism of renal origin: Secondary | ICD-10-CM | POA: Diagnosis not present

## 2021-05-01 DIAGNOSIS — D631 Anemia in chronic kidney disease: Secondary | ICD-10-CM | POA: Diagnosis not present

## 2021-05-01 DIAGNOSIS — N390 Urinary tract infection, site not specified: Secondary | ICD-10-CM | POA: Diagnosis not present

## 2021-05-01 DIAGNOSIS — I129 Hypertensive chronic kidney disease with stage 1 through stage 4 chronic kidney disease, or unspecified chronic kidney disease: Secondary | ICD-10-CM | POA: Diagnosis not present

## 2021-05-15 ENCOUNTER — Other Ambulatory Visit: Payer: Self-pay | Admitting: Family Medicine

## 2021-05-15 ENCOUNTER — Telehealth: Payer: Self-pay | Admitting: Family Medicine

## 2021-05-15 DIAGNOSIS — I10 Essential (primary) hypertension: Secondary | ICD-10-CM

## 2021-05-15 DIAGNOSIS — E538 Deficiency of other specified B group vitamins: Secondary | ICD-10-CM

## 2021-05-15 MED ORDER — CHOLECALCIFEROL 1.25 MG (50000 UT) PO TABS: 1.0000 | ORAL_TABLET | ORAL | 0 refills | Status: DC

## 2021-05-15 MED ORDER — LISINOPRIL 10 MG PO TABS: 10.0000 mg | ORAL_TABLET | Freq: Every day | ORAL | 3 refills | Status: DC

## 2021-05-15 NOTE — Telephone Encounter (Signed)
Called patient to discuss labs  b12 borderline low - recommend getting follow up testing for folate, MMA, homocysteine. Lab visit scheduled for this Friday. She also has appointment with me next week.  Vitamin D low - has been taking 2000 IU daily. Recommend 50k units weekly x12 weeks. Rx sent in.  Patient reports her amlodipine was stopped by nephrology, and lisinopril decreased to 10mg  daily. Needs new rx for lisinopril. Will send in.  Patient appreciative & agreeable to this plan.  Leeanne Rio, MD

## 2021-05-17 ENCOUNTER — Other Ambulatory Visit: Payer: Self-pay

## 2021-05-17 ENCOUNTER — Other Ambulatory Visit: Payer: Medicare Other

## 2021-05-17 DIAGNOSIS — E538 Deficiency of other specified B group vitamins: Secondary | ICD-10-CM

## 2021-05-21 ENCOUNTER — Ambulatory Visit (INDEPENDENT_AMBULATORY_CARE_PROVIDER_SITE_OTHER): Payer: Medicare Other | Admitting: Family Medicine

## 2021-05-21 ENCOUNTER — Encounter: Payer: Self-pay | Admitting: Family Medicine

## 2021-05-21 ENCOUNTER — Other Ambulatory Visit: Payer: Self-pay

## 2021-05-21 VITALS — BP 144/92 | HR 66 | Wt 129.8 lb

## 2021-05-21 DIAGNOSIS — I10 Essential (primary) hypertension: Secondary | ICD-10-CM

## 2021-05-21 DIAGNOSIS — G629 Polyneuropathy, unspecified: Secondary | ICD-10-CM

## 2021-05-21 DIAGNOSIS — K5900 Constipation, unspecified: Secondary | ICD-10-CM | POA: Diagnosis not present

## 2021-05-21 DIAGNOSIS — R519 Headache, unspecified: Secondary | ICD-10-CM

## 2021-05-21 MED ORDER — B-12 1000 MCG PO CAPS: 1000.0000 ug | ORAL_CAPSULE | Freq: Every day | ORAL | 1 refills | Status: AC

## 2021-05-21 MED ORDER — LACTULOSE 10 GM/15ML PO SOLN: 10.0000 g | Freq: Every day | ORAL | 6 refills | Status: DC

## 2021-05-21 MED ORDER — AMLODIPINE BESYLATE 2.5 MG PO TABS: 2.5000 mg | ORAL_TABLET | Freq: Every day | ORAL | 1 refills | Status: DC

## 2021-05-21 NOTE — Patient Instructions (Addendum)
Start taking vitamin b12 1020mcg daily Add back amlodipine 2.5mg  daily Refilled lactulose  Schedule nurse visit to recheck BP in a week or so Follow up with me in 6 weeks to see how your neuropathy is doing  Be well, Dr. Ardelia Mems    Vitamin B12 Deficiency Vitamin B12 deficiency occurs when the body does not have enough vitamin B12, which is an important vitamin. The body needs this vitamin:  To make red blood cells.  To make DNA. This is the genetic material inside cells.  To help the nerves work properly so they can carry messages from the brain to the body. Vitamin B12 deficiency can cause various health problems, such as a low red blood cell count (anemia) or nerve damage. What are the causes? This condition may be caused by:  Not eating enough foods that contain vitamin B12.  Not having enough stomach acid and digestive fluids to properly absorb vitamin B12 from the food that you eat.  Certain digestive system diseases that make it hard to absorb vitamin B12. These diseases include Crohn's disease, chronic pancreatitis, and cystic fibrosis.  A condition in which the body does not make enough of a protein (intrinsic factor), resulting in too few red blood cells (pernicious anemia).  Having a surgery in which part of the stomach or small intestine is removed.  Taking certain medicines that make it hard for the body to absorb vitamin B12. These medicines include: ? Heartburn medicines (antacids and proton pump inhibitors). ? Certain antibiotic medicines. ? Some medicines that are used to treat diabetes, tuberculosis, gout, or high cholesterol. What increases the risk? The following factors may make you more likely to develop a B12 deficiency:  Being older than age 49.  Eating a vegetarian or vegan diet, especially while you are pregnant.  Eating a poor diet while you are pregnant.  Taking certain medicines.  Having alcoholism. What are the signs or symptoms? In  some cases, there are no symptoms of this condition. If the condition leads to anemia or nerve damage, various symptoms can occur, such as:  Weakness.  Fatigue.  Loss of appetite.  Weight loss.  Numbness or tingling in your hands and feet.  Redness and burning of the tongue.  Confusion or memory problems.  Depression.  Sensory problems, such as color blindness, ringing in the ears, or loss of taste.  Diarrhea or constipation.  Trouble walking. If anemia is severe, symptoms can include:  Shortness of breath.  Dizziness.  Rapid heart rate (tachycardia). How is this diagnosed? This condition may be diagnosed with a blood test to measure the level of vitamin B12 in your blood. You may also have other tests, including:  A group of tests that measure certain characteristics of blood cells (complete blood count, CBC).  A blood test to measure intrinsic factor.  A procedure where a thin tube with a camera on the end is used to look into your stomach or intestines (endoscopy). Other tests may be needed to discover the cause of B12 deficiency. How is this treated? Treatment for this condition depends on the cause. This condition may be treated by:  Changing your eating and drinking habits, such as: ? Eating more foods that contain vitamin B12. ? Drinking less alcohol or no alcohol.  Getting vitamin B12 injections.  Taking vitamin B12 supplements. Your health care provider will tell you which dosage is best for you. Follow these instructions at home: Eating and drinking  Eat lots of healthy foods that  contain vitamin B12, including: ? Meats and poultry. This includes beef, pork, chicken, [place], and organ meats, such as liver. ? Seafood. This includes clams, rainbow trout, salmon, tuna, and haddock. ? Eggs. ? Cereal and dairy products that are fortified. This means that vitamin B12 has been added to the food. Check the label on the package to see if the food is  fortified. The items listed above may not be a complete list of recommended foods and beverages. Contact a dietitian for more information.   General instructions  Get any injections that are prescribed by your health care provider.  Take supplements only as told by your health care provider. Follow the directions carefully.  Do not drink alcohol if your health care provider tells you not to. In some cases, you may only be asked to limit alcohol use.  Keep all follow-up visits as told by your health care provider. This is important. Contact a health care provider if:  Your symptoms come back. Get help right away if you:  Develop shortness of breath.  Have a rapid heart rate.  Have chest pain.  Become dizzy or lose consciousness. Summary  Vitamin B12 deficiency occurs when the body does not have enough vitamin B12.  The main causes of vitamin B12 deficiency include dietary deficiency, digestive diseases, pernicious anemia, and having a surgery in which part of the stomach or small intestine is removed.  In some cases, there are no symptoms of this condition. If the condition leads to anemia or nerve damage, various symptoms can occur, such as weakness, shortness of breath, and numbness.  Treatment may include getting vitamin B12 injections or taking vitamin B12 supplements. Eat lots of healthy foods that contain vitamin B12. This information is not intended to replace advice given to you by your health care provider. Make sure you discuss any questions you have with your health care provider. Document Revised: 06/03/2019 Document Reviewed: 08/24/2018 Elsevier Patient Education  2021 [employer].

## 2021-05-21 NOTE — Assessment & Plan Note (Addendum)
Headaches have been stable in interim with reassuring MRI Brain on 04/28/21. Pt continues to be woken up by headaches during the night but some relief endorsed with Tylenol. No further workup unless nature of headache changes. repleting b12 which will hopefully help with neuropathy symptoms; if not will refer to neuro.

## 2021-05-21 NOTE — Progress Notes (Signed)
    SUBJECTIVE:   CHIEF COMPLAINT / HPI:   Jennifer Moss returns today for follow up.  Headaches Jennifer Moss notes that her headaches have been stable in the last two months. She continues to be woken up at night by sharp pain in the back of her head that improves with Tylenol. She had a Brain MRI in the interim which was unchanged from previous and didn't reveal an explanatory etiology. She is reassured by the negative MRI results.  Polyneuropathy Jennifer Moss continues to have stable tingling and numbness in both arms and in her right toes. Labs in the interim showed low B12 (and Vit D).  Blood Pressure Jennifer Moss's BP was overly controlled at last visit, with orthostatic light headedness, and so her Amlodipine was decreased to 5mg , while continuing on 10mg  Lisinopril. Jennifer Moss BP was still overly controlled at a subsequent Nephrology visit in the interim and so her Amlodipine was held through today. BP log shows some readings since then with systolics >761 and DBPs >95, including today's clinic reading at 144/92.   PERTINENT  PMH / PSH:  Chronic Headaches CKD HTN  OBJECTIVE:   BP (!) 144/92   Pulse 66   Wt 129 lb 12.8 oz (58.9 kg)   SpO2 97%   BMI 25.35 kg/m   Physical Exam Constitutional:      Appearance: Normal appearance.  Cardiovascular:     Rate and Rhythm: Normal rate and regular rhythm.     Pulses: Normal pulses.     Heart sounds: Normal heart sounds.  Pulmonary:     Effort: Pulmonary effort is normal.     Breath sounds: Normal breath sounds.  Skin:    General: Skin is warm and dry.  Neurological:     Mental Status: She is alert and oriented to person, place, and time.  Psychiatric:        Mood and Affect: Mood normal.        Behavior: Behavior normal.      ASSESSMENT/PLAN:   Headache Headaches have been stable in interim with reassuring MRI Brain on 04/28/21. Pt continues to be woken up by headaches during the night but some relief endorsed with Tylenol. No  further workup unless nature of headache changes. repleting b12 which will hopefully help with neuropathy symptoms; if not will refer to neuro.  Polyneuropathy Polyneuropathy in setting of newly elucidated B12 deficiency. Begin PO 1039mcg B12 daily Will recheck repletion status at next visit  Hypertension Nephrology SBP goal <140 per pt.  Restart Amlodipine but at lower dose of 2.5mg  daily Continue 10mg  Lisinopril RTC in 1 week for nurse visit BP check  Constipation Stable, refill lactulose     London Pepper, [place]     Patient seen along with MS[employer] student Baldwin [place]. I personally evaluated this patient along with the student, and verified all aspects of the history, physical exam, and medical decision making as documented by the student. I agree with the student's documentation and have made all necessary edits.  Chrisandra Netters, MD  [place]

## 2021-05-21 NOTE — Assessment & Plan Note (Signed)
Nephrology SBP goal <140 per pt.  Restart Amlodipine but at lower dose of 2.5mg  daily Continue 10mg  Lisinopril RTC in 1 week for nurse visit BP check

## 2021-05-21 NOTE — Assessment & Plan Note (Addendum)
Polyneuropathy in setting of newly elucidated B12 deficiency. Begin PO 1069mcg B12 daily Will recheck repletion status at next visit

## 2021-05-22 LAB — HOMOCYSTEINE: Homocysteine: 46.9 umol/L — ABNORMAL HIGH (ref 0.0–17.2)

## 2021-05-22 LAB — FOLATE: Folate: 4.9 ng/mL (ref >3.0)

## 2021-05-22 LAB — METHYLMALONIC ACID, SERUM: Methylmalonic Acid: 371 nmol/L (ref 0–378)

## 2021-05-27 NOTE — Assessment & Plan Note (Signed)
Stable, refill lactulose

## 2021-05-29 ENCOUNTER — Other Ambulatory Visit: Payer: Self-pay

## 2021-05-29 ENCOUNTER — Ambulatory Visit (INDEPENDENT_AMBULATORY_CARE_PROVIDER_SITE_OTHER): Payer: Medicare Other

## 2021-05-29 VITALS — BP 136/104 | HR 82

## 2021-05-29 DIAGNOSIS — Z0130 Encounter for examination of blood pressure without abnormal findings: Secondary | ICD-10-CM

## 2021-05-29 NOTE — Progress Notes (Signed)
Patient here today for BP check.      Last BP was on 05/21/2021 and was 144/92.  BP today is 136/104. Patient reports home  with a pulse of 82.  BP this AM of 140/100  Checked BP in left arm with normal adult cuff.    Symptoms present: none.  Patient reports delay in starting amlodipine. Reports that she took first dose yesterday (5/31) evening.   Precepted with Dr. Andria Frames. Patient will come back in one week for nurse BP check, as to give medication time to work. Provided patient with strict return/ED precautions.    Routed note to PCP.         Talbot Grumbling, RN

## 2021-06-04 ENCOUNTER — Other Ambulatory Visit: Payer: Self-pay

## 2021-06-04 ENCOUNTER — Ambulatory Visit (INDEPENDENT_AMBULATORY_CARE_PROVIDER_SITE_OTHER): Payer: Medicare Other

## 2021-06-04 VITALS — BP 132/100 | HR 84

## 2021-06-04 DIAGNOSIS — Z0130 Encounter for examination of blood pressure without abnormal findings: Secondary | ICD-10-CM

## 2021-06-04 NOTE — Patient Instructions (Addendum)
It was nice to see you today! Your BP is still elevated during our visit today. I spoke with one of our supervising providers, Dr. Owens Shark, who recommends that you increase your amlodipine to 5 mg (this will be 2 tablets of the 2.5 mg tablets) at bedtime.   I have also scheduled you an appointment with Ernst Bowler, one of our clinical pharmacist on 6/22 at 9:00 am for additional follow up on your BP.   If you begin having symptoms of high blood pressure (headache, blurry vision or chest pain) please let us know immediately. Also if you begin having dizziness or lightheadedness, which can be signs of low BP.   Please continue to check blood pressure daily or if you begin having any of the above listed symptoms. Keep a log of your BP and bring it with you to your next visit.   Please call our office at [phone] if you have any additional questions or concerns.   Have a nice day!  Talbot Grumbling, RN

## 2021-06-04 NOTE — Progress Notes (Signed)
Patient here today for BP check.      Last BP was on 05/29/2021 and was 136/104.  BP today is 132/100 with a pulse of 84.    Checked BP in left arm with adult regular cuff.    Symptoms present: none.   Precepted with Dr. Owens Shark. Recommended that patient increase to 5 mg amlodipine daily and follow up with Rachelle in two weeks. Scheduled patient follow up visit with Rachelle on 6/22.  Patient last took BP meds yesterday evening.  Patient verbalizes understanding of current plan of care. ED precautions given.      Routed note to PCP.         Talbot Grumbling, RN

## 2021-06-18 ENCOUNTER — Other Ambulatory Visit: Payer: Self-pay

## 2021-06-18 ENCOUNTER — Ambulatory Visit (INDEPENDENT_AMBULATORY_CARE_PROVIDER_SITE_OTHER): Payer: Medicare Other | Admitting: Pharmacist

## 2021-06-18 VITALS — BP 104/79 | HR 68

## 2021-06-18 DIAGNOSIS — I10 Essential (primary) hypertension: Secondary | ICD-10-CM | POA: Diagnosis not present

## 2021-06-18 NOTE — Patient Instructions (Signed)
Jennifer Moss it was a pleasure seeing you today.   Your blood pressure today is well-controlled but we will place a 24 hour blood pressure monitor on to obtain more details.  Continue taking blood pressure medications as prescribed.   Take your blood pressure at home if you are able. Please write down these numbers and bring them to your visits.  If you have any questions please call me.  I will tentatively put you down to place the monitor on Monday morning 6/27 @ 8:30 and return Tuesday 6/28 @ 8:30.

## 2021-06-18 NOTE — Progress Notes (Signed)
   Subjective:    Patient ID: Jennifer Moss, female    DOB: 1951/03/30, 70 y.o.   MRN: [id number]  HPI Presents to the clinic for hypertension evaluation, counseling, and management.  Patient was referred on 06/04/21.  Patient was last seen by Primary Care Provider on 05/21/21.   Current BP Medications include:  lisinopril 10mg , amlodipine 5mg   Objective:   Hypertension ROS: taking medications as instructed, no medication side effects noted, home BP monitoring in range of 094-709'G systolic over 28-36'O diastolic, no swelling of ankles, does note some orthostatic dizziness or lightheadedness, and does note some dizziness when arising.   New concerns: Patient reports that when amlodipine was decreased to 2.5mg  she felt worse and that she experienced more headaches. She also notes that her home blood pressure was elevated. Since increasing her amlodipine back to 5mg  she states her blood pressure improved and she felt better. She notes the dizziness she has been experiencing is not worsened with the increase in amlodipine.   Appearance alert, well appearing, and in no distress. General exam BP noted to be well controlled today in office.  Lab review: labs reviewed, I note that renal functions abnormal but stable compared to previous labs from the year prior.   Labs:   Last 3 Office BP readings:  BP Readings from Last 3 Encounters:  06/18/21 104/79  06/04/21 (!) 132/100  05/29/21 (!) 136/104   BMET:    Component Value Date/Time   NA 142 04/23/2021 1217   K 4.3 04/23/2021 1217   CL 102 04/23/2021 1217   CO2 16 (L) 04/23/2021 1217   GLUCOSE 96 04/23/2021 1217   GLUCOSE 122 (H) 04/19/2020 1220   BUN 19 04/23/2021 1217   CREATININE 1.90 (H) 04/23/2021 1217   CREATININE 1.76 (H) 09/22/2016 0955   CALCIUM 9.5 04/23/2021 1217   GFRNONAA 31 (L) 05/15/2020 1004   GFRNONAA 31 (L) 09/22/2016 0955   GFRAA 36 (L) 05/15/2020 1004   GFRAA 35 (L) 09/22/2016 0955   Clinical Atherosclerotic  Cardiovascular Disease (ASCVD): No  The 10-year ASCVD risk score Mikey Bussing DC Jr., et al., 2013) is: 11%   Values used to calculate the score:     Age: 89 years     Sex: Female     Is Non-Hispanic African American: Yes     Diabetic: No     Tobacco smoker: No     Systolic Blood Pressure: 294 mmHg     Is BP treated: Yes     HDL Cholesterol: 75 mg/dL     Total Cholesterol: 210 mg/dL    Assessment/Plan:   Hypertension well controlled based on blood pressure today but recommend 24 hour ambulatory blood pressure monitor due to patient's history of symptomatic hypotension. Will determine changes in antihypertensive therapy based upon results of test.   Plan:  Current treatment plan is effective, no change in therapy. Follow up: 1 week for ambulatory blood pressure monitor and as needed.  This appointment required 30 minutes of patient care (this includes precharting, chart review, review of results, and face-to-face care).  Thank you for involving pharmacy to assist in providing this patient's care.

## 2021-06-19 ENCOUNTER — Ambulatory Visit: Payer: Medicare Other | Admitting: Pharmacist

## 2021-06-24 ENCOUNTER — Other Ambulatory Visit: Payer: Self-pay

## 2021-06-24 ENCOUNTER — Ambulatory Visit (INDEPENDENT_AMBULATORY_CARE_PROVIDER_SITE_OTHER): Payer: Medicare Other | Admitting: Pharmacist

## 2021-06-24 VITALS — BP 125/80

## 2021-06-24 DIAGNOSIS — I10 Essential (primary) hypertension: Secondary | ICD-10-CM

## 2021-06-24 NOTE — Progress Notes (Signed)
   S:    Patient arrives in good spirits. Presents to the clinic for ambulatory blood pressure evaluation.   Patient was referred on 06/04/21.  Patient was last seen by Primary Care Provider on 05/21/21 and in pharmacy clinic on 06/18/21.  Patient reports having symptomatic hypotension but all home blood pressure readings were within goal of 562-563'S systolic and 93-73'S diastolic.   Medication compliance is reported to be optimal.  Discussed procedure for wearing the monitor and gave patient written instructions. Monitor was placed on non-dominant arm with instructions to return in the morning.   Current BP Medications include:  lisinopril 10mg , amlodipine 5mg   O:  Physical Exam Musculoskeletal:        General: No swelling.     Right lower leg: No edema.     Left lower leg: No edema.  Neurological:     Mental Status: She is oriented to person, place, and time.  Psychiatric:        Mood and Affect: Mood normal.    Last 3 Office BP readings: BP Readings from Last 3 Encounters:  06/24/21 125/80  06/18/21 104/79  06/04/21 (!) 132/100   Clinical Atherosclerotic Cardiovascular Disease (ASCVD): No  The 10-year ASCVD risk score Mikey Bussing DC Jr., et al., 2013) is: 9.8%   Values used to calculate the score:     Age: 70 years     Sex: Female     Is Non-Hispanic African American: Yes     Diabetic: No     Tobacco smoker: No     Systolic Blood Pressure: 287 mmHg     Is BP treated: Yes     HDL Cholesterol: 75 mg/dL     Total Cholesterol: 210 mg/dL  Basic Metabolic Panel    Component Value Date/Time   NA 142 04/23/2021 1217   K 4.3 04/23/2021 1217   CL 102 04/23/2021 1217   CO2 16 (L) 04/23/2021 1217   GLUCOSE 96 04/23/2021 1217   GLUCOSE 122 (H) 04/19/2020 1220   BUN 19 04/23/2021 1217   CREATININE 1.90 (H) 04/23/2021 1217   CREATININE 1.76 (H) 09/22/2016 0955   CALCIUM 9.5 04/23/2021 1217   GFRNONAA 31 (L) 05/15/2020 1004   GFRNONAA 31 (L) 09/22/2016 0955   GFRAA 36 (L)  05/15/2020 1004   GFRAA 35 (L) 09/22/2016 0955    Today's Office Blood Pressure (BP) reading: 125/80 mmHg (manual reading)   A/P: History of hypertension set-up with 24-hour ambulatory blood pressure. Currently taking multiple drug regimen. Educated on details for use of amb BP monitor.  Patient verbalized understanding of plan and will return 6/28 in AM.   Results reviewed and written information provided.  Total time in face-to-face counseling 15 minutes.   F/U Clinic Visit tomorrow for BP monitor interpretation.

## 2021-06-24 NOTE — Patient Instructions (Addendum)
Blood Pressure Activity Diary Time Lying down/ Sleeping Walking/ Exercise Stressed/ Angry Headache/ Pain Dizzy  9 AM       10 AM       11 AM       12 PM       1 PM       2 PM       Time Lying down/ Sleeping Walking/ Exercise Stressed/ Angry Headache/ Pain Dizzy  3 PM       4 PM        5 PM       6 PM       7 PM       8 PM       Time Lying down/ Sleeping Walking/ Exercise Stressed/ Angry Headache/ Pain Dizzy  9 PM       10 PM       11 PM       12 AM       1 AM       2 AM       3 AM       Time Lying down/ Sleeping Walking/ Exercise Stressed/ Angry Headache/ Pain Dizzy  4 AM       5 AM       6 AM       7 AM       8 AM       9 AM       10 AM        Time you woke up: _________                  Time you went to sleep:__________  Come back tomorrow at 8:30 AM to have the monitor removed Call the [facility] if you have any questions before then ([phone])  Wearing the Blood Pressure Monitor The cuff will inflate every 20 minutes during the day and every 30 minutes while you sleep. Your blood pressure readings will NOT display after cuff inflation Fill out the blood pressure-activity diary during the day, especially during activities that may affect your reading -- such as exercise, stress, walking, taking your blood pressure medications  Important things to know: Avoid taking the monitor off for the next 24 hours, unless it causes you discomfort or pain. Do NOT get the monitor wet and do NOT dry to clean the monitor with any cleaning products. Do NOT put the monitor on anyone else's arm. When the cuff inflates, avoid excess movement. Let the cuffed arm hang loosely, slightly away from the body. Avoid flexing the muscles or moving the hand/fingers. When you go to sleep, make sure that the hose is not kinked. Remember to fill out the blood pressure activity diary. If you experience severe pain or unusual pain (not associated with getting your blood pressure  checked), remove the monitor.  Troubleshooting:  Code  Troubleshooting   1  Check cuff position, tighten cuff   2, 3  Remain still during reading   4, 87  Check air hose connections and make sure cuff is tight   85, 89  Check hose connections and make tubing is not crimped   86  Push START/STOP to restart reading   88, 91  Retry by pushing START/STOP   90  Replace batteries. If problem persists, remove monitor and bring back to   clinic at follow up   97, 98, 99  Service required - Remove monitor and bring back to clinic   at follow up    

## 2021-06-25 ENCOUNTER — Ambulatory Visit (INDEPENDENT_AMBULATORY_CARE_PROVIDER_SITE_OTHER): Payer: Medicare Other | Admitting: Pharmacist

## 2021-06-25 ENCOUNTER — Ambulatory Visit: Payer: Medicare Other | Admitting: Pharmacist

## 2021-06-25 DIAGNOSIS — I10 Essential (primary) hypertension: Secondary | ICD-10-CM | POA: Diagnosis not present

## 2021-06-25 NOTE — Patient Instructions (Signed)
Jennifer Moss it was a pleasure seeing you today.   Your blood pressure today is well-controlled .   STOP amlodipine 5mg  taking blood pressure medications as prescribed.   Take your blood pressure at home if you are able. Please write down these numbers and bring them to your visits.  If you have any questions please call me at [phone].

## 2021-06-25 NOTE — Progress Notes (Signed)
S:    Patient arrives in good spirits. Patient returns to office with amb BP monitor in place. Patient was last seen by Primary Care Provider on 05/21/21 and in pharmacy clinic on 06/18/21.  Patient has a history of symptomatic hypotension in which she has had blood pressure medications recently changed. Previously her amlodipine was held and then blood pressure was elevated in clinic therefore amlodipine was added back on at 2.5mg . Patient reported feeling worse and her blood pressure continued to remain elevated resulting in amlodipine being increased back up to 5mg . She states she woke up frequently throughout the night while wearing the ambulatory blood pressure monitor due to it inflating to obtain readings.  Medication compliance is reported to be optimal.  Current BP Medications include:  lisinopril 10mg , amlodipine 5mg   O:  Physical Exam Constitutional:      Appearance: Normal appearance.  Neurological:     Mental Status: She is alert and oriented to person, place, and time.  Psychiatric:        Mood and Affect: Mood normal.    Last 3 Office BP readings: BP Readings from Last 3 Encounters:  06/24/21 125/80  06/18/21 104/79  06/04/21 (!) 132/100    Clinical Atherosclerotic Cardiovascular Disease (ASCVD): No  The 10-year ASCVD risk score Mikey Bussing DC Jr., et al., 2013) is: 9.8%   Values used to calculate the score:     Age: 70 years     Sex: Female     Is Non-Hispanic African American: Yes     Diabetic: No     Tobacco smoker: No     Systolic Blood Pressure: 397 mmHg     Is BP treated: Yes     HDL Cholesterol: 75 mg/dL     Total Cholesterol: 210 mg/dL  Basic Metabolic Panel    Component Value Date/Time   NA 142 04/23/2021 1217   K 4.3 04/23/2021 1217   CL 102 04/23/2021 1217   CO2 16 (L) 04/23/2021 1217   GLUCOSE 96 04/23/2021 1217   GLUCOSE 122 (H) 04/19/2020 1220   BUN 19 04/23/2021 1217   CREATININE 1.90 (H) 04/23/2021 1217   CREATININE 1.76 (H) 09/22/2016  0955   CALCIUM 9.5 04/23/2021 1217   GFRNONAA 31 (L) 05/15/2020 1004   GFRNONAA 31 (L) 09/22/2016 0955   GFRAA 36 (L) 05/15/2020 1004   GFRAA 35 (L) 09/22/2016 0955    ABPM Study Data: Arm Placement left arm  Overall Mean 24hr BP:   112/74 mmHg HR: 84  Daytime Mean BP:  114/75 mmHg HR: 87  Nighttime Mean BP:  108/72 mmHg HR: 80  Dipping Pattern: Yes.    Sys:   5.2%  Dia: 3.0%   [normal dipping ~10-20%]  Non-hypertensive ABPM thresholds: daytime BP <125/75 mmHg, sleeptime BP <120/70 mmHg    A/P: Longstanding hypertension likely overly controlled as ambulatory blood pressure monitor demonstrates hypotension throughout daytime with an average blood pressure of 112/74 mmHg, and a nocturnal dipping pattern that is  less than ideal with a dipping pattern <10%,  however, cause is likely lack of adequate sleep throughout the night to properly assess . Will stop amlodipine 5mg  and continue to monitor blood pressure in clinic. If blood pressure continues to remain elevated can place 24 hour ambulatory blood pressure monitor back on patient for further assessment.   Results reviewed and discussed with Dr. McDiarmid. Written information provided.  Total time in face-to-face counseling 30 minutes.   F/U Clinic Visit with Dr. Ardelia Mems on 07/09/21.

## 2021-06-28 DIAGNOSIS — H2521 Age-related cataract, morgagnian type, right eye: Secondary | ICD-10-CM | POA: Diagnosis not present

## 2021-07-09 ENCOUNTER — Ambulatory Visit (INDEPENDENT_AMBULATORY_CARE_PROVIDER_SITE_OTHER): Payer: Medicare Other | Admitting: Family Medicine

## 2021-07-09 ENCOUNTER — Encounter: Payer: Self-pay | Admitting: Family Medicine

## 2021-07-09 ENCOUNTER — Other Ambulatory Visit: Payer: Self-pay

## 2021-07-09 VITALS — BP 120/80 | HR 71 | Wt 125.4 lb

## 2021-07-09 DIAGNOSIS — I10 Essential (primary) hypertension: Secondary | ICD-10-CM

## 2021-07-09 DIAGNOSIS — G629 Polyneuropathy, unspecified: Secondary | ICD-10-CM | POA: Diagnosis not present

## 2021-07-09 DIAGNOSIS — E538 Deficiency of other specified B group vitamins: Secondary | ICD-10-CM | POA: Diagnosis not present

## 2021-07-09 MED ORDER — LISINOPRIL 20 MG PO TABS: 20.0000 mg | ORAL_TABLET | Freq: Every day | ORAL | 0 refills | Status: DC

## 2021-07-09 MED ORDER — SHINGRIX 50 MCG/0.5ML IM SUSR: 0.5000 mL | Freq: Once | INTRAMUSCULAR | 1 refills | Status: AC

## 2021-07-09 NOTE — Assessment & Plan Note (Addendum)
BP at goal today in clinic Continue current medication regimen (10 mg lisinopril, no amlodipine) Will check BMP to assess kidney function

## 2021-07-09 NOTE — Progress Notes (Signed)
    SUBJECTIVE:   CHIEF COMPLAINT / HPI:   Blood pressure Ms. Laverdiere notes that her elevated blood pressure has been coming back. She can tell when it is elevated above 264 systolic based on how she feels. It is distressing to her but does not affect her daily activities. Ms. Maillet shares she stopped taking amlodipine  (previously 5mg  daily) per recommendation from pharmacy last week following a 24 hour blood pressure monitor that showed transient elevations. The pharmacist said she should talk to PCP before restarting amlodipine. She clarifies that blood pressure control was poor prior to the monitor. This is her primary concern and wants to do what she can to feel better and is especially concerned with an elevated diastolic  number above 90 given her kidneys.  Polyneuropathy Ms. Cuthbert notes ongoing occasional stable tingling in left arm and right toes. She is still taking B12 and Vit D daily as prescribed.  Sleep Ms. Boehlke noted difficulty in going to sleep and staying asleep. She doesn't note anything different from previous time she raised concern. She noted she does not want to take medications but is not sure what else to try. Has not tried white noise machine.  PERTINENT  PMH / PSH:  Chronic headaches CKD HTN  OBJECTIVE:   BP 120/80   Pulse 71   Wt 125 lb 6.4 oz (56.9 kg)   SpO2 100%   BMI 24.49 kg/m    GEN: Well-appearing, no acute distress CARD: Slightly elevated rate, normal rhythm. No murmurs PULM: Effort is normal. Normal breath sounds bilaterally SKIN: Warm and dry NEUR: Oriented to person, place and time PSYCH: Mood and affect normal.  ASSESSMENT/PLAN:   Hypertension Continue current medication regimen (10 mg lisinopril, no amlodipine) Will check BMP to assess kidney function  Polyneuropathy Continue daily B12 1000 mg PO Will recheck B12 levels today   Sleep Encouraged Ms. Fichera to get an inexpensive white noise machine/utilize apps that have white  noise Provided sleep hygiene handout to encourage beneficial sleep practices  Health maintenance Given rx for shingles vaccine to take to her pharmacy  Follow up in 1 month for BP check  [place]   Patient seen along with MS3 student Cristobal Goldmann. I personally evaluated this patient along with the student, and verified all aspects of the history, physical exam, and medical decision making as documented by the student. I agree with the student's documentation and have made all necessary edits.  Chrisandra Netters, MD  [place]

## 2021-07-09 NOTE — Assessment & Plan Note (Addendum)
Continue daily B12 1000 mg PO Will recheck B12 levels today

## 2021-07-09 NOTE — Patient Instructions (Addendum)
It was great to see you again today!  Stay on current blood pressure medications  Follow up in 1 month to recheck blood pressure again  Take shingles vaccine to your pharmacy  See handout on sleep hygiene  Be well, Dr. Ardelia Mems

## 2021-07-10 LAB — BASIC METABOLIC PANEL
BUN/Creatinine Ratio: 10 — ABNORMAL LOW (ref 12–28)
BUN: 17 mg/dL (ref 8–27)
CO2: 16 mmol/L — ABNORMAL LOW (ref 20–29)
Calcium: 9.8 mg/dL (ref 8.7–10.3)
Chloride: 105 mmol/L (ref 96–106)
Creatinine, Ser: 1.64 mg/dL — ABNORMAL HIGH (ref 0.57–1.00)
Glucose: 95 mg/dL (ref 65–99)
Potassium: 4.7 mmol/L (ref 3.5–5.2)
Sodium: 146 mmol/L — ABNORMAL HIGH (ref 134–144)
eGFR: 34 mL/min/{1.73_m2} — ABNORMAL LOW (ref >59)

## 2021-07-10 LAB — VITAMIN B12: Vitamin B-12: 1157 pg/mL (ref 232–1245)

## 2021-08-20 ENCOUNTER — Encounter: Payer: Self-pay | Admitting: Family Medicine

## 2021-08-20 ENCOUNTER — Other Ambulatory Visit: Payer: Self-pay

## 2021-08-20 ENCOUNTER — Ambulatory Visit (INDEPENDENT_AMBULATORY_CARE_PROVIDER_SITE_OTHER): Payer: Medicare Other | Admitting: Family Medicine

## 2021-08-20 VITALS — BP 125/82 | HR 89 | Ht 60.0 in | Wt 130.0 lb

## 2021-08-20 DIAGNOSIS — E785 Hyperlipidemia, unspecified: Secondary | ICD-10-CM | POA: Diagnosis not present

## 2021-08-20 DIAGNOSIS — E559 Vitamin D deficiency, unspecified: Secondary | ICD-10-CM | POA: Diagnosis not present

## 2021-08-20 DIAGNOSIS — Q8501 Neurofibromatosis, type 1: Secondary | ICD-10-CM

## 2021-08-20 DIAGNOSIS — I10 Essential (primary) hypertension: Secondary | ICD-10-CM

## 2021-08-20 NOTE — Assessment & Plan Note (Addendum)
Likely contributing to neuropathic symptoms Presently tolerating okay, continue to monitor.

## 2021-08-20 NOTE — Assessment & Plan Note (Signed)
Currently taking 4000 units Vit D daily Checking Vit D levels today, last checked 04/23/21

## 2021-08-20 NOTE — Assessment & Plan Note (Signed)
Update lipid panel today 

## 2021-08-20 NOTE — Assessment & Plan Note (Addendum)
Well controlled, BP in office today was 125/82. Continue present medications

## 2021-08-20 NOTE — Patient Instructions (Signed)
It was great to see you again today!  Checking cholesterol and vitamin D level today  Blood pressure looks great  Follow up with me in 3 months   Be well, Dr. Ardelia Mems

## 2021-08-20 NOTE — Progress Notes (Signed)
    SUBJECTIVE:   CHIEF COMPLAINT / HPI:   Blood pressure - In office BP was 125/82. She denies any dizziness when standing, headaches or blurry vision. This is an improvement from last visit. Currently taking lisinopril '10mg'$  daily.  Vit D - Jennifer Moss started taking 4000 units Vit D, a change from 5000 mg Vit D per discussion last visit. She wants to make sure that the change is still working. She doesn't have a preference on whether blood levels are checked. She has not noticed any physical changes with the dose adjustment.  Nerve pain - Jennifer Moss figured out that the nerve pain that has been bothering her was her neurofibromatosis starting in her arm. It hasn't been as bothersome as before and isn't preventing her from doing anything. It comes and goes like her other pain with a slight tingling sensation.  Health Maintenance - Jennifer Moss has started working out on weekdays with a friend and is doing a mix of cardio and stretching. She feels stronger and more energetic exercising and is glad she is doing it.   PERTINENT  PMH / PSH:  HTN Asthma Neurofibromatosis CKD stage 3 Constpation Prolonged QT interval  OBJECTIVE:   BP 125/82   Pulse 89   Ht 5' (1.524 m)   Wt 130 lb (59 kg)   SpO2 97%   BMI 25.39 kg/m   GEN: Well appearing, in no acute distress SKIN: Warm, dry CARD: Normal rate and rhythm PULM: Normal work of breathing, clear to auscultation NEURO: Grossly normal   ASSESSMENT/PLAN:   Hypertension Well controlled, BP in office today was 125/82. Continue present medications   Neurofibromatosis, type 1 ([place]) Likely contributing to neuropathic symptoms Presently tolerating okay, continue to monitor.  Vitamin D deficiency Currently taking 4000 units Vit D daily Checking Vit D levels today, last checked 04/23/21  Hyperlipidemia Update lipid panel today  Health Maintenance  UTD on preventive care other than influenza vaccine which is not yet available Follow up in  3 months  Jennifer Moss, [place]    Patient seen along with MS3 student Jennifer Moss. I personally evaluated this patient along with the student, and verified all aspects of the history, physical exam, and medical decision making as documented by the student. I agree with the student's documentation and have made all necessary edits.  Chrisandra Netters, MD  [place]

## 2021-08-21 ENCOUNTER — Encounter: Payer: Self-pay | Admitting: Family Medicine

## 2021-08-21 LAB — LIPID PANEL
Chol/HDL Ratio: 2.8 ratio (ref 0.0–4.4)
Cholesterol, Total: 168 mg/dL (ref 100–199)
HDL: 59 mg/dL (ref >39)
LDL Chol Calc (NIH): 86 mg/dL (ref 0–99)
Triglycerides: 133 mg/dL (ref 0–149)
VLDL Cholesterol Cal: 23 mg/dL (ref 5–40)

## 2021-08-21 LAB — VITAMIN D 25 HYDROXY (VIT D DEFICIENCY, FRACTURES): Vit D, 25-Hydroxy: 91.3 ng/mL (ref 30.0–100.0)

## 2021-08-23 ENCOUNTER — Encounter: Payer: Self-pay | Admitting: *Deleted

## 2021-08-27 ENCOUNTER — Ambulatory Visit (INDEPENDENT_AMBULATORY_CARE_PROVIDER_SITE_OTHER): Payer: Medicare Other

## 2021-08-27 ENCOUNTER — Other Ambulatory Visit: Payer: Self-pay

## 2021-08-27 VITALS — BP 130/96 | HR 83

## 2021-08-27 DIAGNOSIS — Z0130 Encounter for examination of blood pressure without abnormal findings: Secondary | ICD-10-CM

## 2021-08-27 NOTE — Progress Notes (Signed)
Patient presents to nurse clinic for BP check. Patient reports that BP has been elevated today, with most recent reading of 150/120. Patient does report having headache this morning. Patient also reports intermittent palpitations. Denies blurry vision or chest pain.   Today in office, BP was 130/96, HR: 83.  Precepted with Dr. Andria Frames. No new orders at this time. Advised that patient schedule for next available appointment for further evaluation.   Scheduled patient for tomorrow afternoon with Dr. Arby Barrette. Provided with strict ED precautions.   Talbot Grumbling, RN

## 2021-08-28 ENCOUNTER — Ambulatory Visit (INDEPENDENT_AMBULATORY_CARE_PROVIDER_SITE_OTHER): Payer: Medicare Other | Admitting: Family Medicine

## 2021-08-28 ENCOUNTER — Encounter: Payer: Self-pay | Admitting: Family Medicine

## 2021-08-28 ENCOUNTER — Other Ambulatory Visit: Payer: Self-pay

## 2021-08-28 VITALS — BP 116/94 | HR 87 | Ht 62.0 in | Wt 127.0 lb

## 2021-08-28 DIAGNOSIS — I10 Essential (primary) hypertension: Secondary | ICD-10-CM | POA: Diagnosis not present

## 2021-08-28 NOTE — Patient Instructions (Addendum)
It was great seeing you today!  Today you came in for a blood pressure check which was good at 116/94 and I am glad you are feeling well also. Continue the good work with the exercise and nutrition!   Return to see your PCP in November at your scheduled appointment for follow up, but if you need to be seen earlier than that for any new issues we're happy to fit you in, just give [place] a call!  Feel free to call with any questions or concerns at any time, at [phone].   Take care,  Dr. Shary Key Roy [facility]

## 2021-08-28 NOTE — Progress Notes (Signed)
    SUBJECTIVE:   CHIEF COMPLAINT / HPI:   Ms. Gallick is a 70 yo who presents for a BP check   Was seen on 8/23 and blood pressure is 125/82. Presented for nurse visit yesterday, 8/30 because she measured BP at home which was 150/120 and she endorsed headaches, and intermittent palpitations.  Denied chest pain or vision changes.  Blood pressure in the office was 130/96, and advised to return today for evaluation. Today BP 116/94 and she endorses feeling well and without CP, headaches, palpitations.   Endorses working out at [employer] doing cardio and Charity fundraiser Diet : Endorses eating balanced meals, and cooking about twice per week since nobody else is home. Will invite daughter over and cook for her. Eats salads frequently   PERTINENT  PMH / PSH:  HTN, Asthma, Neurofibromatosis, CKD stage 3  OBJECTIVE:   BP (!) 116/94   Pulse 87   Ht '5\' 2"'$  (1.575 m)   Wt 127 lb (57.6 kg)   SpO2 100%   BMI 23.23 kg/m    General: alert, pleasant, NAD CV: RRR no murmurs Resp: CTAB normal WOB GI: soft, non distended   ASSESSMENT/PLAN:   No problem-specific Assessment & Plan notes found for this encounter.   Hypertension  BP 116/94. Asymptomatic. Currently on Lisinopril '10mg'$ , but given labile BP can consider increasing dose. Patient to follow up with PCP at scheduled appt in Nov.   Hyperlipidemia Total cholesterol 210 and LDL 117 on 8/23.  - continue Lipitor '10mg'$  daily - continue exercise and healthy eating   [place]

## 2021-08-30 ENCOUNTER — Telehealth: Payer: Self-pay | Admitting: Family Medicine

## 2021-09-04 MED ORDER — LISINOPRIL 10 MG PO TABS: 10.0000 mg | ORAL_TABLET | Freq: Every day | ORAL | 1 refills | Status: DC

## 2021-09-04 NOTE — Telephone Encounter (Signed)
She can stay on the '10mg'$  tabs. I sent these in for her. Thanks, Leeanne Rio, MD

## 2021-09-04 NOTE — Telephone Encounter (Signed)
Can you clarify with patient what dose of this medication she is taking?  Thanks, Leeanne Rio, MD

## 2021-09-04 NOTE — Telephone Encounter (Signed)
Patient is currently taking 10 mg.  However, she does have some 20 mg's left if that is what you would like her to take.  Ozella Almond, [place]

## 2021-10-07 ENCOUNTER — Other Ambulatory Visit: Payer: Self-pay | Admitting: Family Medicine

## 2021-10-25 ENCOUNTER — Other Ambulatory Visit: Payer: Self-pay | Admitting: Family Medicine

## 2021-11-07 ENCOUNTER — Other Ambulatory Visit: Payer: Self-pay

## 2021-11-07 ENCOUNTER — Emergency Department (HOSPITAL_COMMUNITY): Payer: Medicare Other

## 2021-11-07 ENCOUNTER — Encounter (HOSPITAL_COMMUNITY): Payer: Self-pay | Admitting: Radiology

## 2021-11-07 ENCOUNTER — Emergency Department (HOSPITAL_COMMUNITY)
Admission: EM | Admit: 2021-11-07 | Discharge: 2021-11-07 | Disposition: A | Discharge status: Home / Self Care | Payer: Medicare Other | Attending: Emergency Medicine | Admitting: Emergency Medicine

## 2021-11-07 DIAGNOSIS — I129 Hypertensive chronic kidney disease with stage 1 through stage 4 chronic kidney disease, or unspecified chronic kidney disease: Secondary | ICD-10-CM | POA: Insufficient documentation

## 2021-11-07 DIAGNOSIS — M546 Pain in thoracic spine: Secondary | ICD-10-CM | POA: Insufficient documentation

## 2021-11-07 DIAGNOSIS — N183 Chronic kidney disease, stage 3 unspecified: Secondary | ICD-10-CM | POA: Insufficient documentation

## 2021-11-07 DIAGNOSIS — K5730 Diverticulosis of large intestine without perforation or abscess without bleeding: Secondary | ICD-10-CM | POA: Diagnosis not present

## 2021-11-07 DIAGNOSIS — R1110 Vomiting, unspecified: Secondary | ICD-10-CM

## 2021-11-07 DIAGNOSIS — R0602 Shortness of breath: Secondary | ICD-10-CM | POA: Insufficient documentation

## 2021-11-07 DIAGNOSIS — R059 Cough, unspecified: Secondary | ICD-10-CM | POA: Insufficient documentation

## 2021-11-07 DIAGNOSIS — Z79899 Other long term (current) drug therapy: Secondary | ICD-10-CM | POA: Diagnosis not present

## 2021-11-07 DIAGNOSIS — D72829 Elevated white blood cell count, unspecified: Secondary | ICD-10-CM | POA: Insufficient documentation

## 2021-11-07 DIAGNOSIS — Z7982 Long term (current) use of aspirin: Secondary | ICD-10-CM | POA: Insufficient documentation

## 2021-11-07 DIAGNOSIS — E876 Hypokalemia: Secondary | ICD-10-CM | POA: Diagnosis not present

## 2021-11-07 LAB — CBC WITH DIFFERENTIAL/PLATELET
Abs Immature Granulocytes: 0.1 10*3/uL — ABNORMAL HIGH (ref 0.00–0.07)
Basophils Absolute: 0 10*3/uL (ref 0.0–0.1)
Basophils Relative: 0 %
Eosinophils Absolute: 0 10*3/uL (ref 0.0–0.5)
Eosinophils Relative: 0 %
HCT: 47.8 % — ABNORMAL HIGH (ref 36.0–46.0)
Hemoglobin: 15.5 g/dL — ABNORMAL HIGH (ref 12.0–15.0)
Immature Granulocytes: 1 %
Lymphocytes Relative: 12 %
Lymphs Abs: 2.2 10*3/uL (ref 0.7–4.0)
MCH: 27.2 pg (ref 26.0–34.0)
MCHC: 32.4 g/dL (ref 30.0–36.0)
MCV: 84 fL (ref 80.0–100.0)
Monocytes Absolute: 0.9 10*3/uL (ref 0.1–1.0)
Monocytes Relative: 5 %
Neutro Abs: 14.7 10*3/uL — ABNORMAL HIGH (ref 1.7–7.7)
Neutrophils Relative %: 82 %
Platelets: 449 10*3/uL — ABNORMAL HIGH (ref 150–400)
RBC: 5.69 MIL/uL — ABNORMAL HIGH (ref 3.87–5.11)
RDW: 14.7 % (ref 11.5–15.5)
WBC: 18 10*3/uL — ABNORMAL HIGH (ref 4.0–10.5)
nRBC: 0 % (ref 0.0–0.2)

## 2021-11-07 LAB — URINALYSIS, ROUTINE W REFLEX MICROSCOPIC
Bilirubin Urine: NEGATIVE
Glucose, UA: NEGATIVE mg/dL
Ketones, ur: 20 mg/dL — AB
Leukocytes,Ua: NEGATIVE
Nitrite: NEGATIVE
Protein, ur: >=300 mg/dL — AB
Specific Gravity, Urine: 1.022 (ref 1.005–1.030)
pH: 5 (ref 5.0–8.0)

## 2021-11-07 LAB — COMPREHENSIVE METABOLIC PANEL
ALT: 21 U/L (ref 0–44)
AST: 16 U/L (ref 15–41)
Albumin: 4.5 g/dL (ref 3.5–5.0)
Alkaline Phosphatase: 98 U/L (ref 38–126)
Anion gap: 13 (ref 5–15)
BUN: 20 mg/dL (ref 8–23)
CO2: 24 mmol/L (ref 22–32)
Calcium: 9.5 mg/dL (ref 8.9–10.3)
Chloride: 103 mmol/L (ref 98–111)
Creatinine, Ser: 1.56 mg/dL — ABNORMAL HIGH (ref 0.44–1.00)
GFR, Estimated: 36 mL/min — ABNORMAL LOW (ref >60)
Glucose, Bld: 122 mg/dL — ABNORMAL HIGH (ref 70–99)
Potassium: 3.2 mmol/L — ABNORMAL LOW (ref 3.5–5.1)
Sodium: 140 mmol/L (ref 135–145)
Total Bilirubin: 0.9 mg/dL (ref 0.3–1.2)
Total Protein: 8.6 g/dL — ABNORMAL HIGH (ref 6.5–8.1)

## 2021-11-07 LAB — LIPASE, BLOOD: Lipase: 27 U/L (ref 11–51)

## 2021-11-07 LAB — TROPONIN I (HIGH SENSITIVITY)
Troponin I (High Sensitivity): 12 ng/L (ref <18)
Troponin I (High Sensitivity): 11 ng/L (ref <18)

## 2021-11-07 MED ORDER — POTASSIUM CHLORIDE CRYS ER 20 MEQ PO TBCR: 20.0000 meq | EXTENDED_RELEASE_TABLET | Freq: Every day | ORAL | 0 refills | Status: DC

## 2021-11-07 MED ORDER — FENTANYL CITRATE PF 50 MCG/ML IJ SOSY
50.0000 ug | PREFILLED_SYRINGE | Freq: Once | INTRAMUSCULAR | Status: AC
  Administered 2021-11-07: 50 ug via INTRAVENOUS
  Filled 2021-11-07: qty 1

## 2021-11-07 MED ORDER — ONDANSETRON HCL 4 MG/2ML IJ SOLN
4.0000 mg | Freq: Once | INTRAMUSCULAR | Status: AC
  Administered 2021-11-07: 4 mg via INTRAVENOUS
  Filled 2021-11-07: qty 2

## 2021-11-07 MED ORDER — SODIUM CHLORIDE (PF) 0.9 % IJ SOLN
INTRAMUSCULAR | Status: AC
  Filled 2021-11-07: qty 50

## 2021-11-07 MED ORDER — HYDROMORPHONE HCL 1 MG/ML IJ SOLN
1.0000 mg | Freq: Once | INTRAMUSCULAR | Status: AC
  Administered 2021-11-07: 1 mg via INTRAVENOUS
  Filled 2021-11-07: qty 1

## 2021-11-07 MED ORDER — ONDANSETRON 4 MG PO TBDP: 4.0000 mg | ORAL_TABLET | Freq: Three times a day (TID) | ORAL | 0 refills | Status: DC | PRN

## 2021-11-07 MED ORDER — POTASSIUM CHLORIDE CRYS ER 20 MEQ PO TBCR
40.0000 meq | EXTENDED_RELEASE_TABLET | Freq: Once | ORAL | Status: AC
  Administered 2021-11-07: 40 meq via ORAL
  Filled 2021-11-07: qty 2

## 2021-11-07 MED ORDER — IOHEXOL 350 MG/ML SOLN
100.0000 mL | Freq: Once | INTRAVENOUS | Status: AC | PRN
  Administered 2021-11-07: 75 mL via INTRAVENOUS

## 2021-11-07 MED ORDER — SODIUM CHLORIDE 0.9 % IV BOLUS
1000.0000 mL | Freq: Once | INTRAVENOUS | Status: AC
  Administered 2021-11-07: 1000 mL via INTRAVENOUS

## 2021-11-07 NOTE — ED Provider Notes (Signed)
[facility] DEPT Provider Note   CSN: [id number] Arrival date & time: 11/07/21  3762     History No chief complaint on file.   Jennifer Moss is a 70 y.o. female.  HPI 70 year old female presents with a chief complaint of back pain.  Starting yesterday morning she started having vomiting without blood.  Despite ice chips and ginger ale it would not stop.  She also got a little bit of diarrhea.  Transiently had some abdominal pain prior to the diarrhea but that is now gone.  However yesterday afternoon around 3 PM she started getting back pain and it has not gone away.  It is acute diffuse across her thoracic back from her bra line up.  Some cough.  She also feels short of breath.  She denies any chest pain or fevers.  No urinary symptoms.  No blood in her stools. No weakness/numbness in her lower extremities.   Past Medical History:  Diagnosis Date   Anginal pain ([place])    Arthritis    Cataract    repaired.   Chronic kidney disease    rt kidney horseshoe   Diverticulitis    Drug abuse ([place])    Hyperlipemia    Hypertension    Mallory-Weiss tear    Muscle spasms of both lower extremities    Rectal bleed    Retinal detachment    R eye, per patient   Shortness of breath     Patient Active Problem List   Diagnosis Date Noted   Vitamin D deficiency 08/20/2021   Polyneuropathy 05/21/2021   Insomnia 10/04/2020   Overweight 10/28/2019   Headache 07/12/2018   Hirsutism 08/28/2017   Gingival enlargement 04/07/2016   Rib pain 02/24/2015   BRBPR (bright red blood per rectum) 04/26/2014   Diverticulosis 04/26/2014   Hyperlipidemia 11/28/2013   Asthma 04/03/2013   Neurofibromatosis, type 1 ([place]) 01/28/2013   Constipation 01/28/2013   Injury of left shoulder 01/19/2013   Chest pain 01/12/2013   Mass of soft tissue 01/12/2013   Prolonged Q-T interval on ECG 01/11/2013   Leg pain 01/11/2013   Mallory-Weiss tear 12/23/2012   Rectal bleeding  12/23/2012   Hypertension 12/23/2012   Drug abuse (including cocaine) 12/23/2012   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   Chronic kidney disease, stage 3 ([place]) 12/23/2012    Past Surgical History:  Procedure Laterality Date   APPENDECTOMY     COLONOSCOPY N/A 04/11/2015   Procedure: COLONOSCOPY;  Surgeon: Danie Binder, MD;  Location: [facility];  Service: Endoscopy;  Laterality: N/A;  8:30 AM   COLONOSCOPY  2013   DIAGNOSTIC LAPAROSCOPY     for horseshoe kidney   ESOPHAGOGASTRODUODENOSCOPY  12/17/2012   Procedure: ESOPHAGOGASTRODUODENOSCOPY (EGD);  Surgeon: Wonda Horner, MD;  Location: [facility] ENDOSCOPY;  Service: Endoscopy;  Laterality: N/A;   Lipoma removal     in 1990s had an egg-sized tumor removed from her leg, she thinks it was a lipoma   repair of detached retina     TUBAL LIGATION     UPPER GASTROINTESTINAL ENDOSCOPY       OB History     Gravida  1   Para  1   Term      Preterm      AB      Living  1      SAB      IAB      Ectopic      Multiple  Live Births  1           Family History  Problem Relation Age of Onset   Coronary artery disease Father    Heart attack Father    Asthma Father    Hypertension Mother    Asthma Mother    Coronary artery disease Maternal Grandmother    Coronary artery disease Maternal Uncle    Diabetes Sister    Diabetes Paternal Grandmother    Kidney disease Paternal Grandmother    Colon cancer Neg Hx    Breast cancer Neg Hx    Colon polyps Neg Hx    Esophageal cancer Neg Hx    Stomach cancer Neg Hx    Rectal cancer Neg Hx     Social History   Tobacco Use   Smoking status: Never   Smokeless tobacco: Never  Vaping Use   Vaping Use: Never used  Substance Use Topics   Alcohol use: Yes    Alcohol/week: 0.0 standard drinks    Comment: occassional drink at holidays   Drug use: No    Frequency: 3.0 times per week    Types: Marijuana, Cocaine    Comment: cocaine and THC + in UDS in November 2013. Last used 1  year ago as of 04/11/15    Home Medications Prior to Admission medications   Medication Sig Start Date End Date Taking? Authorizing Provider  ondansetron (ZOFRAN ODT) 4 MG disintegrating tablet Take 1 tablet (4 mg total) by mouth every 8 (eight) hours as needed for nausea or vomiting. 11/07/21  Yes Sherwood Gambler, MD  potassium chloride SA (KLOR-CON) 20 MEQ tablet Take 1 tablet (20 mEq total) by mouth daily. 11/07/21  Yes Sherwood Gambler, MD  acetaminophen (TYLENOL) 500 MG tablet Take 1,000 mg by mouth every 6 (six) hours as needed for mild pain or headache. Take 1,000 mg by mouth every six to eight hours as needed for pain or headaches    [provider]  aspirin EC 81 MG EC tablet Take 1 tablet (81 mg total) by mouth daily. Patient taking differently: Take 81 mg by mouth in the morning. 01/19/13   Angelica Ran, MD  atorvastatin (LIPITOR) 10 MG tablet Take 1 tablet by mouth once daily 10/29/21   Leeanne Rio, MD  Cholecalciferol 1.25 MG (50000 UT) TABS Take 1 tablet by mouth once a week. For 12 weeks 05/15/21   Leeanne Rio, MD  Cyanocobalamin (B-12) 1000 MCG CAPS Take 1,000 mcg by mouth daily. 05/21/21   Leeanne Rio, MD  lactulose (CHRONULAC) 10 GM/15ML solution Take 15 mLs (10 g total) by mouth daily. 05/21/21   Leeanne Rio, MD  lisinopril (ZESTRIL) 10 MG tablet Take 1 tablet (10 mg total) by mouth daily. 09/04/21   Leeanne Rio, MD  Omega-3 Fatty Acids (FISH OIL PO) Take 1 capsule by mouth in the morning.     [provider]    Allergies    Patient has no known allergies.  Review of Systems   Review of Systems  Constitutional:  Negative for fever.  Respiratory:  Positive for shortness of breath.   Cardiovascular:  Negative for chest pain.  Gastrointestinal:  Positive for diarrhea, nausea and vomiting. Negative for abdominal pain and blood in stool.  Genitourinary:  Negative for dysuria.  Musculoskeletal:  Positive for back  pain.  Neurological:  Negative for weakness and numbness.  All other systems reviewed and are negative.  Physical Exam Updated Vital Signs BP Marland Kitchen)  170/104   Pulse (!) 56   Temp 98 F (36.7 C) (Oral)   Resp 15   Ht 5\' 2"  (1.575 m)   Wt 57.6 kg   SpO2 100%   BMI 23.23 kg/m   Physical Exam Vitals and nursing note reviewed.  Constitutional:      Appearance: She is well-developed. She is not ill-appearing or diaphoretic.  HENT:     Head: Normocephalic and atraumatic.     Right Ear: External ear normal.     Left Ear: External ear normal.     Nose: Nose normal.  Eyes:     General:        Right eye: No discharge.        Left eye: No discharge.  Cardiovascular:     Rate and Rhythm: Normal rate and regular rhythm.     Heart sounds: Normal heart sounds.  Pulmonary:     Effort: Pulmonary effort is normal.     Breath sounds: Normal breath sounds.  Abdominal:     General: There is no distension.     Palpations: Abdomen is soft.     Tenderness: There is no abdominal tenderness.  Musculoskeletal:     Thoracic back: Tenderness present.     Lumbar back: No tenderness.     Comments: Diffuse bilateral thoracic back discomfort on palpation from her bra line up towards her trapezius.  No rashes.  Skin:    General: Skin is warm and dry.  Neurological:     Mental Status: She is alert.     Comments: 5/5 strength in BLE  Psychiatric:        Mood and Affect: Mood is not anxious.    ED Results / Procedures / Treatments   Labs (all labs ordered are listed, but only abnormal results are displayed) Labs Reviewed  COMPREHENSIVE METABOLIC PANEL - Abnormal; Notable for the following components:      Result Value   Potassium 3.2 (*)    Glucose, Bld 122 (*)    Creatinine, Ser 1.56 (*)    Total Protein 8.6 (*)    GFR, Estimated 36 (*)    All other components within normal limits  CBC WITH DIFFERENTIAL/PLATELET - Abnormal; Notable for the following components:   WBC 18.0 (*)    RBC 5.69  (*)    Hemoglobin 15.5 (*)    HCT 47.8 (*)    Platelets 449 (*)    Neutro Abs 14.7 (*)    Abs Immature Granulocytes 0.10 (*)    All other components within normal limits  URINALYSIS, ROUTINE W REFLEX MICROSCOPIC - Abnormal; Notable for the following components:   APPearance HAZY (*)    Hgb urine dipstick SMALL (*)    Ketones, ur 20 (*)    Protein, ur >=300 (*)    Bacteria, UA RARE (*)    All other components within normal limits  LIPASE, BLOOD  TROPONIN I (HIGH SENSITIVITY)  TROPONIN I (HIGH SENSITIVITY)    EKG EKG Interpretation  Date/Time:  Thursday November 07 2021 08:32:10 EST Ventricular Rate:  69 PR Interval:  127 QRS Duration: 86 QT Interval:  412 QTC Calculation: 442 R Axis:   4 Text Interpretation: Sinus rhythm no acute ischemia Confirmed by Sherwood Gambler [phone]) on 11/07/2021 9:09:20 AM  Radiology DG Chest 2 View  Result Date: 11/07/2021 CLINICAL DATA:  Vomiting EXAM: CHEST - 2 VIEW COMPARISON:  Chest x-ray 02/22/2015 FINDINGS: Heart size and mediastinal contours are within normal limits. No suspicious pulmonary opacities  identified. No pleural effusion or pneumothorax visualized. No acute osseous abnormality appreciated. IMPRESSION: No acute intrathoracic process identified. Electronically Signed   By: Ofilia Neas M.D.   On: 11/07/2021 09:39   CT Angio Chest/Abd/Pel for Dissection W and/or Wo Contrast  Result Date: 11/07/2021 CLINICAL DATA:  Possible dissection.  Back and chest pain EXAM: CT ANGIOGRAPHY CHEST, ABDOMEN AND PELVIS TECHNIQUE: Non-contrast CT of the chest was initially obtained. Multidetector CT imaging through the chest, abdomen and pelvis was performed using the standard protocol during bolus administration of intravenous contrast. Multiplanar reconstructed images and MIPs were obtained and reviewed to evaluate the vascular anatomy. CONTRAST:  46mL OMNIPAQUE IOHEXOL 350 MG/ML SOLN COMPARISON:  CT abdomen and pelvis 04/19/2020 FINDINGS: CTA  CHEST FINDINGS Cardiovascular: Heart is mildly enlarged. No pericardial effusion identified. Main pulmonary artery is normal caliber. Thoracic aorta is tortuous and normal caliber without significant atherosclerotic plaques. No intramural hematoma or dissection identified in the thoracic aorta. Mediastinum/Nodes: Heterogeneous thyroid gland with small nodules on the right. No bulky axillary, mediastinal or hilar lymphadenopathy identified. Lungs/Pleura: Breathing motion and mild subsegmental atelectatic changes in the lungs. No focal consolidation identified. No pleural effusion or pneumothorax. Musculoskeletal: No suspicious bony lesions identified. Review of the MIP images confirms the above findings. CTA ABDOMEN AND PELVIS FINDINGS VASCULAR Aorta: Normal caliber with mild atherosclerotic plaques and tortuosity distally. No dissection visualized. Celiac: Patent without evidence of aneurysm, dissection, vasculitis or significant stenosis. SMA: Patent without evidence of aneurysm, dissection, vasculitis or significant stenosis. 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 IMA: Patent without evidence of aneurysm, dissection, vasculitis or significant stenosis. Inflow: Patent without evidence of aneurysm, dissection, vasculitis or significant stenosis. Veins: No obvious venous abnormality within the limitations of this arterial phase study. Review of the MIP images confirms the above findings. NON-VASCULAR Hepatobiliary: Liver is upper normal size with normal contour. 1 cm hypodense likely cyst in the right hepatic lobe segment 6. Gallbladder appears grossly normal. No biliary ductal dilatation identified. Pancreas: Within normal limits. Spleen: Normal in size without focal abnormality. Adrenals/Urinary Tract: No adrenal mass identified. 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 No hydronephrosis or perfusion defects. Urinary bladder appears normal.  Stomach/Bowel: No bowel obstruction, free air or pneumatosis. Colonic diverticulosis with chronic appearing wall thickening in the sigmoid colon. No evidence of acute appendicitis. Lymphatic: No bulky lymphadenopathy identified. Reproductive: Uterus and bilateral adnexa are unremarkable. Other: No abdominal wall hernia or abnormality. No abdominopelvic ascites. Musculoskeletal: No acute or significant osseous findings. Degenerative changes at L5-S1. Review of the MIP images confirms the above findings. IMPRESSION: 1. No acute aortic syndrome identified. 2. Extensive colonic diverticulosis. Chronic appearing wall thickening in the sigmoid colon may represent chronic diverticulitis changes. 3. Other chronic findings as described. Electronically Signed   By: Ofilia Neas M.D.   On: 11/07/2021 13:42    Procedures Procedures   Medications Ordered in ED Medications  sodium chloride 0.9 % bolus 1,000 mL (0 mLs Intravenous Stopped 11/07/21 1058)  fentaNYL (SUBLIMAZE) injection 50 mcg (50 mcg Intravenous Given 11/07/21 0919)  ondansetron (ZOFRAN) injection 4 mg (4 mg Intravenous Given 11/07/21 0918)  HYDROmorphone (DILAUDID) injection 1 mg (1 mg Intravenous Given 11/07/21 1030)  iohexol (OMNIPAQUE) 350 MG/ML injection 100 mL (75 mLs Intravenous Contrast Given 11/07/21 1213)  sodium chloride (PF) 0.9 % injection (  Given by Other 11/07/21 1257)  potassium chloride SA (KLOR-CON) CR tablet 40 mEq (40 mEq Oral Given  11/07/21 1424)    ED Course  I have reviewed the triage vital signs and the nursing notes.  Pertinent labs & imaging results that were available during my care of the patient were reviewed by me and considered in my medical decision making (see chart for details).    MDM Rules/Calculators/A&P                           Patient is persistently having back pain and given her leukocytosis and persistent pain after vomiting a CT was obtained.  There is no obvious intrathoracic pathology or  aortic dissection.  Her mild hypokalemia will be repleted, likely this is from vomiting.  Probably this is the cause of her leukocytosis as well.  At this point she is feeling better and has no further vomiting and pain is improved.  Unclear cause of her vomiting but there is no obvious intra-abdominal source.  Will discharge home with return precautions. Final Clinical Impression(s) / ED Diagnoses Final diagnoses:  Acute bilateral thoracic back pain  Vomiting, unspecified vomiting type, unspecified whether nausea present  Hypokalemia    Rx / DC Orders ED Discharge Orders          Ordered    ondansetron (ZOFRAN ODT) 4 MG disintegrating tablet  Every 8 hours PRN        11/07/21 1424    potassium chloride SA (KLOR-CON) 20 MEQ tablet  Daily        11/07/21 1424             Sherwood Gambler, MD 11/07/21 [phone]

## 2021-11-07 NOTE — ED Triage Notes (Signed)
Pt BIB [facility] from home c/o N/V since yesterday. Pt has a hx of diverticulitis. Pt also states a severe cough with chest and back pain.   Vital Signs were:  170/100 99% RA 22 RR 176 CBG

## 2021-11-07 NOTE — Discharge Instructions (Addendum)
If you develop worsening, recurrent, or continued back pain, numbness or weakness in the legs, incontinence of your bowels or bladders, numbness of your buttocks, fever, chest pain, abdominal pain, or any other new/concerning symptoms then return to the ER for evaluation.

## 2021-11-19 ENCOUNTER — Other Ambulatory Visit: Payer: Self-pay

## 2021-11-19 ENCOUNTER — Ambulatory Visit (INDEPENDENT_AMBULATORY_CARE_PROVIDER_SITE_OTHER): Payer: Medicare Other | Admitting: Family Medicine

## 2021-11-19 ENCOUNTER — Encounter: Payer: Self-pay | Admitting: Family Medicine

## 2021-11-19 VITALS — BP 124/78 | HR 74 | Ht 62.0 in | Wt 125.0 lb

## 2021-11-19 DIAGNOSIS — E876 Hypokalemia: Secondary | ICD-10-CM | POA: Diagnosis not present

## 2021-11-19 MED ORDER — CYCLOBENZAPRINE HCL 5 MG PO TABS: 2.5000 mg | ORAL_TABLET | Freq: Every evening | ORAL | 0 refills | Status: DC | PRN

## 2021-11-19 NOTE — Progress Notes (Signed)
  Date of Visit: 11/19/2021   SUBJECTIVE:   HPI:  Jennifer Moss presents today for ED follow up.  Seen in ED on 11/10 - had been vomiting throughout the day and developed severe upper thoracic back pain in trapezius muscle area. Labs noteworthy at that time for leukocytosis and hypokalemia. Had CT to rule out dissection which was negative. Since then she has been feeling much better. Continues to have some muscle tenderness in upper back but overall improving. GI symptoms have resolved.  Was discharged from ED with rx for [facility] x3 days but has not taken.  OBJECTIVE:   BP 124/78   Pulse 74   Ht 5\' 2"  (1.575 m)   Wt 125 lb (56.7 kg)   BMI 22.86 kg/m  Gen: no acute distress, pleasant cooperative HEENT: normocephalic, atraumatic  Heart: regular rate and rhythm, no murmur Lungs: clear to auscultation bilaterally, normal work of breathing  Neuro: alert speech normal grossly nonfocal Musculoskeletal: mild tenderness over trapezius muscle bilaterally. Full ROM of neck  ASSESSMENT/PLAN:   Health maintenance:  -advised to get second dose of shingrix  Trapezius muscle strain Rx baclofen, low dose, counseled on risk of sedation Other symptoms which prompted ED visit have resolved  Hypokalemia Check BMET today to ensure resolution of hypoK from ED  FOLLOW UP: Follow up in springtime with me for chronic medical problems  Jennifer Moss, [place]

## 2021-11-19 NOTE — Patient Instructions (Signed)
It was great to see you again today!  Checking kidneys and potassium today Sent in muscle relaxer - use caution, may make you sleepy  Go get second dose of shingles vaccine & let us know when you get it  Follow up with me in the spring, sooner with any doctor here if needed  Be well, Dr. Ardelia Mems

## 2021-11-21 LAB — BASIC METABOLIC PANEL
BUN/Creatinine Ratio: 7 — ABNORMAL LOW (ref 12–28)
BUN: 11 mg/dL (ref 8–27)
CO2: 19 mmol/L — ABNORMAL LOW (ref 20–29)
Calcium: 9.8 mg/dL (ref 8.7–10.3)
Chloride: 104 mmol/L (ref 96–106)
Creatinine, Ser: 1.52 mg/dL — ABNORMAL HIGH (ref 0.57–1.00)
Glucose: 100 mg/dL — ABNORMAL HIGH (ref 70–99)
Potassium: 6.1 mmol/L — ABNORMAL HIGH (ref 3.5–5.2)
Sodium: 147 mmol/L — ABNORMAL HIGH (ref 134–144)
eGFR: 37 mL/min/{1.73_m2} — ABNORMAL LOW (ref >59)

## 2021-11-25 ENCOUNTER — Telehealth: Payer: Self-pay | Admitting: Family Medicine

## 2021-11-25 DIAGNOSIS — E875 Hyperkalemia: Secondary | ICD-10-CM

## 2021-11-25 NOTE — Telephone Encounter (Signed)
Contacted patient to discuss lab results. K was elevated at 6.1 - suspect hemolysis contributing (patient denied taking K supplement that was prescribed to her at ED).  Will recheck. Lab visit scheduled for tomorrow AM at 8:45.  Patient appreciative & agreeable.  Leeanne Rio, MD

## 2021-11-26 ENCOUNTER — Other Ambulatory Visit: Payer: Self-pay

## 2021-11-26 ENCOUNTER — Other Ambulatory Visit: Payer: Medicare Other

## 2021-11-26 DIAGNOSIS — E875 Hyperkalemia: Secondary | ICD-10-CM

## 2021-11-27 LAB — BASIC METABOLIC PANEL
BUN/Creatinine Ratio: 10 — ABNORMAL LOW (ref 12–28)
BUN: 19 mg/dL (ref 8–27)
CO2: 24 mmol/L (ref 20–29)
Calcium: 9.4 mg/dL (ref 8.7–10.3)
Chloride: 109 mmol/L — ABNORMAL HIGH (ref 96–106)
Creatinine, Ser: 1.89 mg/dL — ABNORMAL HIGH (ref 0.57–1.00)
Glucose: 109 mg/dL — ABNORMAL HIGH (ref 70–99)
Potassium: 5.5 mmol/L — ABNORMAL HIGH (ref 3.5–5.2)
Sodium: 149 mmol/L — ABNORMAL HIGH (ref 134–144)
eGFR: 29 mL/min/{1.73_m2} — ABNORMAL LOW (ref >59)

## 2021-11-29 ENCOUNTER — Telehealth: Payer: Self-pay | Admitting: Family Medicine

## 2021-11-29 DIAGNOSIS — I10 Essential (primary) hypertension: Secondary | ICD-10-CM

## 2021-11-29 NOTE — Telephone Encounter (Signed)
Called patient about elevated potassium.  She previously had hypokalemia but has had elevated potassium X2 since that time.  Denies any palpitations or symptoms. Stop lisinopril over weekend. She has already stopped KCL.  She also has mild hyponatremia and slight elevation in creatinine---Cr  is similar from about a year ago.  Suspect she is actually near her baseline.  Encourage PO  fluids.  Discussed this at length.  Teach back method used to ensure she understood stopping medication pushing p.o. fluids.  Recommended repeat at [address] [place] on Monday).  Nursing- can you please fax over order?  All questions answered.  Dorris Singh, MD  Family Medicine Teaching Service

## 2021-11-29 NOTE — Telephone Encounter (Signed)
Lab order faxed to [facility] on [address].   Patient called and informed that she does not need an appointment for lab work.   Talbot Grumbling, RN

## 2021-12-02 ENCOUNTER — Other Ambulatory Visit: Payer: Self-pay | Admitting: Family Medicine

## 2021-12-03 LAB — BASIC METABOLIC PANEL
BUN/Creatinine Ratio: 12 (ref 12–28)
BUN: 20 mg/dL (ref 8–27)
CO2: 23 mmol/L (ref 20–29)
Calcium: 9.6 mg/dL (ref 8.7–10.3)
Chloride: 104 mmol/L (ref 96–106)
Creatinine, Ser: 1.68 mg/dL — ABNORMAL HIGH (ref 0.57–1.00)
Glucose: 95 mg/dL (ref 70–99)
Potassium: 5.6 mmol/L — ABNORMAL HIGH (ref 3.5–5.2)
Sodium: 143 mmol/L (ref 134–144)
eGFR: 33 mL/min/{1.73_m2} — ABNORMAL LOW (ref >59)

## 2021-12-05 ENCOUNTER — Other Ambulatory Visit: Payer: Self-pay | Admitting: Family Medicine

## 2021-12-05 ENCOUNTER — Telehealth: Payer: Self-pay | Admitting: Family Medicine

## 2021-12-05 DIAGNOSIS — E875 Hyperkalemia: Secondary | ICD-10-CM

## 2021-12-05 NOTE — Telephone Encounter (Signed)
Called patient about elevated K. She reports she feels well. She reports she went back on her lisinopril before the lab draw. - Stop lisinopril - Avoid salt substitutes - Repeat labs Monday - BP check with Dr.Koval on Thursday Reviewed return precautions.   Dorris Singh, MD  Family Medicine Teaching Service

## 2021-12-09 ENCOUNTER — Other Ambulatory Visit: Payer: Self-pay

## 2021-12-09 ENCOUNTER — Other Ambulatory Visit: Payer: Medicare Other

## 2021-12-09 DIAGNOSIS — E875 Hyperkalemia: Secondary | ICD-10-CM

## 2021-12-09 DIAGNOSIS — I10 Essential (primary) hypertension: Secondary | ICD-10-CM

## 2021-12-10 ENCOUNTER — Telehealth: Payer: Self-pay | Admitting: Family Medicine

## 2021-12-10 LAB — RENAL FUNCTION PANEL
Albumin: 4.3 g/dL (ref 3.8–4.8)
BUN/Creatinine Ratio: 11 — ABNORMAL LOW (ref 12–28)
BUN: 16 mg/dL (ref 8–27)
CO2: 20 mmol/L (ref 20–29)
Calcium: 9.3 mg/dL (ref 8.7–10.3)
Chloride: 102 mmol/L (ref 96–106)
Creatinine, Ser: 1.49 mg/dL — ABNORMAL HIGH (ref 0.57–1.00)
Glucose: 82 mg/dL (ref 70–99)
Phosphorus: 3.5 mg/dL (ref 3.0–4.3)
Potassium: 4.5 mmol/L (ref 3.5–5.2)
Sodium: 139 mmol/L (ref 134–144)
eGFR: 38 mL/min/{1.73_m2} — ABNORMAL LOW (ref >59)

## 2021-12-10 NOTE — Telephone Encounter (Signed)
Called patient with results.  She will follow-up with Dr. Valentina Lucks and likely restart amlodipine which she has been on in the past.  Her potassium is at 4.5 and I doubt she will tolerate an addition of an ARB or ACE again.  All questions answered.  Dorris Singh, MD  Family Medicine Teaching Service

## 2021-12-12 ENCOUNTER — Encounter: Payer: Self-pay | Admitting: Pharmacist

## 2021-12-12 ENCOUNTER — Other Ambulatory Visit: Payer: Self-pay

## 2021-12-12 ENCOUNTER — Ambulatory Visit (INDEPENDENT_AMBULATORY_CARE_PROVIDER_SITE_OTHER): Payer: Medicare Other | Admitting: Pharmacist

## 2021-12-12 DIAGNOSIS — I10 Essential (primary) hypertension: Secondary | ICD-10-CM

## 2021-12-12 MED ORDER — AMLODIPINE BESYLATE 5 MG PO TABS: 5.0000 mg | ORAL_TABLET | Freq: Every day | ORAL | Status: DC

## 2021-12-12 NOTE — Progress Notes (Signed)
Reviewed: I agree with Dr. Koval's documentation and management. 

## 2021-12-12 NOTE — Patient Instructions (Addendum)
Nice to see you today.   Blood pressure remains elevated today.   Restart AMLODIPINE 5mg  (you can take 1/2 tablet) daily.   Follow-up 1/12 at 8:30 AM

## 2021-12-12 NOTE — Progress Notes (Signed)
S:    Patient arrives in good spirits, ambulating without assistance. Presents to the clinic for hypertension evaluation, counseling, and management. Patient was referred and last seen by Primary Care Provider, Dr. Ardelia Mems on 11/19/2021. Not checking blood pressure at home recently since stopping lisinopril. Denies dizziness, headaches, or feelings of high BP since stopping lisinopril.   Medication adherence overall good.   Current BP Medications include:  NONE - stopped Lisinopril   Previous BP medications include: amlodipine (stopped previously but had no adverse effects or problems while on it)  Dietary habits include: Attempting to minimize "added salt" - using a mixed flavoring with some salt but reports using a limited amount.  Reports eating salads, baked potato, sweet potato, green beans, collards Reports she does not like eating fruit very often - grew up eating a lot of fruit, and does not like melons, citrus or apples any more. Reports she like eating pears. Typically eating 2 meals per day. Sometimes just 1. Does not snack.   Exercise habits include: fitness gym - 3 days per week, 1.5 hours each time Family / Social history: lives alone  O:  Physical Exam Musculoskeletal:     Right lower leg: No edema.     Left lower leg: No edema.  Psychiatric:        Mood and Affect: Mood normal.        Behavior: Behavior normal.    Review of Systems  Neurological:  Negative for dizziness and headaches.  All other systems reviewed and are negative.  Home BP readings: Has a BP cuff at home but has not been checking since stopping lisinoprl  Last 3 Office BP readings: BP Readings from Last 3 Encounters:  12/12/21 (!) 138/98  11/19/21 124/78  11/07/21 (!) 170/104    BMET    Component Value Date/Time   NA 139 12/09/2021 0905   K 4.5 12/09/2021 0905   CL 102 12/09/2021 0905   CO2 20 12/09/2021 0905   GLUCOSE 82 12/09/2021 0905   GLUCOSE 122 (H) 11/07/2021 0806   BUN 16  12/09/2021 0905   CREATININE 1.49 (H) 12/09/2021 0905   CREATININE 1.76 (H) 09/22/2016 0955   CALCIUM 9.3 12/09/2021 0905   GFRNONAA 36 (L) 11/07/2021 0806   GFRNONAA 31 (L) 09/22/2016 0955   GFRAA 36 (L) 05/15/2020 1004   GFRAA 35 (L) 09/22/2016 0955    Renal function: Estimated Creatinine Clearance: 28.6 mL/min (A) (by C-G formula based on SCr of 1.49 mg/dL (H)).  Clinical ASCVD: No  The 10-year ASCVD risk score (Arnett DK, et al., 2019) is: 10.7%   Values used to calculate the score:     Age: 70 years     Sex: Female     Is Non-Hispanic African American: Yes     Diabetic: No     Tobacco smoker: No     Systolic Blood Pressure: 992 mmHg     Is BP treated: Yes     HDL Cholesterol: 59 mg/dL     Total Cholesterol: 168 mg/dL  PHQ-9 Score: 2  A/P: Hypertension currently uncontrolled while on no medications currently after stopping lisinopril recently due to hyperkalemia. BP was at goal while on lisinopril 10 mg previously. BP Goal = < 130/80 mmHg. She previously tolerated amlodipine in the past and is agreeable to trying this again. She reports having plenty of amlodipine tablets remaining at home from a previous prescription.  -Re-start amlodipine 5 mg daily. -Continue to not take lisinopril.   -Counseled  on lifestyle modifications for blood pressure control including reduced dietary sodium, increased exercise, adequate sleep.  Results reviewed and written information provided. Total time in face-to-face counseling 20 minutes.   F/U Pharmacist Clinic Visit in 4 weeks for BP check.  Patient seen with Rebbeca Paul, PharmD - PGY2 Pharmacy Resident.

## 2021-12-12 NOTE — Assessment & Plan Note (Signed)
Hypertension currently uncontrolled while on no medications currently after stopping lisinopril recently due to hyperkalemia. BP was at goal while on lisinopril 10 mg previously. BP Goal = < 130/80 mmHg. She previously tolerated amlodipine in the past and is agreeable to trying this again. She reports having plenty of amlodipine tablets remaining at home from a previous prescription.  -Re-start amlodipine 5 mg daily

## 2022-01-02 ENCOUNTER — Other Ambulatory Visit: Payer: Self-pay | Admitting: Family Medicine

## 2022-01-02 DIAGNOSIS — Z1231 Encounter for screening mammogram for malignant neoplasm of breast: Secondary | ICD-10-CM

## 2022-01-06 DIAGNOSIS — I129 Hypertensive chronic kidney disease with stage 1 through stage 4 chronic kidney disease, or unspecified chronic kidney disease: Secondary | ICD-10-CM | POA: Diagnosis not present

## 2022-01-06 DIAGNOSIS — N189 Chronic kidney disease, unspecified: Secondary | ICD-10-CM | POA: Diagnosis not present

## 2022-01-06 DIAGNOSIS — D631 Anemia in chronic kidney disease: Secondary | ICD-10-CM | POA: Diagnosis not present

## 2022-01-06 DIAGNOSIS — Q631 Lobulated, fused and horseshoe kidney: Secondary | ICD-10-CM | POA: Diagnosis not present

## 2022-01-06 DIAGNOSIS — K5790 Diverticulosis of intestine, part unspecified, without perforation or abscess without bleeding: Secondary | ICD-10-CM | POA: Diagnosis not present

## 2022-01-06 DIAGNOSIS — N183 Chronic kidney disease, stage 3 unspecified: Secondary | ICD-10-CM | POA: Diagnosis not present

## 2022-01-06 DIAGNOSIS — N2581 Secondary hyperparathyroidism of renal origin: Secondary | ICD-10-CM | POA: Diagnosis not present

## 2022-01-09 ENCOUNTER — Ambulatory Visit (INDEPENDENT_AMBULATORY_CARE_PROVIDER_SITE_OTHER): Payer: Medicare Other | Admitting: Pharmacist

## 2022-01-09 ENCOUNTER — Other Ambulatory Visit: Payer: Self-pay

## 2022-01-09 DIAGNOSIS — I10 Essential (primary) hypertension: Secondary | ICD-10-CM | POA: Diagnosis not present

## 2022-01-09 NOTE — Assessment & Plan Note (Signed)
Hypertension currently uncontrolled on current medications but improved since starting amlodipine at last visit. BP Goal = < 130/80 mmHg. Medication adherence appears appropriate. Given that she is now being seen by nephrology, we will defer further management of her BP to them. Would recommend initiating a thiazide diuretic to help lower BP to goal and, as a beneficial adverse effect, help lower her potassium.  -Continue current medications: amlodipine 5 mg daily

## 2022-01-09 NOTE — Patient Instructions (Addendum)
It was nice to see you today!  Your goal blood pressure is less than 130/80 mmHg. In clinic, your blood pressure was 130/90 mmHg.  Medication Changes: Continue amlodipine 5 mg daily  Follow up with nephrology for BP management. We recommend they consider addition of a thiazide diuretic to help lower BP to goal and, as a beneficial side effect, lower potassium.   Monitor blood pressure at home daily and keep a log (on your phone or piece of paper) to bring with you to your next visit. Write down date, time, blood pressure and pulse. If you buy a new BP cuff, please bring it to your next visit so we can make sure it is accurate.   Keep up the good work with diet and exercise. Aim for a diet full of vegetables, fruit and lean meats (chicken, [place], fish). Try to limit salt intake by eating fresh or frozen vegetables (instead of canned), rinse canned vegetables prior to cooking and do not add any additional salt to meals.

## 2022-01-09 NOTE — Progress Notes (Signed)
S:    Patient presents to the clinic for hypertension evaluation, counseling, and management. Patient was last seen by Primary Care Provider, Dr. Ardelia Mems, on 11/19/21. Dr. Owens Shark recently stopped her lisinopril after her potassium was elevated. At last pharmacist visit on 12/12/21, amlodipine was added for elevated BP. Her potassium had initially come down after stopping lisinopril but at her nephrology visit this week, she reports her potassium was 6.1. They gave her Lokelma 10 g x 3 days and she is scheduled for follow up labs on Wednesday with them. She wonders if her vitamins (she takes a vitamin D and B12 supplement) could be contributing to her elevated potassium. Confirmed that she has stopped taking lisinopril. Medication adherence reported to amlodipine. Denies dizziness, headache, blurred vision, swelling.   Current BP Medications include:  amlodipine 5 mg daily  Antihypertensives tried in the past include: lisinopril (stopped due to hyperkalemia)  Dietary habits include: uses a Morton's seasoning that includes salt, pepper, lemon pepper, and garlic powder. Confirmed it does not include potassium.  Family / Social history: never smoker  O:  Physical Exam Vitals reviewed.  Constitutional:      Appearance: Normal appearance.  Cardiovascular:     Rate and Rhythm: Normal rate.  Pulmonary:     Effort: Pulmonary effort is normal.  Musculoskeletal:        General: No swelling.  Neurological:     Mental Status: She is alert.  Psychiatric:        Mood and Affect: Mood normal.        Behavior: Behavior normal.        Thought Content: Thought content normal.   Review of Systems  All other systems reviewed and are negative.  Home BP readings: uses a bicep cuff to check BP at home ~3 times per week. Reports readings of 145-150/80-90s.   Last 3 Office BP readings: BP Readings from Last 3 Encounters:  01/09/22 130/90  12/12/21 (!) 138/98  11/19/21 124/78   BMET    Component  Value Date/Time   NA 139 12/09/2021 0905   K 4.5 12/09/2021 0905   CL 102 12/09/2021 0905   CO2 20 12/09/2021 0905   GLUCOSE 82 12/09/2021 0905   GLUCOSE 122 (H) 11/07/2021 0806   BUN 16 12/09/2021 0905   CREATININE 1.49 (H) 12/09/2021 0905   CREATININE 1.76 (H) 09/22/2016 0955   CALCIUM 9.3 12/09/2021 0905   GFRNONAA 36 (L) 11/07/2021 0806   GFRNONAA 31 (L) 09/22/2016 0955   GFRAA 36 (L) 05/15/2020 1004   GFRAA 35 (L) 09/22/2016 0955   Clinical ASCVD: No  The 10-year ASCVD risk score (Arnett DK, et al., 2019) is: 9.5%   Values used to calculate the score:     Age: 71 years     Sex: Female     Is Non-Hispanic African American: Yes     Diabetic: No     Tobacco smoker: No     Systolic Blood Pressure: 673 mmHg     Is BP treated: Yes     HDL Cholesterol: 59 mg/dL     Total Cholesterol: 168 mg/dL  A/P:  Hypertension currently uncontrolled on current medications but improved since starting amlodipine at last visit. BP Goal = < 130/80 mmHg. Medication adherence appears appropriate. Given that she is now being seen by nephrology, we will defer further management of her BP to them. Would recommend initiating a thiazide diuretic to help lower BP to goal and, as a beneficial adverse effect, help  lower her potassium.  -Continue current medications: amlodipine 5 mg daily  -F/u labs ordered - BMET at next nephrology appt on 01/15/21 -Counseled on lifestyle modifications for blood pressure control including reduced dietary sodium, increased exercise, adequate sleep.  Results reviewed and written information provided. Total time in face-to-face counseling 25 minutes.   F/U Clinic visit with PCP as needed. She will follow up with nephrology for BP management. Patient seen with Gala Murdoch, PharmD Candidate, and Rebbeca Paul, PharmD - PGY2 Pharmacy Resident.

## 2022-01-09 NOTE — Progress Notes (Signed)
Reviewed: I agree with Dr. Koval's documentation and management. 

## 2022-01-15 DIAGNOSIS — Q631 Lobulated, fused and horseshoe kidney: Secondary | ICD-10-CM | POA: Diagnosis not present

## 2022-01-22 ENCOUNTER — Ambulatory Visit: Payer: Commercial Managed Care - HMO

## 2022-01-30 ENCOUNTER — Ambulatory Visit
Admission: RE | Admit: 2022-01-30 | Discharge: 2022-01-30 | Disposition: A | Discharge status: Home / Self Care | Payer: Medicare Other | Source: Ambulatory Visit | Attending: Family Medicine | Admitting: Family Medicine

## 2022-01-30 DIAGNOSIS — Z1231 Encounter for screening mammogram for malignant neoplasm of breast: Secondary | ICD-10-CM

## 2022-03-05 ENCOUNTER — Other Ambulatory Visit: Payer: Self-pay | Admitting: Family Medicine

## 2022-03-18 ENCOUNTER — Ambulatory Visit (INDEPENDENT_AMBULATORY_CARE_PROVIDER_SITE_OTHER): Payer: Medicare Other

## 2022-03-18 VITALS — Ht 60.0 in | Wt 135.0 lb

## 2022-03-18 DIAGNOSIS — Z0000 Encounter for general adult medical examination without abnormal findings: Secondary | ICD-10-CM

## 2022-03-18 NOTE — Progress Notes (Signed)
? ?Subjective:  ? Jennifer Moss is a 71 y.o. female who presents for Medicare Annual (Subsequent) preventive examination. ? ?Patient consented to have virtual visit and was identified by name and date of birth. ?Method of visit: Telephone ? ?Encounter participants: ?Patient: Jennifer Moss - located at Home ?Nurse/Provider: Dorna Bloom - located at [facility] ?Others (if applicable): NA ? ?Review of Systems: Defer to PCP ? ?Cardiac Risk Factors include: advanced age (>32mn, >>64women);dyslipidemia;hypertension ? ?Objective:  ? ?Vitals: Ht 5' (1.524 m)   Wt 135 lb (61.2 kg)   BMI 26.37 kg/m?   Body mass index is 26.37 kg/m?. ? ?Advanced Directives 03/18/2022 07/09/2021 05/21/2021 05/09/2020 04/19/2020 08/08/2019 07/09/2018  ?Does Patient Have a Medical Advance Directive? No No No No No No No  ?Type of Advance Directive - - - - - - -  ?Copy of [place] Chart? - - - - - - -  ?Would patient like information on creating a medical advance directive? Yes (MAU/Ambulatory/Procedural Areas - Information given) No - Patient declined No - Patient declined No - Patient declined No - Patient declined Yes (MAU/Ambulatory/Procedural Areas - Information given) No - Patient declined  ?Pre-existing out of facility DNR order (yellow form or pink MOST form) - - - - - - -  ? ?Tobacco ?Social History  ? ?Tobacco Use  ?Smoking Status Never  ? Passive exposure: Never  ?Smokeless Tobacco Never  ?   ?Clinical Intake: ? ?Pre-visit preparation completed: Yes ? ?Pain : No/denies pain ?Pain Score: 0-No pain ? ?Diabetic: No ? ?Nutritional Status: BMI 25 -29 Overweight ?Diabetes: No ? ?What is the last grade level you completed in school?: High School ? ?Interpreter Needed?: No ? ?Past Medical History:  ?Diagnosis Date  ? Anginal pain ([place]   ? Arthritis   ? Cataract   ? repaired.  ? Chronic kidney disease   ? rt kidney horseshoe  ? Diverticulitis   ? Drug [place]   ? Hyperlipemia   ? Hypertension   ? Mallory-Weiss tear   ?  Muscle spasms of both lower extremities   ? Rectal bleed   ? Retinal detachment   ? R eye, per patient  ? Shortness of breath   ? ?Past Surgical History:  ?Procedure Laterality Date  ? APPENDECTOMY    ? COLONOSCOPY N/A 04/11/2015  ? Procedure: COLONOSCOPY;  Surgeon: SDanie Binder MD;  [facility];  Service: Endoscopy;  Laterality: N/A;  8:30 AM  ? COLONOSCOPY  2013  ? DIAGNOSTIC LAPAROSCOPY    ? for horseshoe kidney  ? ESOPHAGOGASTRODUODENOSCOPY  12/17/2012  ? Procedure: ESOPHAGOGASTRODUODENOSCOPY (EGD);  Surgeon: SWonda Horner MD;  [facility];  Service: Endoscopy;  Laterality: N/A;  ? Lipoma removal    ? in 1990s had an egg-sized tumor removed from her leg, she thinks it was a lipoma  ? repair of detached retina    ? TUBAL LIGATION    ? UPPER GASTROINTESTINAL ENDOSCOPY    ? ?Family History  ?Problem Relation Age of Onset  ? Hypertension Mother   ? Asthma Mother   ? Coronary artery disease Father   ? Heart attack Father   ? Asthma Father   ? Diabetes Sister   ? Drug abuse Sister   ? Stroke Sister   ? Drug abuse Sister   ? Heart disease Sister   ? Coronary artery disease Maternal Uncle   ? Stroke Maternal Grandmother   ? Coronary artery  disease Maternal Grandmother   ? Diabetes Paternal Grandmother   ? Kidney disease Paternal Grandmother   ? Colon cancer Neg Hx   ? Breast cancer Neg Hx   ? Colon polyps Neg Hx   ? Esophageal cancer Neg Hx   ? Stomach cancer Neg Hx   ? Rectal cancer Neg Hx   ? ?Social History  ? ?Socioeconomic History  ? Marital status: Legally Separated  ?  Spouse name: Not on file  ? Number of children: 1  ? Years of education: 51  ? Highest education level: High school graduate  ?Occupational History  ? Occupation: Retired  ?  Comment: Cook at [employer]   ?Tobacco Use  ? Smoking status: Never  ?  Passive exposure: Never  ? Smokeless tobacco: Never  ?Vaping Use  ? Vaping Use: Never used  ?Substance and Sexual Activity  ? Alcohol use: Yes  ?  Alcohol/week: 0.0 standard drinks  ?   Comment: occassional drink at holidays  ? Drug use: Yes  ?  Frequency: 3.0 times per week  ?  Types: Marijuana, Cocaine, "Crack" cocaine  ?  Comment: cocaine and THC + in UDS in November 2013. Last used 1 year ago as of 04/11/15  ? Sexual activity: Not Currently  ?Other Topics Concern  ? Not on file  ?Social History Narrative  ? Patient lives alone in [place].   ? Patient is legally separated.   ? Has one daughter and they are very close. "[employer]" for grandchildren.   ? Patient is retired and enjoying it.  ? Walks one mile everyday for exercise.   ? Patient is active with her church and has a small friend group she spends time with.  ?   ? -Has history of smoking crack cocaine and is trying hard to stop. It's been hard because she takes the bus. When she feels the urge to smoke she prays, reads the Bible, or calls her sister for support. She does well if she stays active, but the times when she most wants to smoke crack is if she is bored at home. *Patient denies any recent use.*  ?   ? -Also uses marijuana every few months. *Denies any recent use.*  ?   ? ?Social Determinants of Health  ? ?Financial Resource Strain: Low Risk   ? Difficulty of Paying Living Expenses: Not hard at all  ?Food Insecurity: No Food Insecurity  ? Worried About Charity fundraiser in the Last Year: Never true  ? Ran Out of Food in the Last Year: Never true  ?Transportation Needs: No Transportation Needs  ? Lack of Transportation (Medical): No  ? Lack of Transportation (Non-Medical): No  ?Physical Activity: Insufficiently Active  ? Days of Exercise per Week: 7 days  ? Minutes of Exercise per Session: 20 min  ?Stress: No Stress Concern Present  ? Feeling of Stress : Only a little  ?Social Connections: Moderately Integrated  ? Frequency of Communication with Friends and Family: More than three times a week  ? Frequency of Social Gatherings with Friends and Family: More than three times a week  ? Attends Religious Services: More than 4  times per year  ? Active Member of Clubs or Organizations: Yes  ? Attends Archivist Meetings: More than 4 times per year  ? Marital Status: Separated  ? ?Outpatient Encounter Medications as of 03/18/2022  ?Medication Sig  ? acetaminophen (TYLENOL) 500 MG tablet Take 1,000 mg by mouth every  6 (six) hours as needed for mild pain or headache. Take 1,000 mg by mouth every six to eight hours as needed for pain or headaches  ? amLODipine (NORVASC) 5 MG tablet Take 1 tablet (5 mg total) by mouth at bedtime.  ? aspirin EC 81 MG EC tablet Take 1 tablet (81 mg total) by mouth daily. (Patient taking differently: Take 81 mg by mouth in the morning.)  ? atorvastatin (LIPITOR) 10 MG tablet Take 1 tablet by mouth once daily  ? Cholecalciferol (VITAMIN D3 PO) Take 2,000 Int'l Units/day by mouth.  ? Cyanocobalamin (B-12) 1000 MCG CAPS Take 1,000 mcg by mouth daily.  ? fexofenadine-pseudoephedrine (ALLEGRA-D 24) 180-240 MG 24 hr tablet Take 1 tablet by mouth daily.  ? lactulose (CHRONULAC) 10 GM/15ML solution TAKE 15 MLS BY MOUTH DAILY  ? Omega-3 Fatty Acids (FISH OIL PO) Take 1 capsule by mouth in the morning.   ? sodium zirconium cyclosilicate (LOKELMA) 10 g PACK packet Take 10 g by mouth daily. Daily x 3 doses until K recheck per nephro (Patient not taking: Reported on 03/18/2022)  ? ?No facility-administered encounter medications on file as of 03/18/2022.  ? ?Activities of Daily Living ?In your present state of health, do you have any difficulty performing the following activities: 03/18/2022  ?Hearing? N  ?Vision? N  ?Difficulty concentrating or making decisions? N  ?Walking or climbing stairs? N  ?Dressing or bathing? N  ?Doing errands, shopping? N  ?Preparing Food and eating ? N  ?Using the Toilet? N  ?In the past six months, have you accidently leaked urine? N  ?Do you have problems with loss of bowel control? N  ?Managing your Medications? N  ?Managing your Finances? N  ?Housekeeping or managing your Housekeeping? N   ?Some recent data might be hidden  ? ?Patient Care Team: ?Leeanne Rio, MD as PCP - General (Family Medicine) ?Jettie Booze, MD as PCP - Cardiology (Cardiology) ?Rexene Agent, MD as Carolynn Serve

## 2022-03-18 NOTE — Progress Notes (Signed)
I have reviewed this visit and agree with the documentation.  ?Leeanne Rio, MD  ?

## 2022-03-18 NOTE — Patient Instructions (Addendum)
Thank you for taking time to come for your Medicare Wellness Visit. I appreciate your ongoing commitment to your health goals. Please review the following plan we discussed and let me know if I can assist you in the future.  ?  ?These are the goals we discussed: ? ? Goals   ? ?   Acknowledge receipt of Advanced Directive package   ? ?  ? ?We also discussed recommended health maintenance. As discussed, you are up to date with everything! ?Health Maintenance  ?Topic Date Due  ? MAMMOGRAM  01/31/2024  ? COLONOSCOPY (Pts 45-22yr Insurance coverage will need to be confirmed)  07/11/2025  ? TETANUS/TDAP  01/08/2032  ? Pneumonia Vaccine 69 Years old  Completed  ? INFLUENZA VACCINE  Completed  ? DEXA SCAN  Completed  ? COVID-19 Vaccine  Completed  ? Hepatitis C Screening  Completed  ? Zoster Vaccines- Shingrix  Completed  ? HPV VACCINES  Aged Out  ? ?PCP apt 4/27. ?Fill out the advance directive we discussed.  ?Keep up the great work walking daily! ? ?Preventive Care 665Years and Older, Female ?Preventive care refers to lifestyle choices and visits with your health care provider that can promote health and wellness. Preventive care visits are also called wellness exams. ?What can I expect for my preventive care visit? ?Counseling ?Your health care provider may ask you questions about your: ?Medical history, including: ?Past medical problems. ?Family medical history. ?Pregnancy and menstrual history. ?History of falls. ?Current health, including: ?Memory and ability to understand (cognition). ?Emotional well-being. ?Home life and relationship well-being. ?Sexual activity and sexual health. ?Lifestyle, including: ?Alcohol, nicotine or tobacco, and drug use. ?Access to firearms. ?Diet, exercise, and sleep habits. ?Work and work eStatistician ?Sunscreen use. ?Safety issues such as seatbelt and bike helmet use. ?Physical exam ?Your health care provider will check your: ?Height and weight. These may be used to calculate your BMI  (body mass index). BMI is a measurement that tells if you are at a healthy weight. ?Waist circumference. This measures the distance around your waistline. This measurement also tells if you are at a healthy weight and may help predict your risk of certain diseases, such as type 2 diabetes and high blood pressure. ?Heart rate and blood pressure. ?Body temperature. ?Skin for abnormal spots. ?What immunizations do I need? ?Vaccines are usually given at various ages, according to a schedule. Your health care provider will recommend vaccines for you based on your age, medical history, and lifestyle or other factors, such as travel or where you work. ?What tests do I need? ?Screening ?Your health care provider may recommend screening tests for certain conditions. This may include: ?Lipid and cholesterol levels. ?Hepatitis C test. ?Hepatitis B test. ?HIV (human immunodeficiency virus) test. ?STI (sexually transmitted infection) testing, if you are at risk. ?Lung cancer screening. ?Colorectal cancer screening. ?Diabetes screening. This is done by checking your blood sugar (glucose) after you have not eaten for a while (fasting). ?Mammogram. Talk with your health care provider about how often you should have regular mammograms. ?BRCA-related cancer screening. This may be done if you have a family history of breast, ovarian, tubal, or peritoneal cancers. ?Bone density scan. This is done to screen for osteoporosis. ?Talk with your health care provider about your test results, treatment options, and if necessary, the need for more tests. ?Follow these instructions at home: ?Eating and drinking ? ?Eat a diet that includes fresh fruits and vegetables, whole grains, lean protein, and low-fat dairy products. Limit your  intake of foods with high amounts of sugar, saturated fats, and salt. ?Take vitamin and mineral supplements as recommended by your health care provider. ?Do not drink alcohol if your health care provider tells you  not to drink. ?If you drink alcohol: ?Limit how much you have to 0-1 drink a day. ?Know how much alcohol is in your drink. In the [place] one drink equals one 12 oz bottle of beer (355 mL), one 5 oz glass of wine (148 mL), or one 1? oz glass of hard liquor (44 mL). ?Lifestyle ?Brush your teeth every morning and night with fluoride toothpaste. Floss one time each day. ?Exercise for at least 30 minutes 5 or more days each week. ?Do not use any products that contain nicotine or tobacco. These products include cigarettes, chewing tobacco, and vaping devices, such as e-cigarettes. If you need help quitting, ask your health care provider. ?Do not use drugs. ?If you are sexually active, practice safe sex. Use a condom or other form of protection in order to prevent STIs. ?Take aspirin only as told by your health care provider. Make sure that you understand how much to take and what form to take. Work with your health care provider to find out whether it is safe and beneficial for you to take aspirin daily. ?Ask your health care provider if you need to take a cholesterol-lowering medicine (statin). ?Find healthy ways to manage stress, such as: ?Meditation, yoga, or listening to music. ?Journaling. ?Talking to a trusted person. ?Spending time with friends and family. ?Minimize exposure to UV radiation to reduce your risk of skin cancer. ?Safety ?Always wear your seat belt while driving or riding in a vehicle. ?Do not drive: ?If you have been drinking alcohol. Do not ride with someone who has been drinking. ?When you are tired or distracted. ?While texting. ?If you have been using any mind-altering substances or drugs. ?Wear a helmet and other protective equipment during sports activities. ?If you have firearms in your house, make sure you follow all gun safety procedures. ?What's next? ?Visit your health care provider once a year for an annual wellness visit. ?Ask your health care provider how often you should have your eyes  and teeth checked. ?Stay up to date on all vaccines. ?This information is not intended to replace advice given to you by your health care provider. Make sure you discuss any questions you have with your health care provider. ?Document Revised: 06/12/2021 Document Reviewed: 06/12/2021 ?Elsevier Patient Education ? [place] [facility]. ? ?Our clinic's number is [phone]. Please call with questions or concerns about what we discussed today. ? ?  ? ?

## 2022-04-24 ENCOUNTER — Encounter: Payer: Self-pay | Admitting: Family Medicine

## 2022-04-24 ENCOUNTER — Ambulatory Visit (INDEPENDENT_AMBULATORY_CARE_PROVIDER_SITE_OTHER): Payer: Medicare Other | Admitting: Family Medicine

## 2022-04-24 VITALS — BP 122/80 | HR 93 | Temp 98.2°F | Wt 129.8 lb

## 2022-04-24 DIAGNOSIS — I10 Essential (primary) hypertension: Secondary | ICD-10-CM

## 2022-04-24 DIAGNOSIS — K5900 Constipation, unspecified: Secondary | ICD-10-CM | POA: Diagnosis not present

## 2022-04-24 DIAGNOSIS — G8929 Other chronic pain: Secondary | ICD-10-CM

## 2022-04-24 DIAGNOSIS — R519 Headache, unspecified: Secondary | ICD-10-CM | POA: Diagnosis not present

## 2022-04-24 MED ORDER — AMLODIPINE BESYLATE 5 MG PO TABS: 5.0000 mg | ORAL_TABLET | Freq: Every day | ORAL | 3 refills | Status: DC

## 2022-04-24 MED ORDER — NORTRIPTYLINE HCL 10 MG PO CAPS: 10.0000 mg | ORAL_CAPSULE | Freq: Every day | ORAL | 1 refills | Status: DC

## 2022-04-24 MED ORDER — LACTULOSE 10 GM/15ML PO SOLN: ORAL | 3 refills | Status: DC

## 2022-04-24 MED ORDER — FLUTICASONE PROPIONATE 50 MCG/ACT NA SUSP: 2.0000 | Freq: Every day | NASAL | 6 refills | Status: DC

## 2022-04-24 NOTE — Patient Instructions (Signed)
Referring to neurologist ? ?For headaches and pain, start nortriptyline '10mg'$  at bedtime ?Avoid the benadryl ? ?Refilled medications ?Sent in flonase for allergies ? ?Follow up with me in June to see how you're doing ? ?Be well, ?Dr. Ardelia Mems  ?

## 2022-04-24 NOTE — Progress Notes (Signed)
?  Date of Visit: 04/24/2022  ? ?SUBJECTIVE:  ? ?HPI: ? ?Susen presents today for a routine physical exam. ? ?No current smoking - quit 2015. ?Occasional alcohol. ?Walks for exercise, 30 mins per week. ?No history of IVDU ?No mood concerns  ? ?Continues to have intermittent headaches, same as in past. ? ?Having L shoulder/arm pain after getting Tdap in the left arm. ? ?Also reports R thigh pain that causes burning in her leg and tingling in her toes. Occurs especially at night about 3-4 times per week. Very bothersome. ? ?Hypertension - taking amlodipine '5mg'$  daily. Needs refill ? ?Constipation - uses as needed lactulose which helps. Needs refill. ? ?Seasonal allergies - having some increased congestion lately. Taking some benadryl. ? ?Had ABIs done by insurance company NP at her home, these were abnormal - right 0.91 and left 0.78. ? ?OBJECTIVE:  ? ?BP 122/80   Pulse 93   Temp 98.2 ?F (36.8 ?C)   Wt 129 lb 12.8 oz (58.9 kg)   SpO2 98%   BMI 25.35 kg/m?  ?Gen: no acute distress pleasant cooperative ?HEENT: normocephalic, atraumatic  ?Heart: regular rate and rhythm, no murmru ?Lungs: clear to auscultation bilaterally, normal work of breathing  ?Neuro: alert, speech normal, grossly nonfocal, full strenght bilateral lower extremities, sensation intact over bilateral lower extremities. ?Extremities: no abnormalities of L shoulder appreciated. 2+ dp pulses in bilateral feet ? ?ASSESSMENT/PLAN:  ? ?Health maintenance:  ?-current on HM items ? ?Headache ?Persisting. Prior normal MRI. Will start nortriptyline '10mg'$  daily for headache prophylaxis. Patient daughter would like referral to neuro; reasonable given duration of headaches. Will place referral for this as well as R leg pain/tingling. ? 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 Starting nortriptyline as above. If still bothersome at time of neuro appointment can discuss with neurologist, may benefit from NCS. ? ?ABIs  done here, with scores of 1.29 and 1.25, both normal. Suspect false positive home testing done by insurance NP. ? ?Constipation ?Well controlled. Continue current medication regimen.  ? ?Hypertension ?Well controlled. Continue current medication regimen.  ? ?FOLLOW UP: ?Follow up in 6 weeks for above issues ?Referring to neurology ? ?[place]. Ardelia Mems, MD ?[facility] ?

## 2022-04-30 NOTE — Assessment & Plan Note (Signed)
Well controlled. Continue current medication regimen.  

## 2022-05-01 ENCOUNTER — Encounter: Payer: Self-pay | Admitting: Neurology

## 2022-06-03 ENCOUNTER — Encounter: Payer: Self-pay | Admitting: *Deleted

## 2022-06-09 ENCOUNTER — Ambulatory Visit (INDEPENDENT_AMBULATORY_CARE_PROVIDER_SITE_OTHER): Payer: Medicare Other | Admitting: Family Medicine

## 2022-06-09 ENCOUNTER — Encounter: Payer: Self-pay | Admitting: Family Medicine

## 2022-06-09 DIAGNOSIS — I10 Essential (primary) hypertension: Secondary | ICD-10-CM | POA: Diagnosis not present

## 2022-06-09 DIAGNOSIS — R519 Headache, unspecified: Secondary | ICD-10-CM

## 2022-06-09 MED ORDER — NORTRIPTYLINE HCL 25 MG PO CAPS: 25.0000 mg | ORAL_CAPSULE | Freq: Every day | ORAL | 1 refills | Status: DC

## 2022-06-09 MED ORDER — AMLODIPINE BESYLATE 10 MG PO TABS: 10.0000 mg | ORAL_TABLET | Freq: Every day | ORAL | 3 refills | Status: DC

## 2022-06-09 NOTE — Patient Instructions (Addendum)
It was great to see you again today!  Go up to '25mg'$  daily of nortriptyline. Sent this in for you.  Go up to '10mg'$  daily of amlodipine for blood pressure. Sent this in for you.  Follow up with me in 6 weeks, sooner if needed  Be well, Dr. Ardelia Mems

## 2022-06-09 NOTE — Assessment & Plan Note (Signed)
Improved with addition of nortriptyline.  We will increase to 25 mg daily for hopefully even better improvement.  Follow-up in 6 weeks.

## 2022-06-09 NOTE — Assessment & Plan Note (Signed)
Elevated on recheck today.  Gave option of monitoring at home versus increasing amlodipine.  Patient opts to increase amlodipine dose.  Increase to 10 mg daily.  Follow-up in 6 weeks, appointment scheduled.

## 2022-06-09 NOTE — Progress Notes (Signed)
  Date of Visit: 06/09/2022   SUBJECTIVE:   HPI:  Jennifer Moss presents today for routine follow-up.  Headache: Last visit we started nortriptyline 10 mg daily for headache prophylaxis.  We also referred her to neurology.  She has an appointment with neurology scheduled for September 27.  She reports improvement in her headache since starting the nortriptyline.  Things are better, but feels there is room for continued improvement.  She is tolerating this medication well.  Hypertension: Taking amlodipine 5 mg daily.  Has checked blood pressure at home and it has typically been in the 120s over 80s.  Did have an elevated reading at home the other day of 130s over 90s.  OBJECTIVE:   BP (!) 132/106   Pulse 76   Wt 131 lb 6.4 oz (59.6 kg)   SpO2 100%   BMI 25.66 kg/m  Gen: no acute distress, pleasant, cooperative HEENT: normocephalic, atraumatic  Heart: regular rate and rhythm, no murmur Lungs: clear to auscultation bilaterally, normal work of breathing  Neuro: alert, speech normal, grossly nonfocal Ext: No appreciable lower extremity edema bilaterally   ASSESSMENT/PLAN:   Health maintenance:  -UTD on HM items  Hypertension Elevated on recheck today.  Gave option of monitoring at home versus increasing amlodipine.  Patient opts to increase amlodipine dose.  Increase to 10 mg daily.  Follow-up in 6 weeks, appointment scheduled.  Headache Improved with addition of nortriptyline.  We will increase to 25 mg daily for hopefully even better improvement.  Follow-up in 6 weeks.  FOLLOW UP: Follow up in 6 wks for headache and hypertension   [place] J. Ardelia Mems, [place]

## 2022-07-14 ENCOUNTER — Encounter: Payer: Self-pay | Admitting: Family Medicine

## 2022-07-14 ENCOUNTER — Ambulatory Visit (INDEPENDENT_AMBULATORY_CARE_PROVIDER_SITE_OTHER): Payer: Medicare Other | Admitting: Family Medicine

## 2022-07-14 VITALS — BP 108/88 | HR 95 | Ht 60.0 in | Wt 132.8 lb

## 2022-07-14 DIAGNOSIS — I10 Essential (primary) hypertension: Secondary | ICD-10-CM | POA: Diagnosis not present

## 2022-07-14 DIAGNOSIS — R519 Headache, unspecified: Secondary | ICD-10-CM | POA: Diagnosis not present

## 2022-07-14 NOTE — Patient Instructions (Signed)
It was great to see you again today!  Stay on current medications Follow up in 3 months   Be well, Dr. Ardelia Mems

## 2022-07-14 NOTE — Progress Notes (Signed)
  Date of Visit: 07/14/2022   SUBJECTIVE:   HPI:  Jennifer Moss presents today for follow up of headaches and hypertension.  Hypertension - taking amlodipine '10mg'$  daily.  Tolerating well.  We increased from 5 mg at her last visit.  Headache-taking nortriptyline 25 mg at bedtime.  Has noticed some fatigue with this especially in the morning.  Also has noticed weight gain of several pounds since we started this at the end of April.  Has not been walking like she normally does.  Generally tries to eat healthy.  Headaches are improved significantly with this medication.  Still has sharp pain on the left side of her head, chronic for many years, but overall improved.  OBJECTIVE:   BP 108/88   Pulse 95   Ht 5' (1.524 m)   Wt 132 lb 12.8 oz (60.2 kg)   SpO2 100%   BMI 25.94 kg/m  Gen: no acute distress, pleasant, cooperative HEENT: normocephalic, atraumatic  Heart: regular rate and rhythm, no murmur Lungs: clear to auscultation bilaterally, normal work of breathing  Neuro: alert, speech normal, grossly nonfocal Ext: No appreciable lower extremity edema bilaterally   ASSESSMENT/PLAN:   Health maintenance:  -UTD on HM items  Hypertension Well controlled. Continue current medication regimen.   Headache Improved on higher dose of nortriptyline.  Continue this medication.  Discussed weight management techniques including regular exercise and healthy eating.  Follow-up in 3 months.  FOLLOW UP: Follow up in 3 months for routine medical problems  [place] J. Ardelia Mems, [place]

## 2022-07-14 NOTE — Assessment & Plan Note (Signed)
Well controlled. Continue current medication regimen.  

## 2022-07-14 NOTE — Assessment & Plan Note (Signed)
Improved on higher dose of nortriptyline.  Continue this medication.  Discussed weight management techniques including regular exercise and healthy eating.  Follow-up in 3 months.

## 2022-07-24 DIAGNOSIS — D631 Anemia in chronic kidney disease: Secondary | ICD-10-CM | POA: Diagnosis not present

## 2022-07-24 DIAGNOSIS — N2581 Secondary hyperparathyroidism of renal origin: Secondary | ICD-10-CM | POA: Diagnosis not present

## 2022-07-24 DIAGNOSIS — Q631 Lobulated, fused and horseshoe kidney: Secondary | ICD-10-CM | POA: Diagnosis not present

## 2022-07-24 DIAGNOSIS — I129 Hypertensive chronic kidney disease with stage 1 through stage 4 chronic kidney disease, or unspecified chronic kidney disease: Secondary | ICD-10-CM | POA: Diagnosis not present

## 2022-07-24 DIAGNOSIS — N183 Chronic kidney disease, stage 3 unspecified: Secondary | ICD-10-CM | POA: Diagnosis not present

## 2022-07-27 ENCOUNTER — Other Ambulatory Visit: Payer: Self-pay | Admitting: Family Medicine

## 2022-08-11 DIAGNOSIS — N183 Chronic kidney disease, stage 3 unspecified: Secondary | ICD-10-CM | POA: Diagnosis not present

## 2022-08-12 DIAGNOSIS — H25813 Combined forms of age-related cataract, bilateral: Secondary | ICD-10-CM | POA: Diagnosis not present

## 2022-09-24 ENCOUNTER — Ambulatory Visit: Payer: Medicare Other | Admitting: Neurology

## 2022-09-25 ENCOUNTER — Other Ambulatory Visit: Payer: Self-pay | Admitting: Family Medicine

## 2022-10-14 ENCOUNTER — Encounter: Payer: Self-pay | Admitting: Family Medicine

## 2022-10-14 ENCOUNTER — Ambulatory Visit (INDEPENDENT_AMBULATORY_CARE_PROVIDER_SITE_OTHER): Payer: Medicare Other | Admitting: Family Medicine

## 2022-10-14 VITALS — BP 112/80 | HR 88 | Temp 98.5°F | Ht 60.0 in | Wt 141.6 lb

## 2022-10-14 DIAGNOSIS — E785 Hyperlipidemia, unspecified: Secondary | ICD-10-CM

## 2022-10-14 DIAGNOSIS — R519 Headache, unspecified: Secondary | ICD-10-CM

## 2022-10-14 DIAGNOSIS — K5900 Constipation, unspecified: Secondary | ICD-10-CM

## 2022-10-14 DIAGNOSIS — N1832 Chronic kidney disease, stage 3b: Secondary | ICD-10-CM

## 2022-10-14 DIAGNOSIS — I10 Essential (primary) hypertension: Secondary | ICD-10-CM

## 2022-10-14 MED ORDER — LACTULOSE 10 GM/15ML PO SOLN: 10.0000 g | Freq: Every day | ORAL | 11 refills | Status: DC

## 2022-10-14 NOTE — Progress Notes (Signed)
  Date of Visit: 10/14/2022   SUBJECTIVE:   HPI:  Jennifer Moss presents today for routine follow up.  Hypertension- taking amlodipine '10mg'$  daily, tolerating well  Headaches - taking nortriptyline '25mg'$  at night. Thinks this has made her gain weight which she does not like, but does note headaches are continuing to do well with this medication so prefers to take it.  Hyperlipidemia - taking lipitor '10mg'$  daily. Agreeable to lab check today.  Allergies - taking flonase, allegra with good relief of symptoms   Constipation - taking lactulose 10g daily with good relief of symptoms    OBJECTIVE:   BP 112/80   Pulse 88   Temp 98.5 F (36.9 C)   Ht 5' (1.524 m)   Wt 141 lb 9.6 oz (64.2 kg)   SpO2 98%   BMI 27.65 kg/m  Gen: no acute distress, pleasant, cooperative HEENT: normocephalic, atraumatic  Heart: regular rate and rhythm, no murmur Lungs: clear to auscultation bilaterally, normal work of breathing  Neuro: alert, speech normal grossly nonfocal Ext: No appreciable lower extremity edema bilaterally   ASSESSMENT/PLAN:   Health maintenance:  -current on COVID and flu vaccines  Constipation Well controlled, continue lactulose  Chronic kidney disease, stage 3 ([place]) Update BMET today to assess Cr and GFR  Headache Continues to do well on nortriptyline. Discussed increasing physical activity to help combat weight gain. Continue this medication since symptoms are doing so well on it.   Hypertension Well controlled. Continue current medication regimen.   Hyperlipidemia BMET and lipid panel today. Continue statin.  FOLLOW UP: Follow up in 6 mos for chronic issues  Jennifer Moss, [place]

## 2022-10-14 NOTE — Patient Instructions (Signed)
It was great to see you again today!  Checking cholesterol and kidneys today Work on staying active physically - let's see if that helps your weight Follow up with me in 6 months   Be well, Dr. Ardelia Mems

## 2022-10-15 LAB — BASIC METABOLIC PANEL
BUN/Creatinine Ratio: 11 — ABNORMAL LOW (ref 12–28)
BUN: 20 mg/dL (ref 8–27)
CO2: 21 mmol/L (ref 20–29)
Calcium: 9.5 mg/dL (ref 8.7–10.3)
Chloride: 103 mmol/L (ref 96–106)
Creatinine, Ser: 1.9 mg/dL — ABNORMAL HIGH (ref 0.57–1.00)
Glucose: 102 mg/dL — ABNORMAL HIGH (ref 70–99)
Potassium: 4.2 mmol/L (ref 3.5–5.2)
Sodium: 142 mmol/L (ref 134–144)
eGFR: 28 mL/min/{1.73_m2} — ABNORMAL LOW (ref >59)

## 2022-10-15 LAB — LIPID PANEL
Chol/HDL Ratio: 2 ratio (ref 0.0–4.4)
Cholesterol, Total: 176 mg/dL (ref 100–199)
HDL: 89 mg/dL (ref >39)
LDL Chol Calc (NIH): 59 mg/dL (ref 0–99)
Triglycerides: 172 mg/dL — ABNORMAL HIGH (ref 0–149)
VLDL Cholesterol Cal: 28 mg/dL (ref 5–40)

## 2022-10-15 NOTE — Assessment & Plan Note (Signed)
BMET and lipid panel today. Continue statin.

## 2022-10-15 NOTE — Assessment & Plan Note (Signed)
Update BMET today to assess Cr and GFR

## 2022-10-15 NOTE — Assessment & Plan Note (Signed)
Well controlled, continue lactulose

## 2022-10-15 NOTE — Assessment & Plan Note (Signed)
Continues to do well on nortriptyline. Discussed increasing physical activity to help combat weight gain. Continue this medication since symptoms are doing so well on it.

## 2022-10-15 NOTE — Assessment & Plan Note (Signed)
Well controlled. Continue current medication regimen.  

## 2022-10-17 ENCOUNTER — Telehealth: Payer: Self-pay

## 2022-10-17 DIAGNOSIS — N1832 Chronic kidney disease, stage 3b: Secondary | ICD-10-CM

## 2022-10-17 NOTE — Telephone Encounter (Signed)
Called patient. Advised lipids overall look good. Renal function down from 10 months ago. Will repeat on Monday. Lab visit scheduled  Patient appreciative Leeanne Rio, MD

## 2022-10-17 NOTE — Telephone Encounter (Signed)
Patient calls nurse line requesting returned call from provider regarding results from visit on 10/17.  Talbot Grumbling, RN

## 2022-10-20 ENCOUNTER — Other Ambulatory Visit: Payer: Medicare Other

## 2022-10-20 DIAGNOSIS — N1832 Chronic kidney disease, stage 3b: Secondary | ICD-10-CM

## 2022-10-21 LAB — BASIC METABOLIC PANEL
BUN/Creatinine Ratio: 10 — ABNORMAL LOW (ref 12–28)
BUN: 20 mg/dL (ref 8–27)
CO2: 18 mmol/L — ABNORMAL LOW (ref 20–29)
Calcium: 9.6 mg/dL (ref 8.7–10.3)
Chloride: 105 mmol/L (ref 96–106)
Creatinine, Ser: 1.95 mg/dL — ABNORMAL HIGH (ref 0.57–1.00)
Glucose: 107 mg/dL — ABNORMAL HIGH (ref 70–99)
Potassium: 4.2 mmol/L (ref 3.5–5.2)
Sodium: 144 mmol/L (ref 134–144)
eGFR: 28 mL/min/{1.73_m2} — ABNORMAL LOW (ref >59)

## 2022-10-24 ENCOUNTER — Other Ambulatory Visit: Payer: Self-pay | Admitting: Family Medicine

## 2022-12-17 ENCOUNTER — Other Ambulatory Visit: Payer: Self-pay | Admitting: Family Medicine

## 2023-01-05 ENCOUNTER — Other Ambulatory Visit: Payer: Self-pay | Admitting: Family Medicine

## 2023-01-05 DIAGNOSIS — Z1231 Encounter for screening mammogram for malignant neoplasm of breast: Secondary | ICD-10-CM

## 2023-01-12 DIAGNOSIS — N183 Chronic kidney disease, stage 3 unspecified: Secondary | ICD-10-CM | POA: Diagnosis not present

## 2023-01-12 DIAGNOSIS — D631 Anemia in chronic kidney disease: Secondary | ICD-10-CM | POA: Diagnosis not present

## 2023-01-12 DIAGNOSIS — I129 Hypertensive chronic kidney disease with stage 1 through stage 4 chronic kidney disease, or unspecified chronic kidney disease: Secondary | ICD-10-CM | POA: Diagnosis not present

## 2023-01-12 DIAGNOSIS — N2581 Secondary hyperparathyroidism of renal origin: Secondary | ICD-10-CM | POA: Diagnosis not present

## 2023-01-12 DIAGNOSIS — N189 Chronic kidney disease, unspecified: Secondary | ICD-10-CM | POA: Diagnosis not present

## 2023-01-27 DIAGNOSIS — N183 Chronic kidney disease, stage 3 unspecified: Secondary | ICD-10-CM | POA: Diagnosis not present

## 2023-02-26 ENCOUNTER — Ambulatory Visit: Payer: 59

## 2023-04-09 ENCOUNTER — Ambulatory Visit
Admission: RE | Admit: 2023-04-09 | Discharge: 2023-04-09 | Disposition: A | Discharge status: Home / Self Care | Payer: 59 | Source: Ambulatory Visit | Attending: Family Medicine | Admitting: Family Medicine

## 2023-04-09 DIAGNOSIS — Z1231 Encounter for screening mammogram for malignant neoplasm of breast: Secondary | ICD-10-CM

## 2023-04-13 ENCOUNTER — Telehealth: Payer: Self-pay | Admitting: Family Medicine

## 2023-04-13 NOTE — Telephone Encounter (Signed)
Contacted Jennifer Moss to schedule their annual wellness visit. Appointment made for 04/16/2023.  Thank you,  [facility] Support [facility] Direct dial  [phone]

## 2023-04-16 ENCOUNTER — Ambulatory Visit (INDEPENDENT_AMBULATORY_CARE_PROVIDER_SITE_OTHER): Payer: 59

## 2023-04-16 VITALS — Ht 60.0 in | Wt 141.0 lb

## 2023-04-16 DIAGNOSIS — Z0000 Encounter for general adult medical examination without abnormal findings: Secondary | ICD-10-CM | POA: Diagnosis not present

## 2023-04-16 NOTE — Patient Instructions (Addendum)
Jennifer Moss , Thank you for taking time to come for your Medicare Wellness Visit. I appreciate your ongoing commitment to your health goals. Please review the following plan we discussed and let me know if I can assist you in the future.   These are the goals we discussed:  Goals        Acknowledge receipt of Advanced Directive package      Lose weight (pt-stated)      Stay active.        This is a list of the screening recommended for you and due dates:  Health Maintenance  Topic Date Due   COVID-19 Vaccine (7 - 2023-24 season) 05/02/2023*   Flu Shot  07/30/2023   Medicare Annual Wellness Visit  04/15/2024   Mammogram  04/08/2025   Colon Cancer Screening  07/11/2025   DTaP/Tdap/Td vaccine (3 - Td or Tdap) 01/08/2032   Pneumonia Vaccine  Completed   DEXA scan (bone density measurement)  Completed   Hepatitis C Screening: USPSTF Recommendation to screen - Ages 69-79 yo.  Completed   Zoster (Shingles) Vaccine  Completed   HPV Vaccine  Aged Out  *Topic was postponed. The date shown is not the original due date.    Advanced directives: Advance directive discussed with you today. Even though you declined this today, please call our office should you change your mind, and we can give you the proper paperwork for you to fill out.   Conditions/risks identified: None  Next appointment: Follow up in one year for your annual wellness visit    Preventive Care 65 Years and Older, Female Preventive care refers to lifestyle choices and visits with your health care provider that can promote health and wellness. What does preventive care include? A yearly physical exam. This is also called an annual well check. Dental exams once or twice a year. Routine eye exams. Ask your health care provider how often you should have your eyes checked. Personal lifestyle choices, including: Daily care of your teeth and gums. Regular physical activity. Eating a healthy diet. Avoiding tobacco and drug  use. Limiting alcohol use. Practicing safe sex. Taking low-dose aspirin every day. Taking vitamin and mineral supplements as recommended by your health care provider. What happens during an annual well check? The services and screenings done by your health care provider during your annual well check will depend on your age, overall health, lifestyle risk factors, and family history of disease. Counseling  Your health care provider may ask you questions about your: Alcohol use. Tobacco use. Drug use. Emotional well-being. Home and relationship well-being. Sexual activity. Eating habits. History of falls. Memory and ability to understand (cognition). Work and work Astronomer. Reproductive health. Screening  You may have the following tests or measurements: Height, weight, and BMI. Blood pressure. Lipid and cholesterol levels. These may be checked every 5 years, or more frequently if you are over 55 years old. Skin check. Lung cancer screening. You may have this screening every year starting at age 60 if you have a 30-pack-year history of smoking and currently smoke or have quit within the past 15 years. Fecal occult blood test (FOBT) of the stool. You may have this test every year starting at age 2. Flexible sigmoidoscopy or colonoscopy. You may have a sigmoidoscopy every 5 years or a colonoscopy every 10 years starting at age 40. Hepatitis C blood test. Hepatitis B blood test. Sexually transmitted disease (STD) testing. Diabetes screening. This is done by checking your blood sugar (glucose)  after you have not eaten for a while (fasting). You may have this done every 1-3 years. Bone density scan. This is done to screen for osteoporosis. You may have this done starting at age 40. Mammogram. This may be done every 1-2 years. Talk to your health care provider about how often you should have regular mammograms. Talk with your health care provider about your test results, treatment  options, and if necessary, the need for more tests. Vaccines  Your health care provider may recommend certain vaccines, such as: Influenza vaccine. This is recommended every year. Tetanus, diphtheria, and acellular pertussis (Tdap, Td) vaccine. You may need a Td booster every 10 years. Zoster vaccine. You may need this after age 68. Pneumococcal 13-valent conjugate (PCV13) vaccine. One dose is recommended after age 84. Pneumococcal polysaccharide (PPSV23) vaccine. One dose is recommended after age 54. Talk to your health care provider about which screenings and vaccines you need and how often you need them. This information is not intended to replace advice given to you by your health care provider. Make sure you discuss any questions you have with your health care provider. Document Released: 01/11/2016 Document Revised: 09/03/2016 Document Reviewed: 10/16/2015 Elsevier Interactive Patient Education  2017 [place] Prevention in the Home Falls can cause injuries. They can happen to people of all ages. There are many things you can do to make your home safe and to help prevent falls. What can I do on the outside of my home? Regularly fix the edges of walkways and driveways and fix any cracks. Remove anything that might make you trip as you walk through a door, such as a raised step or threshold. Trim any bushes or trees on the path to your home. Use bright outdoor lighting. Clear any walking paths of anything that might make someone trip, such as rocks or tools. Regularly check to see if handrails are loose or broken. Make sure that both sides of any steps have handrails. Any raised decks and porches should have guardrails on the edges. Have any leaves, snow, or ice cleared regularly. Use sand or salt on walking paths during winter. Clean up any spills in your garage right away. This includes oil or grease spills. What can I do in the bathroom? Use night lights. Install grab  bars by the toilet and in the tub and shower. Do not use towel bars as grab bars. Use non-skid mats or decals in the tub or shower. If you need to sit down in the shower, use a plastic, non-slip stool. Keep the floor dry. Clean up any water that spills on the floor as soon as it happens. Remove soap buildup in the tub or shower regularly. Attach bath mats securely with double-sided non-slip rug tape. Do not have throw rugs and other things on the floor that can make you trip. What can I do in the bedroom? Use night lights. Make sure that you have a light by your bed that is easy to reach. Do not use any sheets or blankets that are too big for your bed. They should not hang down onto the floor. Have a firm chair that has side arms. You can use this for support while you get dressed. Do not have throw rugs and other things on the floor that can make you trip. What can I do in the kitchen? Clean up any spills right away. Avoid walking on wet floors. Keep items that you use a lot in easy-to-reach places. If  you need to reach something above you, use a strong step stool that has a grab bar. Keep electrical cords out of the way. Do not use floor polish or wax that makes floors slippery. If you must use wax, use non-skid floor wax. Do not have throw rugs and other things on the floor that can make you trip. What can I do with my stairs? Do not leave any items on the stairs. Make sure that there are handrails on both sides of the stairs and use them. Fix handrails that are broken or loose. Make sure that handrails are as long as the stairways. Check any carpeting to make sure that it is firmly attached to the stairs. Fix any carpet that is loose or worn. Avoid having throw rugs at the top or bottom of the stairs. If you do have throw rugs, attach them to the floor with carpet tape. Make sure that you have a light switch at the top of the stairs and the bottom of the stairs. If you do not have them,  ask someone to add them for you. What else can I do to help prevent falls? Wear shoes that: Do not have high heels. Have rubber bottoms. Are comfortable and fit you well. Are closed at the toe. Do not wear sandals. If you use a stepladder: Make sure that it is fully opened. Do not climb a closed stepladder. Make sure that both sides of the stepladder are locked into place. Ask someone to hold it for you, if possible. Clearly mark and make sure that you can see: Any grab bars or handrails. First and last steps. Where the edge of each step is. Use tools that help you move around (mobility aids) if they are needed. These include: Canes. Walkers. Scooters. Crutches. Turn on the lights when you go into a dark area. Replace any light bulbs as soon as they burn out. Set up your furniture so you have a clear path. Avoid moving your furniture around. If any of your floors are uneven, fix them. If there are any pets around you, be aware of where they are. Review your medicines with your doctor. Some medicines can make you feel dizzy. This can increase your chance of falling. Ask your doctor what other things that you can do to help prevent falls. This information is not intended to replace advice given to you by your health care provider. Make sure you discuss any questions you have with your health care provider. Document Released: 10/11/2009 Document Revised: 05/22/2016 Document Reviewed: 01/19/2015 Elsevier Interactive Patient Education  2017 [employer].

## 2023-04-16 NOTE — Progress Notes (Addendum)
Subjective:   Jennifer Moss is a 72 y.o. female who presents for Medicare Annual (Subsequent) preventive examination.  Review of Systems    Virtual Visit via Telephone Note  I connected with  VIDHI DELELLIS on 04/16/23 at  8:15 AM EDT by telephone and verified that I am speaking with the correct person using two identifiers.  Location: Patient: Home Provider: Office Persons participating in the virtual visit: patient/Nurse Health Advisor   I discussed the limitations, risks, security and privacy concerns of performing an evaluation and management service by telephone and the availability of in person appointments. The patient expressed understanding and agreed to proceed.  Interactive audio and video telecommunications were attempted between this nurse and patient, however failed, due to patient having technical difficulties OR patient did not have access to video capability.  We continued and completed visit with audio only.  Some vital signs may be absent or patient reported.   Tillie Rung, LPN  Cardiac Risk Factors include: advanced age (>75men, >6 women);hypertension     Objective:    Today's Vitals   04/16/23 0822  Weight: 141 lb (64 kg)  Height: 5' (1.524 m)   Body mass index is 27.54 kg/m.     04/16/2023    8:29 AM 10/14/2022   11:02 AM 06/09/2022    8:42 AM 04/24/2022    8:38 AM 03/18/2022    9:18 AM 07/09/2021    9:05 AM 05/21/2021    8:27 AM  Advanced Directives  Does Patient Have a Medical Advance Directive? No No No No No No No  Would patient like information on creating a medical advance directive? No - Patient declined    Yes (MAU/Ambulatory/Procedural Areas - Information given) No - Patient declined No - Patient declined    Current Medications (verified) Outpatient Encounter Medications as of 04/16/2023  Medication Sig   acetaminophen (TYLENOL) 500 MG tablet Take 1,000 mg by mouth every 6 (six) hours as needed for mild pain or headache. Take 1,000  mg by mouth every six to eight hours as needed for pain or headaches   amLODipine (NORVASC) 10 MG tablet Take 1 tablet (10 mg total) by mouth at bedtime.   aspirin EC 81 MG EC tablet Take 1 tablet (81 mg total) by mouth daily. (Patient taking differently: Take 81 mg by mouth in the morning.)   atorvastatin (LIPITOR) 10 MG tablet Take 1 tablet by mouth once daily   Cholecalciferol (VITAMIN D3 PO) Take 2,000 Int'l Units/day by mouth.   Cyanocobalamin (B-12) 1000 MCG CAPS Take 1,000 mcg by mouth daily.   fexofenadine (ALLEGRA) 180 MG tablet Take 180 mg by mouth daily.   fluticasone (FLONASE) 50 MCG/ACT nasal spray Place 2 sprays into both nostrils daily.   lactulose (CHRONULAC) 10 GM/15ML solution Take 15 mLs (10 g total) by mouth daily.   nortriptyline (PAMELOR) 25 MG capsule Take 1 capsule by mouth at bedtime   Omega-3 Fatty Acids (FISH OIL PO) Take 1 capsule by mouth in the morning.    No facility-administered encounter medications on file as of 04/16/2023.    Allergies (verified) Patient has no known allergies.   History: Past Medical History:  Diagnosis Date   Anginal pain    Arthritis    Cataract    repaired.   Chronic kidney disease    rt kidney horseshoe   Diverticulitis    Drug abuse    Hyperlipemia    Hypertension    Mallory-Weiss tear  Muscle spasms of both lower extremities    Rectal bleed    Retinal detachment    R eye, per patient   Shortness of breath    Past Surgical History:  Procedure Laterality Date   APPENDECTOMY     COLONOSCOPY N/A 04/11/2015   Procedure: COLONOSCOPY;  Surgeon: West Bali, MD;  Location: [facility];  Service: Endoscopy;  Laterality: N/A;  8:30 AM   COLONOSCOPY  2013   DIAGNOSTIC LAPAROSCOPY     for horseshoe kidney   ESOPHAGOGASTRODUODENOSCOPY  12/17/2012   Procedure: ESOPHAGOGASTRODUODENOSCOPY (EGD);  Surgeon: Graylin Shiver, MD;  Location: [facility] ENDOSCOPY;  Service: Endoscopy;  Laterality: N/A;   Lipoma removal     in 1990s had  an egg-sized tumor removed from her leg, she thinks it was a lipoma   repair of detached retina     TUBAL LIGATION     UPPER GASTROINTESTINAL ENDOSCOPY     Family History  Problem Relation Age of Onset   Hypertension Mother    Asthma Mother    Coronary artery disease Father    Heart attack Father    Asthma Father    Diabetes Sister    Drug abuse Sister    Stroke Sister    Drug abuse Sister    Heart disease Sister    Coronary artery disease Maternal Uncle    Stroke Maternal Grandmother    Coronary artery disease Maternal Grandmother    Diabetes Paternal Grandmother    Kidney disease Paternal Grandmother    Colon cancer Neg Hx    Breast cancer Neg Hx    Colon polyps Neg Hx    Esophageal cancer Neg Hx    Stomach cancer Neg Hx    Rectal cancer Neg Hx    Social History   Socioeconomic History   Marital status: Legally Separated    Spouse name: Not on file   Number of children: 1   Years of education: 12   Highest education level: High school graduate  Occupational History   Occupation: Retired    Comment: Cook at [employer]   Tobacco Use   Smoking status: Never    Passive exposure: Never   Smokeless tobacco: Never  Vaping Use   Vaping Use: Never used  Substance and Sexual Activity   Alcohol use: Yes    Alcohol/week: 0.0 standard drinks of alcohol    Comment: occassional drink at holidays   Drug use: Yes    Frequency: 3.0 times per week    Types: Marijuana, Cocaine, "Crack" cocaine    Comment: cocaine and THC + in UDS in November 2013. Last used 1 year ago as of 04/11/15   Sexual activity: Not Currently  Other Topics Concern   Not on file  Social History Narrative   Patient lives alone in [place].    Patient is legally separated.    Has one daughter and they are very close. "[employer]" for grandchildren.    Patient is retired and enjoying it.   Walks one mile everyday for exercise.    Patient is active with her church and has a small friend group she spends time  with.      -Has history of smoking crack cocaine and is trying hard to stop. It's been hard because she takes the bus. When she feels the urge to smoke she prays, reads the Bible, or calls her sister for support. She does well if she stays active, but the times when she most wants to  smoke crack is if she is bored at home. *Patient denies any recent use.*      -Also uses marijuana every few months. *Denies any recent use.*      Social Determinants of Health   Financial Resource Strain: Low Risk  (04/16/2023)   Overall Financial Resource Strain (CARDIA)    Difficulty of Paying Living Expenses: Not hard at all  Food Insecurity: No Food Insecurity (04/16/2023)   Hunger Vital Sign    Worried About Running Out of Food in the Last Year: Never true    Ran Out of Food in the Last Year: Never true  Transportation Needs: No Transportation Needs (04/16/2023)   PRAPARE - Administrator, Civil Service (Medical): No    Lack of Transportation (Non-Medical): No  Physical Activity: Sufficiently Active (04/16/2023)   Exercise Vital Sign    Days of Exercise per Week: 3 days    Minutes of Exercise per Session: 60 min  Stress: No Stress Concern Present (04/16/2023)   [employer] of Occupational Health - Occupational Stress Questionnaire    Feeling of Stress : Not at all  Social Connections: Moderately Integrated (04/16/2023)   Social Connection and Isolation Panel [NHANES]    Frequency of Communication with Friends and Family: More than three times a week    Frequency of Social Gatherings with Friends and Family: More than three times a week    Attends Religious Services: More than 4 times per year    Active Member of [employer] or Organizations: Yes    Attends Engineer, structural: More than 4 times per year    Marital Status: Divorced    Tobacco Counseling Counseling given: Not Answered   Clinical Intake:  Pre-visit preparation completed: No  Pain : No/denies pain     BMI  - recorded: 27.54 Nutritional Status: BMI 25 -29 Overweight Nutritional Risks: None Diabetes: No  How often do you need to have someone help you when you read instructions, pamphlets, or other written materials from your doctor or pharmacy?: 1 - Never  Diabetic? No  Interpreter Needed?: No  Information entered by :: Theresa Mulligan LPN   Activities of Daily Living    04/16/2023    8:27 AM  In your present state of health, do you have any difficulty performing the following activities:  Hearing? 0  Vision? 0  Difficulty concentrating or making decisions? 0  Walking or climbing stairs? 0  Dressing or bathing? 0  Doing errands, shopping? 0  Preparing Food and eating ? N  Using the Toilet? N  In the past six months, have you accidently leaked urine? N  Do you have problems with loss of bowel control? N  Managing your Medications? N  Managing your Finances? N  Housekeeping or managing your Housekeeping? N    Patient Care Team: Latrelle Dodrill, MD as PCP - General (Family Medicine) Corky Crafts, MD as PCP - Cardiology (Cardiology) Arita Miss, MD as Attending Physician (Nephrology) Golda Acre, OD (Optometry)  Indicate any recent Medical Services you may have received from other than [facility] providers in the past year (date may be approximate).     Assessment:   This is a routine wellness examination for Lielle.  Hearing/Vision screen Hearing Screening - Comments:: Denies hearing difficulties   Vision Screening - Comments::  - up to date with routine eye exams with  Dr Dione Booze  Dietary issues and exercise activities discussed:     Goals Addressed  This Visit's Progress     Lose weight (pt-stated)        Stay active.       Depression Screen    04/16/2023    8:27 AM 10/14/2022   11:01 AM 07/14/2022    8:40 AM 06/09/2022    8:42 AM 03/18/2022    9:15 AM 11/19/2021    8:51 AM 08/28/2021    5:08 PM  PHQ 2/9 Scores  PHQ - 2 Score  0 0 0 0 1 1 0  PHQ- 9 Score  0 2 4 4 6  0    Fall Risk    04/16/2023    8:28 AM 03/18/2022    9:18 AM 07/09/2021    8:44 AM 05/21/2021    8:26 AM 10/04/2020   10:47 AM  Fall Risk   Falls in the past year? 0 0 0 0 0  Number falls in past yr: 0 0 0 0 0  Injury with Fall? 0 0 0 0 0  Risk for fall due to : No Fall Risks No Fall Risks     Follow up Falls prevention discussed Falls prevention discussed       FALL RISK PREVENTION PERTAINING TO THE HOME:  Any stairs in or around the home? Yes  If so, are there any without handrails? No  Home free of loose throw rugs in walkways, pet beds, electrical cords, etc? Yes  Adequate lighting in your home to reduce risk of falls? Yes   ASSISTIVE DEVICES UTILIZED TO PREVENT FALLS:  Life alert? No  Use of a cane, walker or w/c? No  Grab bars in the bathroom? No  Shower chair or bench in shower? No  Elevated toilet seat or a handicapped toilet? No   TIMED UP AND GO:  Was the test performed? No . Audio Visit  Cognitive Function:        04/16/2023    8:29 AM 03/18/2022    9:20 AM 08/08/2019    9:48 AM  6CIT Screen  What Year? 0 points 0 points 0 points  What month? 0 points 0 points 0 points  What time? 0 points 0 points 0 points  Count back from 20 0 points 0 points 0 points  Months in reverse 0 points 0 points 2 points  Repeat phrase 0 points 0 points 0 points  Total Score 0 points 0 points 2 points    Immunizations Immunization History  Administered Date(s) Administered   Fluad Quad(high Dose 65+) 10/04/2020   Influenza,inj,Quad PF,6+ Mos 10/28/2019   Influenza-Unspecified 10/24/2021, 09/27/2022   Moderna Sars-Covid-2 Vaccination 02/10/2020, 03/09/2020, 11/07/2020, 05/28/2021, 09/27/2022   PNEUMOCOCCAL CONJUGATE-20 04/23/2021   PPD Test 09/23/2013, 09/27/2014   Pfizer Covid-19 Vaccine Bivalent Booster 27yrs & up 10/24/2021   Pneumococcal Polysaccharide-23 10/28/2019   Tdap 04/01/2013, 01/07/2022   Zoster Recombinat (Shingrix)  07/09/2021, 01/07/2022    TDAP status: Up to date  Flu Vaccine status: Up to date  Pneumococcal vaccine status: Up to date  Covid-19 vaccine status: Completed vaccines  Qualifies for Shingles Vaccine? Yes   Zostavax completed Yes   Shingrix Completed?: Yes  Screening Tests Health Maintenance  Topic Date Due   COVID-19 Vaccine (7 - 2023-24 season) 05/02/2023 (Originally 11/22/2022)   INFLUENZA VACCINE  07/30/2023   Medicare Annual Wellness (AWV)  04/15/2024   MAMMOGRAM  04/08/2025   COLONOSCOPY (Pts 45-54yrs Insurance coverage will need to be confirmed)  07/11/2025   DTaP/Tdap/Td (3 - Td or Tdap) 01/08/2032   Pneumonia Vaccine 65+  Years old  Completed   DEXA SCAN  Completed   Hepatitis C Screening  Completed   Zoster Vaccines- Shingrix  Completed   HPV VACCINES  Aged Out    Health Maintenance  There are no preventive care reminders to display for this patient.   Colorectal cancer screening: Type of screening: Colonoscopy. Completed 07/11/20. Repeat every 5 years  Mammogram status: Completed 04/09/23. Repeat every year  Bone Density status: Completed 12/15/19. Results reflect: Bone density results: OSTEOPOROSIS. Repeat every   years.  Lung Cancer Screening: (Low Dose CT Chest recommended if Age 71-80 years, 30 pack-year currently smoking OR have quit w/in 15years.) does not qualify.     Additional Screening:  Hepatitis C Screening: does qualify; Completed 04/07/16  Vision Screening: Recommended annual ophthalmology exams for early detection of glaucoma and other disorders of the eye. Is the patient up to date with their annual eye exam?  Yes  Who is the provider or what is the name of the office in which the patient attends annual eye exams? Dr Dione Booze If pt is not established with a provider, would they like to be referred to a provider to establish care? No .   Dental Screening: Recommended annual dental exams for proper oral hygiene  Community Resource Referral /  Chronic Care Management:  CRR required this visit?  No   CCM required this visit?  No      Plan:     I have personally reviewed and noted the following in the patient's chart:   Medical and social history Use of alcohol, tobacco or illicit drugs  Current medications and supplements including opioid prescriptions. Patient is not currently taking opioid prescriptions. Functional ability and status Nutritional status Physical activity Advanced directives List of other physicians Hospitalizations, surgeries, and ER visits in previous 12 months Vitals Screenings to include cognitive, depression, and falls Referrals and appointments  In addition, I have reviewed and discussed with patient certain preventive protocols, quality metrics, and best practice recommendations. A written personalized care plan for preventive services as well as general preventive health recommendations were provided to patient.     Tillie Rung, LPN   04/06/8118   Nurse Notes: None      I have reviewed this visit and agree with the documentation.  Marshall L Chambliss

## 2023-04-21 ENCOUNTER — Ambulatory Visit (INDEPENDENT_AMBULATORY_CARE_PROVIDER_SITE_OTHER): Payer: 59

## 2023-04-21 ENCOUNTER — Ambulatory Visit (HOSPITAL_COMMUNITY)
Admission: EM | Admit: 2023-04-21 | Discharge: 2023-04-21 | Disposition: A | Discharge status: Home / Self Care | Payer: 59 | Attending: Internal Medicine | Admitting: Internal Medicine

## 2023-04-21 ENCOUNTER — Encounter (HOSPITAL_COMMUNITY): Payer: Self-pay

## 2023-04-21 DIAGNOSIS — M549 Dorsalgia, unspecified: Secondary | ICD-10-CM

## 2023-04-21 DIAGNOSIS — J441 Chronic obstructive pulmonary disease with (acute) exacerbation: Secondary | ICD-10-CM

## 2023-04-21 DIAGNOSIS — R062 Wheezing: Secondary | ICD-10-CM | POA: Diagnosis not present

## 2023-04-21 DIAGNOSIS — R079 Chest pain, unspecified: Secondary | ICD-10-CM | POA: Diagnosis not present

## 2023-04-21 MED ORDER — ALBUTEROL SULFATE HFA 108 (90 BASE) MCG/ACT IN AERS: 1.0000 | INHALATION_SPRAY | Freq: Four times a day (QID) | RESPIRATORY_TRACT | 0 refills | Status: DC | PRN

## 2023-04-21 MED ORDER — DEXAMETHASONE 10 MG/ML FOR PEDIATRIC ORAL USE
INTRAMUSCULAR | Status: AC
  Filled 2023-04-21: qty 1

## 2023-04-21 MED ORDER — DEXAMETHASONE SODIUM PHOSPHATE 10 MG/ML IJ SOLN
10.0000 mg | Freq: Once | INTRAMUSCULAR | Status: AC
  Administered 2023-04-21: 10 mg via INTRAMUSCULAR

## 2023-04-21 NOTE — ED Provider Notes (Signed)
[facility]    CSN: [id number] Arrival date & time: 04/21/23  7829      History   Chief Complaint Chief Complaint  Patient presents with  . Back Pain    HPI Jennifer Moss is a 72 y.o. female.   Patient with history of CKD stage 3 presents to urgent care for evaluation of left-sided upper back pain and intermittent wheezing at nighttime for the last 3 weeks. Upper back pain radiates through the body to the front of the chest and is described as a tightness that is worse with movement and palpation. She denies cough, nasal congestion, fever/chills, shortness of breath, heart palpitations, dizziness, rash, N/V/D, and recent trauma/injuries to the chest/upper back. No flank pain or urinary symptoms.    Back Pain   Past Medical History:  Diagnosis Date  . Anginal pain   . Arthritis   . Cataract    repaired.  . Chronic kidney disease    rt kidney horseshoe  . Diverticulitis   . Drug abuse   . Hyperlipemia   . Hypertension   . Mallory-Weiss tear   . Muscle spasms of both lower extremities   . Rectal bleed   . Retinal detachment    R eye, per patient  . Shortness of breath     Patient Active Problem List   Diagnosis Date Noted  . Vitamin D deficiency 08/20/2021  . Polyneuropathy 05/21/2021  . Insomnia 10/04/2020  . Overweight 10/28/2019  . Headache 07/12/2018  . Hirsutism 08/28/2017  . Gingival enlargement 04/07/2016  . Rib pain 02/24/2015  . BRBPR (bright red blood per rectum) 04/26/2014  . Diverticulosis 04/26/2014  . Hyperlipidemia 11/28/2013  . Asthma 04/03/2013  . 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 Constipation 01/28/2013  . Injury of left shoulder 01/19/2013  . Chest pain 01/12/2013  . Mass of soft tissue 01/12/2013  . Prolonged Q-T interval on ECG 01/11/2013  . Leg pain 01/11/2013  . Mallory-Weiss tear 12/23/2012  . Rectal bleeding 12/23/2012  . Hypertension 12/23/2012  . Drug abuse (including cocaine) 12/23/2012  . 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 Chronic kidney disease, stage 3 (HCC) 12/23/2012    Past Surgical History:  Procedure Laterality Date  . APPENDECTOMY    . COLONOSCOPY N/A 04/11/2015   Procedure: COLONOSCOPY;  Surgeon: West Bali, MD;  Location: [facility];  Service: Endoscopy;  Laterality: N/A;  8:30 AM  . COLONOSCOPY  2013  . DIAGNOSTIC LAPAROSCOPY     for horseshoe kidney  . ESOPHAGOGASTRODUODENOSCOPY  12/17/2012   Procedure: ESOPHAGOGASTRODUODENOSCOPY (EGD);  Surgeon: Graylin Shiver, MD;  Location: [facility];  Service: Endoscopy;  Laterality: N/A;  . Lipoma removal     in 1990s had an egg-sized tumor removed from her leg, she thinks it was a lipoma  . repair of detached retina    . TUBAL LIGATION    . UPPER GASTROINTESTINAL ENDOSCOPY      OB History     Gravida  1   Para  1   Term      Preterm      AB      Living  1      SAB      IAB      Ectopic      Multiple      Live Births  1            Home Medications    Prior to Admission medications   Medication Sig Start  Date End Date Taking? Authorizing Provider  albuterol (VENTOLIN HFA) 108 (90 Base) MCG/ACT inhaler Inhale 1-2 puffs into the lungs every 6 (six) hours as needed for wheezing or shortness of breath. 04/21/23  Yes Carlisle Beers, FNP  acetaminophen (TYLENOL) 500 MG tablet Take 1,000 mg by mouth every 6 (six) hours as needed for mild pain or headache. Take 1,000 mg by mouth every six to eight hours as needed for pain or headaches    [provider]  amLODipine (NORVASC) 10 MG tablet Take 1 tablet (10 mg total) by mouth at bedtime. 06/09/22   Latrelle Dodrill, MD  aspirin EC 81 MG EC tablet Take 1 tablet (81 mg total) by mouth daily. Patient taking differently: Take 81 mg by mouth in the morning. 01/19/13   Garnetta Buddy, MD  atorvastatin (LIPITOR) 10 MG tablet Take 1 tablet by mouth once daily 12/17/22   Latrelle Dodrill, MD  Cholecalciferol (VITAMIN D3 PO) Take 2,000  Int'l Units/day by mouth.    [provider]  Cyanocobalamin (B-12) 1000 MCG CAPS Take 1,000 mcg by mouth daily. 05/21/21   Latrelle Dodrill, MD  fexofenadine (ALLEGRA) 180 MG tablet Take 180 mg by mouth daily.    [provider]  fluticasone (FLONASE) 50 MCG/ACT nasal spray Place 2 sprays into both nostrils daily. 04/24/22   Latrelle Dodrill, MD  lactulose (CHRONULAC) 10 GM/15ML solution Take 15 mLs (10 g total) by mouth daily. 10/14/22   Latrelle Dodrill, MD  nortriptyline (PAMELOR) 25 MG capsule Take 1 capsule by mouth at bedtime 10/24/22   Latrelle Dodrill, MD  Omega-3 Fatty Acids (FISH OIL PO) Take 1 capsule by mouth in the morning.     [provider]    Family History Family History  Problem Relation Age of Onset  . Hypertension Mother   . Asthma Mother   . Coronary artery disease Father   . Heart attack Father   . Asthma Father   . Diabetes Sister   . Drug abuse Sister   . Stroke Sister   . Drug abuse Sister   . Heart disease Sister   . Coronary artery disease Maternal Uncle   . Stroke Maternal Grandmother   . Coronary artery disease Maternal Grandmother   . Diabetes Paternal Grandmother   . Kidney disease Paternal Grandmother   . Colon cancer Neg Hx   . Breast cancer Neg Hx   . Colon polyps Neg Hx   . Esophageal cancer Neg Hx   . Stomach cancer Neg Hx   . Rectal cancer Neg Hx     Social History Social History   Tobacco Use  . Smoking status: Never    Passive exposure: Never  . Smokeless tobacco: Never  Vaping Use  . Vaping Use: Never used  Substance Use Topics  . Alcohol use: Yes    Alcohol/week: 0.0 standard drinks of alcohol    Comment: occassional drink at holidays  . Drug use: Yes    Frequency: 3.0 times per week    Types: Marijuana, Cocaine, "Crack" cocaine    Comment: cocaine and THC + in UDS in November 2013. Last used 1 year ago as of 04/11/15     Allergies   Patient has no known allergies.   Review of  Systems Review of Systems  Musculoskeletal:  Positive for back pain.     Physical Exam Triage Vital Signs ED Triage Vitals  Enc Vitals Group     BP  04/21/23 0824 (!) 135/93     Pulse Rate 04/21/23 0824 78     Resp 04/21/23 0824 16     Temp 04/21/23 0824 98 F (36.7 C)     Temp Source 04/21/23 0824 Oral     SpO2 04/21/23 0824 96 %     Weight --      Height --      Head Circumference --      Peak Flow --      Pain Score 04/21/23 0826 9     Pain Loc --      Pain Edu? --      Excl. in GC? --    No data found.  Updated Vital Signs BP (!) 135/93 (BP Location: Right Arm)   Pulse 78   Temp 98 F (36.7 C) (Oral)   Resp 16   SpO2 96%   Visual Acuity Right Eye Distance:   Left Eye Distance:   Bilateral Distance:    Right Eye Near:   Left Eye Near:    Bilateral Near:     Physical Exam   UC Treatments / Results  Labs (all labs ordered are listed, but only abnormal results are displayed) Labs Reviewed - No data to display  EKG   Radiology DG Chest 2 View  Result Date: 04/21/2023 CLINICAL DATA:  Chest pain, wheezing EXAM: CHEST - 2 VIEW COMPARISON:  11/07/2021 FINDINGS: Cardiac size is within normal limits. Thoracic aorta is ectatic and tortuous. Lung fields are clear of any infiltrates or pulmonary edema. Low position of diaphragms may suggest COPD or air trapping. There is no pleural effusion or pneumothorax. IMPRESSION: There are no signs of pulmonary edema or focal pulmonary consolidation. Low position of diaphragms may suggest COPD or air trapping. Electronically Signed   By: Ernie Avena M.D.   On: 04/21/2023 09:17    Procedures Procedures (including critical care time)  Medications Ordered in UC Medications  dexamethasone (DECADRON) injection 10 mg (10 mg Intramuscular Given 04/21/23 0933)    Initial Impression / Assessment and Plan / UC Course  I have reviewed the triage vital signs and the nursing notes.  Pertinent labs & imaging results that  were available during my care of the patient were reviewed by me and considered in my medical decision making (see chart for details).     *** Final Clinical Impressions(s) / UC Diagnoses   Final diagnoses:  Upper back pain     Discharge Instructions      Your chest x-ray shows findings consistent with inflammation of your chest that is likely due to chronic obstructive pulmonary disease (COPD).  This is what is likely causing your wheezing in your chest and your pain.  I gave you a shot of steroid in the clinic which will help with the inflammation over the next 2-3 days.  You may use the albuterol inhaler 1-2 puffs every 6 hours as needed for wheezing and chest tightness.   If you develop any new or worsening symptoms or do not improve in the next 2 to 3 days, please return.  If your symptoms are severe, please go to the emergency room.  Follow-up with your primary care provider for further evaluation and management of your symptoms as well as ongoing wellness visits.  I hope you feel better!   ED Prescriptions     Medication Sig Dispense Auth. Provider   albuterol (VENTOLIN HFA) 108 (90 Base) MCG/ACT inhaler Inhale 1-2 puffs into the lungs every 6 (six)  hours as needed for wheezing or shortness of breath. 8 g Carlisle Beers, FNP      PDMP not reviewed this encounter.

## 2023-04-21 NOTE — ED Triage Notes (Signed)
Pt states mid back pain that radiates to her chest and wheezing on and off for the past 3 days.

## 2023-04-21 NOTE — Discharge Instructions (Addendum)
Your chest x-ray shows findings consistent with inflammation of your chest that is likely due to chronic obstructive pulmonary disease (COPD).  This is what is likely causing your wheezing in your chest and your pain.  I gave you a shot of steroid in the clinic which will help with the inflammation over the next 2-3 days.  You may use the albuterol inhaler 1-2 puffs every 6 hours as needed for wheezing and chest tightness.   If you develop any new or worsening symptoms or do not improve in the next 2 to 3 days, please return.  If your symptoms are severe, please go to the emergency room.  Follow-up with your primary care provider for further evaluation and management of your symptoms as well as ongoing wellness visits.  I hope you feel better!

## 2023-04-30 ENCOUNTER — Ambulatory Visit (INDEPENDENT_AMBULATORY_CARE_PROVIDER_SITE_OTHER): Payer: 59 | Admitting: Family Medicine

## 2023-04-30 ENCOUNTER — Other Ambulatory Visit: Payer: Self-pay

## 2023-04-30 ENCOUNTER — Encounter: Payer: Self-pay | Admitting: Family Medicine

## 2023-04-30 VITALS — BP 125/88 | HR 81 | Ht 60.0 in | Wt 146.0 lb

## 2023-04-30 DIAGNOSIS — R519 Headache, unspecified: Secondary | ICD-10-CM

## 2023-04-30 DIAGNOSIS — K5900 Constipation, unspecified: Secondary | ICD-10-CM

## 2023-04-30 DIAGNOSIS — J309 Allergic rhinitis, unspecified: Secondary | ICD-10-CM | POA: Diagnosis not present

## 2023-04-30 DIAGNOSIS — N184 Chronic kidney disease, stage 4 (severe): Secondary | ICD-10-CM

## 2023-04-30 DIAGNOSIS — I10 Essential (primary) hypertension: Secondary | ICD-10-CM

## 2023-04-30 MED ORDER — ALBUTEROL SULFATE HFA 108 (90 BASE) MCG/ACT IN AERS: 1.0000 | INHALATION_SPRAY | Freq: Four times a day (QID) | RESPIRATORY_TRACT | 0 refills | Status: AC | PRN

## 2023-04-30 MED ORDER — ATORVASTATIN CALCIUM 10 MG PO TABS: 10.0000 mg | ORAL_TABLET | Freq: Every day | ORAL | 3 refills | Status: DC

## 2023-04-30 MED ORDER — FLUTICASONE PROPIONATE 50 MCG/ACT NA SUSP: 2.0000 | Freq: Every day | NASAL | 6 refills | Status: AC

## 2023-04-30 MED ORDER — AMLODIPINE BESYLATE 10 MG PO TABS
10.0000 mg | ORAL_TABLET | Freq: Every day | ORAL | 3 refills | Status: DC
Start: 2023-04-30 — End: 2024-05-16

## 2023-04-30 MED ORDER — LACTULOSE 10 GM/15ML PO SOLN: 10.0000 g | Freq: Every day | ORAL | 11 refills | Status: DC

## 2023-04-30 NOTE — Patient Instructions (Signed)
It was great to see you again today.  Stay on current medications Follow up with Dr. Raymondo Band for lung function testing as scheduled Follow up with me in 6 months, sooner if needed Refilled albuterol  Be well, Dr. Pollie Meyer

## 2023-04-30 NOTE — Progress Notes (Signed)
  Date of Visit: 04/30/2023   SUBJECTIVE:   HPI:  Jennifer Moss presents today for routine follow-up.  Hypertension: Currently taking amlodipine 10 mg daily.  Tolerating this well.  Constipation: Currently taking lactulose 10 g daily.  Has good results with this medication.  Headaches: She stopped the nortriptyline as she felt it was making her gain weight.  Has not had any recurrent headaches since that time.  Has begun exercising and has lost some weight which she is proud of.  Allergies: Currently uses Flonase 2 sprays daily and Allegra 180 mg daily.  Doing well with this.  Possible COPD: Was seen at urgent care recently for bronchitis, wheeze.  Given shot of Decadron.  Chest x-ray suggested possible hyperexpansion.  Patient reports history of occasional tobacco use in the past, also used to smoke crack cocaine on occasion.  Never smoked daily.  Has not had PFTs.  Was given a prescription for albuterol inhaler, which she has been using.  Overall symptoms improved.  She does request a refill of albuterol today.  CKD: Last GFR was 25 when checked by nephrology in January.  She is due to follow-up with them in August.  OBJECTIVE:   BP 125/88   Pulse 81   Ht 5' (1.524 m)   Wt 146 lb (66.2 kg)   SpO2 100%   BMI 28.51 kg/m  Gen: No acute distress, pleasant, cooperative HEENT: Normocephalic, atraumatic Heart: Regular rate and rhythm, no murmur Lungs: Clear to auscultation bilaterally, normal effort Neuro: Alert, grossly nonfocal, speech normal Ext: No no edema bilateral lower extremities  ASSESSMENT/PLAN:   Health maintenance:  -Current on health maintenance items  Hypertension Well-controlled, continue amlodipine.  Headache Doing well off of nortriptyline.  Advised to let me know if she has recurrence of headaches.  Constipation Well-controlled with lactulose, continue.  CKD (chronic kidney disease) stage 4, GFR 15-29 ml/min (HCC) Persistently now in the CKD 4 range.   Continuing to follow-up with nephrology regularly.  Allergic rhinitis Doing well with Flonase and Allegra.  Refilled albuterol after recent bout of bronchitis.  I am going to have her schedule with Dr. Raymondo Band for PFTs in about a month to assess for COPD given chest x-ray with findings of hyperexpansion.  FOLLOW UP: Follow up in 6 months with me for chronic medical issues See Dr. Raymondo Band in 1 month for PFTs.  Jennifer J. Pollie Meyer, MD [facility]

## 2023-04-30 NOTE — Assessment & Plan Note (Signed)
Well-controlled with lactulose, continue.

## 2023-04-30 NOTE — Assessment & Plan Note (Signed)
Persistently now in the CKD 4 range.  Continuing to follow-up with nephrology regularly.

## 2023-04-30 NOTE — Assessment & Plan Note (Signed)
Doing well with Flonase and Allegra.  Refilled albuterol after recent bout of bronchitis.  I am going to have her schedule with Dr. Raymondo Band for PFTs in about a month to assess for COPD given chest x-ray with findings of hyperexpansion.

## 2023-04-30 NOTE — Assessment & Plan Note (Signed)
Well controlled, continue amlodipine.

## 2023-04-30 NOTE — Assessment & Plan Note (Signed)
Doing well off of nortriptyline.  Advised to let me know if she has recurrence of headaches.

## 2023-05-21 ENCOUNTER — Ambulatory Visit (INDEPENDENT_AMBULATORY_CARE_PROVIDER_SITE_OTHER): Payer: 59 | Admitting: Pharmacist

## 2023-05-21 ENCOUNTER — Encounter: Payer: Self-pay | Admitting: Pharmacist

## 2023-05-21 VITALS — BP 107/82 | HR 79 | Ht 61.0 in | Wt 146.6 lb

## 2023-05-21 DIAGNOSIS — J45909 Unspecified asthma, uncomplicated: Secondary | ICD-10-CM | POA: Diagnosis not present

## 2023-05-21 DIAGNOSIS — J441 Chronic obstructive pulmonary disease with (acute) exacerbation: Secondary | ICD-10-CM

## 2023-05-21 MED ORDER — ANORO ELLIPTA 62.5-25 MCG/ACT IN AEPB
1.0000 | INHALATION_SPRAY | Freq: Every day | RESPIRATORY_TRACT | 11 refills | Status: DC
Start: 2023-05-21 — End: 2023-11-17

## 2023-05-21 NOTE — Progress Notes (Signed)
Reviewed and agree with Dr Koval's plan.   

## 2023-05-21 NOTE — Progress Notes (Signed)
   S:    Chief Complaint  Patient presents with   Medication Management    PFT   Jennifer Moss is a 72 y.o. female who presents for lung function evaluation.  PMH is significant for allergic rhinitis, asthma, HTN, HLD, CKD, and previous drug use.  Patient was referred and last seen by Primary Care Provider, Dr. Pollie Meyer, on 04/30/2023.   At last visit, patient was recently seen at urgent care for bronchitis, wheeze. Given shot of Dexamethasone (Decadron). Chest x-ray suggested possible hyperexpansion. Patient was referred to pharmacy clinic for PFT.    Patient reports breathing has been better, reports breathing 10/10. Denies history of asthma or COPD.   Medication adherence reported. Patient reports last dose of albuterol inhaler was this morning around 7 AM (< 3 hours prior to visit). Current COPD medications: albuterol only  Rescue inhaler use frequency: 2x/day Patient exacerbation hx: most recent 04/21/2023  Denies tobacco use in any form.  Stopped smoking crack cocaine in 2014. Smoked about twice a week for ~15 years.  Smoked marijuana daily for many years> 20 years and quit ~20 years ago.   O: Review of Systems  All other systems reviewed and are negative.   Physical Exam Constitutional:      Appearance: Normal appearance.  Pulmonary:     Effort: Pulmonary effort is normal.  Neurological:     Mental Status: She is alert.  Psychiatric:        Mood and Affect: Mood normal.        Behavior: Behavior normal.        Thought Content: Thought content normal.    Vitals:   05/21/23 0900  BP: 107/82  Pulse: 79  SpO2: 97%    CAT score= 0 See Documentation Flowsheet - CAT/COPD for complete symptom scoring.  See "scanned report" or Documentation Flowsheet (discrete results - PFTs) for Spirometry results. Patient provided good effort while attempting spirometry.   Lung Age = 49 Albuterol Neb  Lot# [id number]     Exp. 01/2024   A/P: Patient with a remote history of asthma  without use of any inhalers for many years.  Recently experiencing wheezing and taking albuterol ~2x/day following an episode of bronchitis.  This was her firs exacerbation of any breathing symptoms in a long period of time. At the time of her exacerbation work-up, hyperexpansion on her chest x-ray was noted, leading to spirometry evaluation which reveals mild obstruction.  Post nebulized albuterol tx revealed mild obstruction with no significant pre-post change, indicative of non-reversible obstructive lung disease. Spirometry [facility] A based on CAT score and exacerbations in the last year. Patient with good inhaler technique. Patient reports medication adherence to albuterol.  -Start Anoro Ellipta 1 inhalation once daily. Educated patient on purpose, proper use, potential adverse effects. -Continue albuterol inhaler PRN severe shortness of breath.  -Reviewed results of pulmonary function tests. Patient verbalized understanding of results and education.    Written patient instructions provided.   Total time in face to face counseling 30 minutes.    Follow-up:  Pharmacist PRN. PCP clinic visit October 2024.  Patient seen with Bing Plume, PharmD Candidate and Valeda Malm, PharmD, PGY2 Pharmacy Resident.

## 2023-05-21 NOTE — Patient Instructions (Addendum)
It was nice seeing you today!  Your pulmonary function test today showed mild obstruction. We are starting you on a new inhaler Anoro Ellipta.  Please take this inhaler 1 puff once daily.   Continue to use albuterol as needed for any symptoms.

## 2023-05-22 ENCOUNTER — Telehealth: Payer: Self-pay | Admitting: Family Medicine

## 2023-05-22 NOTE — Telephone Encounter (Signed)
Patient dropped off form at front desk for Job/Medical Statement.  Verified that patient section of form has been completed.  Last DOS/WCC with PCP was 04/30/23.  Placed form in red team folder to be completed by clinical staff.  Vilinda Blanks

## 2023-05-22 NOTE — Telephone Encounter (Signed)
Form has been placed In your box please complete when you have the chance. Thank you. Penni Bombard CMA

## 2023-05-26 NOTE — Telephone Encounter (Signed)
Form is located in Dr. Berneta Levins box in the front office. Penni Bombard CMA

## 2023-05-28 NOTE — Telephone Encounter (Signed)
Form completed, gave patient original copy this afternoon when she walked in for it. Placed copy in scan pile  Latrelle Dodrill, MD

## 2023-07-06 ENCOUNTER — Ambulatory Visit (HOSPITAL_COMMUNITY)
Admission: EM | Admit: 2023-07-06 | Discharge: 2023-07-06 | Disposition: A | Discharge status: Home / Self Care | Payer: 59 | Attending: Family Medicine | Admitting: Family Medicine

## 2023-07-06 ENCOUNTER — Ambulatory Visit (HOSPITAL_COMMUNITY): Payer: 59

## 2023-07-06 ENCOUNTER — Encounter (HOSPITAL_COMMUNITY): Payer: Self-pay

## 2023-07-06 DIAGNOSIS — R109 Unspecified abdominal pain: Secondary | ICD-10-CM

## 2023-07-06 DIAGNOSIS — R079 Chest pain, unspecified: Secondary | ICD-10-CM

## 2023-07-06 MED ORDER — METHOCARBAMOL 500 MG PO TABS: 500.0000 mg | ORAL_TABLET | Freq: Two times a day (BID) | ORAL | 0 refills | Status: DC | PRN

## 2023-07-06 MED ORDER — PREDNISONE 20 MG PO TABS: 40.0000 mg | ORAL_TABLET | Freq: Every day | ORAL | 0 refills | Status: AC

## 2023-07-06 NOTE — Discharge Instructions (Signed)
Your chest x-ray is clear and does not show any problem in the bones of the chest and there is no fluid or mass seen on this study  Take prednisone 20 mg--2 daily for 5 days  Methocarbamol 500 mg--1 every 12 hours as needed for muscle pain.  This medication can make you sleepy or dizzy  Please follow-up with your primary care about this issue

## 2023-07-06 NOTE — ED Provider Notes (Signed)
[facility]    CSN: [id number] Arrival date & time: 07/06/23  0801      History   Chief Complaint Chief Complaint  Patient presents with   Flank Pain    HPI Jennifer Moss is a 72 y.o. female.    Flank Pain   Here for bilateral thigh pain.  She states it began bothering her some intermittently in April.  At that time, she thought that it was due to her having done some abdominal crunches when exercising.  She has not been doing them but is continued to bother her off-and-on, and in the last week its become more persistent.  Its always been bilateral and it is not affected by eating, movement, or breathing.  She does noted to be tender.  No fever or congestion now and no rash  Past medical history consist of hypertension in CKD, with a reduced EGFR.  She also has a history of asthma or COPD  Past Medical History:  Diagnosis Date   Anginal pain (HCC)    Arthritis    Cataract    repaired.   Chronic kidney disease    rt kidney horseshoe   Diverticulitis    Drug abuse (HCC)    Hyperlipemia    Hypertension    Mallory-Weiss tear    Muscle spasms of both lower extremities    Rectal bleed    Retinal detachment    R eye, per patient   Shortness of breath     Patient Active Problem List   Diagnosis Date Noted   Allergic rhinitis 04/30/2023   Vitamin D deficiency 08/20/2021   Polyneuropathy 05/21/2021   Insomnia 10/04/2020   Overweight 10/28/2019   Headache 07/12/2018   Hirsutism 08/28/2017   Gingival enlargement 04/07/2016   Rib pain 02/24/2015   BRBPR (bright red blood per rectum) 04/26/2014   Diverticulosis 04/26/2014   Hyperlipidemia 11/28/2013   Asthma 04/03/2013   Neurofibromatosis, 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   Constipation 01/28/2013   Injury of left shoulder 01/19/2013   Chest pain 01/12/2013   Mass of soft tissue 01/12/2013   Prolonged Q-T interval on ECG 01/11/2013   Leg pain 01/11/2013   Mallory-Weiss tear 12/23/2012   Rectal bleeding  12/23/2012   Hypertension 12/23/2012   Drug abuse (including cocaine) 12/23/2012   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   CKD (chronic kidney disease) stage 4, GFR 15-29 ml/min (HCC) 12/23/2012    Past Surgical History:  Procedure Laterality Date   APPENDECTOMY     COLONOSCOPY N/A 04/11/2015   Procedure: COLONOSCOPY;  Surgeon: West Bali, MD;  Location[facility];  Service: Endoscopy;  Laterality: N/A;  8:30 AM   COLONOSCOPY  2013   DIAGNOSTIC LAPAROSCOPY     for horseshoe kidney   ESOPHAGOGASTRODUODENOSCOPY  12/17/2012   Procedure: ESOPHAGOGASTRODUODENOSCOPY (EGD);  Surgeon: Graylin Shiver, MD;  Location: [facility] ENDOSCOPY;  Service: Endoscopy;  Laterality: N/A;   Lipoma removal     in 1990s had an egg-sized tumor removed from her leg, she thinks it was a lipoma   repair of detached retina     TUBAL LIGATION     UPPER GASTROINTESTINAL ENDOSCOPY      OB History     Gravida  1   Para  1   Term      Preterm      AB      Living  1      SAB      IAB  Ectopic      Multiple      Live Births  1            Home Medications    Prior to Admission medications   Medication Sig Start Date End Date Taking? Authorizing Provider  acetaminophen (TYLENOL) 500 MG tablet Take 1,000 mg by mouth every 6 (six) hours as needed for mild pain or headache. Take 1,000 mg by mouth every six to eight hours as needed for pain or headaches   Yes [provider]  albuterol (VENTOLIN HFA) 108 (90 Base) MCG/ACT inhaler Inhale 1-2 puffs into the lungs every 6 (six) hours as needed for wheezing or shortness of breath. 04/30/23  Yes Latrelle Dodrill, MD  amLODipine (NORVASC) 10 MG tablet Take 1 tablet (10 mg total) by mouth at bedtime. 04/30/23  Yes Latrelle Dodrill, MD  aspirin EC 81 MG EC tablet Take 1 tablet (81 mg total) by mouth daily. Patient taking differently: Take 81 mg by mouth in the morning. 01/19/13  Yes Garnetta Buddy, MD  atorvastatin (LIPITOR) 10 MG tablet  Take 1 tablet (10 mg total) by mouth daily. 04/30/23  Yes Latrelle Dodrill, MD  Cholecalciferol (VITAMIN D3 PO) Take 2,000 Int'l Units/day by mouth.   Yes [provider]  Cyanocobalamin (B-12) 1000 MCG CAPS Take 1,000 mcg by mouth daily. 05/21/21  Yes Latrelle Dodrill, MD  fexofenadine (ALLEGRA) 180 MG tablet Take 180 mg by mouth daily.   Yes [provider]  fluticasone (FLONASE) 50 MCG/ACT nasal spray Place 2 sprays into both nostrils daily. 04/30/23  Yes Latrelle Dodrill, MD  lactulose (CHRONULAC) 10 GM/15ML solution Take 15 mLs (10 g total) by mouth daily. 04/30/23  Yes Latrelle Dodrill, MD  methocarbamol (ROBAXIN) 500 MG tablet Take 1 tablet (500 mg total) by mouth 2 (two) times daily as needed for muscle spasms. 07/06/23  Yes Aprill Banko, Janace Aris, MD  Omega-3 Fatty Acids (FISH OIL PO) Take 1 capsule by mouth in the morning.    Yes [provider]  predniSONE (DELTASONE) 20 MG tablet Take 2 tablets (40 mg total) by mouth daily with breakfast for 5 days. 07/06/23 07/11/23 Yes Casen Pryor, Janace Aris, MD  umeclidinium-vilanterol [facility] ELLIPTA) 62.5-25 MCG/ACT AEPB Inhale 1 puff into the lungs daily. 05/21/23  Yes McDiarmid, Leighton Roach, MD    Family History Family History  Problem Relation Age of Onset   Hypertension Mother    Asthma Mother    Coronary artery disease Father    Heart attack Father    Asthma Father    Diabetes Sister    Drug abuse Sister    Stroke Sister    Drug abuse Sister    Heart disease Sister    Coronary artery disease Maternal Uncle    Stroke Maternal Grandmother    Coronary artery disease Maternal Grandmother    Diabetes Paternal Grandmother    Kidney disease Paternal Grandmother    Colon cancer Neg Hx    Breast cancer Neg Hx    Colon polyps Neg Hx    Esophageal cancer Neg Hx    Stomach cancer Neg Hx    Rectal cancer Neg Hx     Social History Social History   Tobacco Use   Smoking status: Never    Passive exposure: Never    Smokeless tobacco: Never  Vaping Use   Vaping Use: Never used  Substance Use Topics   Alcohol use: Yes    Alcohol/week: 0.0 standard  drinks of alcohol    Comment: occassional drink at holidays   Drug use: Yes    Frequency: 3.0 times per week    Types: Marijuana, Cocaine, "Crack" cocaine    Comment: cocaine and THC + in UDS in November 2013. Last used 1 year ago as of 04/11/15     Allergies   Patient has no known allergies.   Review of Systems Review of Systems  Genitourinary:  Positive for flank pain.     Physical Exam Triage Vital Signs ED Triage Vitals [07/06/23 0817]  Enc Vitals Group     BP (!) 133/90     Pulse Rate 77     Resp 16     Temp 98.1 F (36.7 C)     Temp Source Oral     SpO2 94 %     Weight 141 lb (64 kg)     Height 5\' 1"  (1.549 m)     Head Circumference      Peak Flow      Pain Score 10     Pain Loc      Pain Edu?      Excl. in GC?    No data found.  Updated Vital Signs BP (!) 133/90 (BP Location: Left Arm)   Pulse 77   Temp 98.1 F (36.7 C) (Oral)   Resp 16   Ht 5\' 1"  (1.549 m)   Wt 64 kg   SpO2 94%   BMI 26.64 kg/m   Visual Acuity Right Eye Distance:   Left Eye Distance:   Bilateral Distance:    Right Eye Near:   Left Eye Near:    Bilateral Near:     Physical Exam Vitals reviewed.  Constitutional:      General: She is not in acute distress.    Appearance: She is not ill-appearing, toxic-appearing or diaphoretic.  HENT:     Mouth/Throat:     Mouth: Mucous membranes are moist.  Eyes:     Extraocular Movements: Extraocular movements intact.     Conjunctiva/sclera: Conjunctivae normal.     Pupils: Pupils are equal, round, and reactive to light.  Cardiovascular:     Rate and Rhythm: Normal rate and regular rhythm.     Heart sounds: No murmur heard. Pulmonary:     Effort: Pulmonary effort is normal. No respiratory distress.     Breath sounds: No stridor. No wheezing, rhonchi or rales.     Comments: There is tenderness  bilaterally in the lower chest in the mid axillary line and a little forward of the mid axillary line.  No deformity and no rash Musculoskeletal:     Cervical back: Neck supple.  Lymphadenopathy:     Cervical: No cervical adenopathy.  Skin:    Coloration: Skin is not pale.  Neurological:     General: No focal deficit present.     Mental Status: She is alert and oriented to person, place, and time.  Psychiatric:        Behavior: Behavior normal.      UC Treatments / Results  Labs (all labs ordered are listed, but only abnormal results are displayed) Labs Reviewed - No data to display  EKG   Radiology DG Chest 2 View  Result Date: 07/06/2023 CLINICAL DATA:  bilateral chest pain in mid axillary line, worsening, more persistent. EXAM: CHEST - 2 VIEW COMPARISON:  04/21/2023. FINDINGS: Bilateral lung fields are clear. Bilateral costophrenic angles are clear. Normal cardio-mediastinal silhouette. No acute osseous abnormalities. The  soft tissues are within normal limits. IMPRESSION: No active cardiopulmonary disease. Electronically Signed   By: Jules Schick M.D.   On: 07/06/2023 08:48    Procedures Procedures (including critical care time)  Medications Ordered in UC Medications - No data to display  Initial Impression / Assessment and Plan / UC Course  I have reviewed the triage vital signs and the nursing notes.  Pertinent labs & imaging results that were available during my care of the patient were reviewed by me and considered in my medical decision making (see chart for details).        Chest x-ray is benign without any bony problem.  Lung fields are clear and there is no fluid.  5-day burst of prednisone is sent in as is some methocarbamol.  I have asked her to follow-up with her primary care about this issue Final Clinical Impressions(s) / UC Diagnoses   Final diagnoses:  Side pain     Discharge Instructions      Your chest x-ray is clear and does not show any  problem in the bones of the chest and there is no fluid or mass seen on this study  Take prednisone 20 mg--2 daily for 5 days  Methocarbamol 500 mg--1 every 12 hours as needed for muscle pain.  This medication can make you sleepy or dizzy  Please follow-up with your primary care about this issue     ED Prescriptions     Medication Sig Dispense Auth. Provider   methocarbamol (ROBAXIN) 500 MG tablet Take 1 tablet (500 mg total) by mouth 2 (two) times daily as needed for muscle spasms. 15 tablet Doyal Saric, Janace Aris, MD   predniSONE (DELTASONE) 20 MG tablet Take 2 tablets (40 mg total) by mouth daily with breakfast for 5 days. 10 tablet Marlinda Mike Janace Aris, MD      I have reviewed the PDMP during this encounter.   Zenia Resides, MD 07/06/23 [phone]

## 2023-07-06 NOTE — ED Triage Notes (Signed)
Patient here today with c/o bilat side pain just under breast that has been going on since April. Patient thinks that the pain came from when she was doing exercises at the gym. She states that the pain is worsening and woke her up in the middle of the night. Does not hurt to breath. Pain comes and goes but has been more frequent.

## 2023-07-14 DIAGNOSIS — N183 Chronic kidney disease, stage 3 unspecified: Secondary | ICD-10-CM | POA: Diagnosis not present

## 2023-07-14 DIAGNOSIS — N390 Urinary tract infection, site not specified: Secondary | ICD-10-CM | POA: Diagnosis not present

## 2023-07-14 DIAGNOSIS — N189 Chronic kidney disease, unspecified: Secondary | ICD-10-CM | POA: Diagnosis not present

## 2023-07-14 DIAGNOSIS — N2581 Secondary hyperparathyroidism of renal origin: Secondary | ICD-10-CM | POA: Diagnosis not present

## 2023-07-14 DIAGNOSIS — I129 Hypertensive chronic kidney disease with stage 1 through stage 4 chronic kidney disease, or unspecified chronic kidney disease: Secondary | ICD-10-CM | POA: Diagnosis not present

## 2023-07-14 DIAGNOSIS — D631 Anemia in chronic kidney disease: Secondary | ICD-10-CM | POA: Diagnosis not present

## 2023-07-17 ENCOUNTER — Other Ambulatory Visit: Payer: Self-pay | Admitting: Nephrology

## 2023-07-17 DIAGNOSIS — R109 Unspecified abdominal pain: Secondary | ICD-10-CM

## 2023-07-20 ENCOUNTER — Ambulatory Visit
Admission: RE | Admit: 2023-07-20 | Discharge: 2023-07-20 | Disposition: A | Discharge status: Home / Self Care | Payer: 59 | Source: Ambulatory Visit | Attending: Nephrology | Admitting: Nephrology

## 2023-07-20 DIAGNOSIS — R109 Unspecified abdominal pain: Secondary | ICD-10-CM

## 2023-07-20 DIAGNOSIS — Q632 Ectopic kidney: Secondary | ICD-10-CM | POA: Diagnosis not present

## 2023-07-27 DIAGNOSIS — N183 Chronic kidney disease, stage 3 unspecified: Secondary | ICD-10-CM | POA: Diagnosis not present

## 2023-08-21 DIAGNOSIS — H2521 Age-related cataract, morgagnian type, right eye: Secondary | ICD-10-CM | POA: Diagnosis not present

## 2023-11-16 DIAGNOSIS — N183 Chronic kidney disease, stage 3 unspecified: Secondary | ICD-10-CM | POA: Diagnosis not present

## 2023-11-17 ENCOUNTER — Encounter: Payer: Self-pay | Admitting: Family Medicine

## 2023-11-17 ENCOUNTER — Ambulatory Visit: Payer: 59 | Admitting: Family Medicine

## 2023-11-17 VITALS — BP 133/97 | HR 75 | Ht 60.0 in | Wt 151.6 lb

## 2023-11-17 DIAGNOSIS — I10 Essential (primary) hypertension: Secondary | ICD-10-CM | POA: Diagnosis not present

## 2023-11-17 DIAGNOSIS — E785 Hyperlipidemia, unspecified: Secondary | ICD-10-CM | POA: Diagnosis not present

## 2023-11-17 DIAGNOSIS — Z23 Encounter for immunization: Secondary | ICD-10-CM

## 2023-11-17 DIAGNOSIS — Z0000 Encounter for general adult medical examination without abnormal findings: Secondary | ICD-10-CM

## 2023-11-17 DIAGNOSIS — N184 Chronic kidney disease, stage 4 (severe): Secondary | ICD-10-CM

## 2023-11-17 DIAGNOSIS — J449 Chronic obstructive pulmonary disease, unspecified: Secondary | ICD-10-CM | POA: Diagnosis not present

## 2023-11-17 MED ORDER — TRELEGY ELLIPTA 100-62.5-25 MCG/ACT IN AEPB: 1.0000 | INHALATION_SPRAY | Freq: Every day | RESPIRATORY_TRACT | 3 refills | Status: DC

## 2023-11-17 NOTE — Progress Notes (Unsigned)
    SUBJECTIVE:   CHIEF COMPLAINT / HPI:   Jennifer Moss is a 72 year old with PMHx of hyperlipidemia, hyptertensive CKD 4, COPD, and polyneuropathy here for follow up.  CKD 4: Patient had labwork yesterday at [facility] and wants to know the results. Denies any flank pain.  Hypertension: Patient is compliant with amlodipine 10 mg daily. Reports BP of 130/80 at home.  COPD: Patient endorses no relief to her breathing troubles. She coughs at night and can sometimes hear wheezing when she lies down for an afternoon nap.  Polyneuropathy: Patient denied any muscle pain. Has stopped taking muscle spasm medication because she it "made her gain weight."  Hyperlipidemia: Patient is currently taking lipitor 10 mg daily.   PERTINENT  PMH / PSH: HLD, HTN, CKD 4, COPD  OBJECTIVE:   BP (!) 136/97   Pulse 78   Ht 5' (1.524 m)   Wt 151 lb 9.6 oz (68.8 kg)   SpO2 100%   BMI 29.61 kg/m   Physical Exam Constitutional:      Appearance: Normal appearance.  Cardiovascular:     Rate and Rhythm: Normal rate and regular rhythm.     Pulses: Normal pulses.     Heart sounds: Normal heart sounds.  Pulmonary:     Effort: Pulmonary effort is normal.     Breath sounds: Normal breath sounds. No wheezing.  Neurological:     Mental Status: She is alert.  Psychiatric:        Mood and Affect: Mood normal.        Behavior: Behavior normal.     ASSESSMENT/PLAN:   No problem-specific Assessment & Plan notes found for this encounter.  Jennifer Moss is a 72 year old with PMHx of hyperlipidemia, hyptertensive CKD 4, COPD, and polyneuropathy here for follow up and blood work.  CKD 4: Patient's kidney function is decreasing. Her recent labs from 11/8 show a Creatinine of 2.6 and a eGFR of 17. Discussed with patient that her nephrologist will likely want to discussed dialysis in the near future.  Hypertension: Patient is slightly hypertensive in office today. With her current CKD progression, will defer to  nephrology for management as this is likely contributing to kidney functioning decreasing.   COPD: Patient's recent PFT suggest some that she may have underlying COPD which likely is contributing to her breathing difficulty. Will replace her breathing treatment with Trelegy 1 inhalation daily to see if there is any improvement.  Hyperlipidemia: Ordered a lipid panel. Will follow up to see based on results to see if she is controlled at current dose of lipitor 10 mg daily.  Polyneuropathy: Patient is doing well after discontinuing her muscle relaxant medication. Will remove this from her medications list.  Jennifer Moss, Medical Student [facility]

## 2023-11-17 NOTE — Patient Instructions (Signed)
It was great to see you again today.  Changing your inhaler to something called Trelegy.  1 inhalation daily.  You can stop the Anoro Ellipta.  Checking cholesterol today. COVID booster today  Follow-up with me in 6 months, sooner if needed  Be well, Dr. Pollie Meyer

## 2023-11-18 LAB — LIPID PANEL
Chol/HDL Ratio: 1.8 ratio (ref 0.0–4.4)
Cholesterol, Total: 153 mg/dL (ref 100–199)
HDL: 87 mg/dL (ref >39)
LDL Chol Calc (NIH): 51 mg/dL (ref 0–99)
Triglycerides: 77 mg/dL (ref 0–149)
VLDL Cholesterol Cal: 15 mg/dL (ref 5–40)

## 2023-11-19 DIAGNOSIS — Z0000 Encounter for general adult medical examination without abnormal findings: Secondary | ICD-10-CM | POA: Insufficient documentation

## 2023-11-19 DIAGNOSIS — J449 Chronic obstructive pulmonary disease, unspecified: Secondary | ICD-10-CM | POA: Insufficient documentation

## 2023-11-19 HISTORY — DX: Encounter for general adult medical examination without abnormal findings: Z00.00

## 2023-11-19 NOTE — Assessment & Plan Note (Signed)
Patient is slightly hypertensive in office today. With her current CKD progression, will defer to nephrology for management as this is likely contributing to kidney functioning decreasing.  Reports home blood pressures controlled.

## 2023-11-19 NOTE — Assessment & Plan Note (Signed)
Check lipids today, continue statin

## 2023-11-19 NOTE — Assessment & Plan Note (Signed)
Patient's recent PFT suggest some that she may have underlying COPD which likely is contributing to her breathing difficulty.  Stop Anoro, start Trelegy to see whether inhaled corticosteroid helps.

## 2023-11-19 NOTE — Assessment & Plan Note (Signed)
COVID booster given today

## 2023-11-19 NOTE — Assessment & Plan Note (Signed)
Patient's kidney function is decreasing. Her recent labs from nephrology (visible and [facility] DXA) show a Creatinine of 2.88 and a eGFR of 17. Discussed with patient that her nephrologist will likely want to discuss dialysis access in the near future.

## 2023-11-20 DIAGNOSIS — N184 Chronic kidney disease, stage 4 (severe): Secondary | ICD-10-CM | POA: Diagnosis not present

## 2023-11-20 DIAGNOSIS — I129 Hypertensive chronic kidney disease with stage 1 through stage 4 chronic kidney disease, or unspecified chronic kidney disease: Secondary | ICD-10-CM | POA: Diagnosis not present

## 2023-11-30 ENCOUNTER — Telehealth: Payer: Self-pay

## 2023-11-30 NOTE — Patient Outreach (Signed)
Renal coordinator attempted to call patient on today regarding Safe Start referral. Successful outreach made today with patient. Safe Start telephone assessment scheduled for Wednesday, December 02, 2023 to determine if there are any VBCI resources needed.   Baruch Gouty [facility] Assistant  VBCI-Population Health [phone]

## 2023-12-03 ENCOUNTER — Telehealth: Payer: Self-pay

## 2023-12-03 DIAGNOSIS — N184 Chronic kidney disease, stage 4 (severe): Secondary | ICD-10-CM

## 2023-12-03 NOTE — Patient Outreach (Signed)
Renal coordinator attempted to call patient on today regarding Safe Start referral. Successful outreach made today with patient. Patient shared she would like to take advantage of VBCI resources such as Sport and exercise psychologist.   Referral sent to [facility] for follow up. Coordinator will check in with patient in 3 months per request.  [facility]BCI [facility] Assistant-Population Health [phone]

## 2023-12-04 ENCOUNTER — Telehealth: Payer: Self-pay | Admitting: *Deleted

## 2023-12-04 NOTE — Progress Notes (Signed)
  Care Coordination   Note   12/04/2023 Name: ROWAN LUEBBE MRN: [id number] DOB: [birth year]  JESSIC BECKLUND is a 72 y.o. year old female who sees Pollie Meyer, Estevan Ryder, MD for primary care. I reached out to Noemi Chapel by phone today to offer care coordination services.  Ms. Emig was given information about Care Coordination services today including:   The Care Coordination services include support from the care team which includes your Nurse Coordinator, Clinical Social Worker, or Pharmacist.  The Care Coordination team is here to help remove barriers to the health concerns and goals most important to you. Care Coordination services are voluntary, and the patient may decline or stop services at any time by request to their care team member.   Care Coordination Consent Status: Patient agreed to services and verbal consent obtained.   Follow up plan:  Telephone appointment with care coordination team member scheduled for:  12/07/23  Encounter Outcome:  Patient Scheduled  [facility] Coordination Care Guide  Direct Dial: [phone]

## 2023-12-07 ENCOUNTER — Other Ambulatory Visit: Payer: Self-pay | Admitting: *Deleted

## 2023-12-07 ENCOUNTER — Encounter: Payer: Self-pay | Admitting: *Deleted

## 2023-12-07 NOTE — Patient Outreach (Signed)
Care Management   Visit Note  12/07/2023 Name: Jennifer Moss MRN: [id number] DOB: 08/30/1951  Subjective: Jennifer Moss is a 72 y.o. year old female who is a primary care patient of Pollie Meyer Estevan Ryder, MD. The Care Management team was consulted for assistance.      Engaged with patient spoke with patient by telephone.    Goals Addressed             This Visit's Progress    RNCM Care Management Expected Outcomes: Monitor, Self-Manage and Reduce Symptoms of: CKD       Current Barriers:  Knowledge Deficits related to plan of care for management of CKD Stage 4  Chronic Disease Management support and education needs related to CKD Stage 4   RNCM Clinical Goal(s):  Patient will verbalize basic understanding of  CKD Stage 4 disease process and self health management plan as evidenced by verbal explanation, recognizing symptoms, lifestyle modifications take all medications exactly as prescribed and will call provider for medication related questions as evidenced by compliance with all medications attend all scheduled medical appointments: with primary care provider and specialist as evidenced by keeping all scheduled appointments demonstrate Improved and Ongoing adherence to prescribed treatment plan for CKD Stage 4 as evidenced by consistent medication compliance, symptom monitoring, continued lifestyle modifications continue to work with RN Care Manager to address care management and care coordination needs related to  CKD Stage 4 as evidenced by adherence to CM Team Scheduled appointments through collaboration with RN Care manager, provider, and care team.   Interventions: Evaluation of current treatment plan related to  self management and patient's adherence to plan as established by provider    Chronic Kidney Disease Interventions:  (Status:  New goal. and Goal on track:  Yes.) Long Term Goal Evaluation of current treatment plan related to chronic kidney disease self management  and patient's adherence to plan as established by provider. Regularly checks her blood pressure, does not keep a log. She states she has a follow up at [facility] for evaluation/candidacy for transplant on 01-14-2024.      Provided education to patient re: stroke prevention, s/s of heart attack and stroke    Reviewed prescribed diet renal. High quality proteins: lean meats, fish, eggs Incorporate healthy fats like olive oil and avocado oil in place of vegetable oil LIMIT sodium intake by avoiding processed and packaged foods that are high in sodium. Choose options like fresh or frozen fruits/vegetables instead LIMIT foods high in potassium such as bananas, oranges, potatoes and tomatoes. Choose options like apples, berries, grapes and vegetables such as lettuce and cucumbers Monitor fluid intake and track all fluids Reviewed medications with patient and discussed importance of compliance. Reports compliance with all medications.    Counseled on adverse effects of illicit drug and excessive alcohol use in patients with chronic kidney disease    Counseled on the importance of exercise goals with target of 150 minutes per week.  Works out 3x/week for 60 minutes.    Advised patient, providing education and rationale, to monitor blood pressure daily and record, calling PCP for findings outside established parameters    Discussed complications of poorly controlled blood pressure such as heart disease, stroke, circulatory complications, vision complications, kidney impairment, sexual dysfunction    Reviewed scheduled/upcoming provider appointments including: 05-30-2024 AWV Advised patient to discuss any new changes with provider    Discussed plans with patient for ongoing care management follow up and provided patient with direct contact information for care  management team    Screening for signs and symptoms of depression related to chronic disease state      Discussed the impact of chronic kidney disease on  daily life and mental health and acknowledged and normalized feelings of disempowerment, fear, and frustration    Assessed social determinant of health barriers    Engage patient in early, proactive and ongoing discussion about goals of care and what matters most to them    Support coping and stress management by recognizing current strategies and assist in developing new strategies such as mindfulness, journaling, relaxation techniques, problem-solving    Last practice recorded BP readings:  BP Readings from Last 3 Encounters:  11/17/23 (!) 133/97  07/06/23 (!) 133/90  05/21/23 107/82   Most recent eGFR/CrCl:  Lab Results  Component Value Date   EGFR 28 (L) 10/20/2022    No components found for: "CRCL"  Patient Goals/Self-Care Activities: Take all medications as prescribed Attend all scheduled provider appointments Call pharmacy for medication refills 3-7 days in advance of running out of medications Attend church or other social activities Perform all self care activities independently  Perform IADL's (shopping, preparing meals, housekeeping, managing finances) independently Call provider office for new concerns or questions   Follow Up Plan:  Telephone follow up appointment with care management team member scheduled for:  01-11-2024 at 10:00 am             Consent to Services:  Patient was given information about care management services, agreed to services, and gave verbal consent to participate.   Plan: Telephone follow up appointment with care management team member scheduled for:01-11-2024 at 10:00 am  Danise Edge, BSN RN RN Care Manager  [facility]  Ambulatory Care Management  Direct Number: [phone]

## 2023-12-07 NOTE — Patient Instructions (Signed)
Care Management   Initial Visit Note  12/07/2023 Name: Jennifer Moss MRN: [id number] DOB: January 14, 1951  Jennifer Moss is enrolled in a Managed Medicaid plan: No. Outreach attempt today was successful.   Subjective:   Objective:  Assessment: Jennifer Moss is a 72 y.o. year old female who sees Pollie Meyer, Estevan Ryder, MD for primary care. The care management team was consulted for assistance with care management and care coordination needs related to Disease Management and Educational Needs.   Review of patient status, including review of consultants reports, relevant laboratory and other test results, and collaboration with appropriate care team members and the patient's provider was performed as part of comprehensive patient evaluation and provision of care management services.    SDOH (Social Determinants of Health) screening performed today. 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  Chronic Disease Management support and education needs related to CKD Stage 4   RNCM Clinical Goal(s):  Patient will verbalize basic understanding of  CKD Stage 4 disease process and self health management plan as evidenced by verbal explanation, recognizing symptoms, lifestyle modifications take all medications exactly as prescribed and will call provider for medication related questions as evidenced by compliance with all medications attend all scheduled medical appointments: with primary care provider and specialist as evidenced by keeping all scheduled appointments demonstrate Improved and Ongoing adherence to prescribed treatment plan for CKD Stage 4 as evidenced by consistent medication compliance, symptom monitoring, continued lifestyle  modifications continue to work with RN Care Manager to address care management and care coordination needs related to  CKD Stage 4 as evidenced by adherence to CM Team Scheduled appointments through collaboration with RN Care manager, provider, and care team.   Interventions: Evaluation of current treatment plan related to  self management and patient's adherence to plan as established by provider    Chronic Kidney Disease Interventions:  (Status:  New goal. and Goal on track:  Yes.) Long Term Goal Evaluation of current treatment plan related to chronic kidney disease self management and patient's adherence to plan as established by provider. Regularly checks her blood pressure, does not keep a log. She states she has a follow up at [facility] for evaluation/candidacy for transplant on 01-14-2024.      Provided education to patient re: stroke prevention, s/s of heart attack and stroke    Reviewed prescribed diet renal. High quality proteins: lean meats, fish, eggs Incorporate healthy fats like olive oil and avocado oil in place of vegetable oil LIMIT sodium intake by avoiding processed and packaged foods that are high in sodium. Choose options like fresh or frozen fruits/vegetables instead LIMIT foods high in potassium such as bananas, oranges, potatoes and tomatoes. Choose options like apples, berries, grapes and vegetables such as lettuce and cucumbers Monitor fluid intake and track all fluids Reviewed medications with patient and discussed importance of compliance. Reports compliance with all medications.    Counseled on adverse effects of illicit drug and excessive alcohol use in patients with chronic kidney disease    Counseled on the importance of exercise goals with target of 150 minutes per week.  Works out 3x/week for 60 minutes.  Advised patient, providing education and rationale, to monitor blood pressure daily and record, calling PCP for findings outside established parameters     Discussed complications of poorly controlled blood pressure such as heart disease, stroke, circulatory complications, vision complications, kidney impairment, sexual dysfunction    Reviewed scheduled/upcoming provider appointments including: 05-30-2024 AWV Advised patient to discuss any new changes with provider    Discussed plans with patient for ongoing care management follow up and provided patient with direct contact information for care management team    Screening for signs and symptoms of depression related to chronic disease state      Discussed the impact of chronic kidney disease on daily life and mental health and acknowledged and normalized feelings of disempowerment, fear, and frustration    Assessed social determinant of health barriers    Engage patient in early, proactive and ongoing discussion about goals of care and what matters most to them    Support coping and stress management by recognizing current strategies and assist in developing new strategies such as mindfulness, journaling, relaxation techniques, problem-solving    Last practice recorded BP readings:  BP Readings from Last 3 Encounters:  11/17/23 (!) 133/97  07/06/23 (!) 133/90  05/21/23 107/82   Most recent eGFR/CrCl:  Lab Results  Component Value Date   EGFR 28 (L) 10/20/2022    No components found for: "CRCL"  Patient Goals/Self-Care Activities: Take all medications as prescribed Attend all scheduled provider appointments Call pharmacy for medication refills 3-7 days in advance of running out of medications Attend church or other social activities Perform all self care activities independently  Perform IADL's (shopping, preparing meals, housekeeping, managing finances) independently Call provider office for new concerns or questions   Follow Up Plan:  Telephone follow up appointment with care management team member scheduled for:  01-11-2024 at 10:00 am           Follow up plan:  Telephone follow  up appointment with care management team member scheduled for:01-11-2024 at 10:00 am  Jennifer Moss was given information about Care Management services today including:  Care Management services include personalized support from designated clinical staff supervised by a physician, including individualized plan of care and coordination with other care providers 24/7 contact phone numbers for assistance for urgent and routine care needs. The patient may stop CCM services at any time (effective at the end of the month) by phone call to the office staff.  Patient agreed to services and verbal consent obtained.  Danise Edge, BSN RN RN Care Manager  Jennifer Moss  Ambulatory Care Management  Direct Number: [phone]

## 2024-01-11 ENCOUNTER — Other Ambulatory Visit: Payer: Self-pay | Admitting: *Deleted

## 2024-01-11 NOTE — Patient Outreach (Signed)
 Care Management   Visit Note  01/11/2024 Name: Jennifer Moss MRN: [id number] DOB: 05-Jan-1951  Subjective: Jennifer Moss is a 73 y.o. year old female who is a primary care patient of [name], MD. The Care Management team was consulted for assistance.      Engaged with patient spoke with patient by telephone.    Goals Addressed             This Visit's Progress    RNCM Care Management Expected Outcomes: Monitor, Self-Manage and Reduce Symptoms of: CKD       Current Barriers:  Knowledge Deficits related to plan of care for management of CKD Stage 4  Chronic Disease Management support and education needs related to CKD Stage 4   RNCM Clinical Goal(s):  Patient will verbalize basic understanding of  CKD Stage 4 disease process and self health management plan as evidenced by verbal explanation, recognizing symptoms, lifestyle modifications take all medications exactly as prescribed and will call provider for medication related questions as evidenced by compliance with all medications attend all scheduled medical appointments: with primary care provider and specialist as evidenced by keeping all scheduled appointments demonstrate Improved and Ongoing adherence to prescribed treatment plan for CKD Stage 4 as evidenced by consistent medication compliance, symptom monitoring, continued lifestyle modifications continue to work with RN Care Manager to address care management and care coordination needs related to  CKD Stage 4 as evidenced by adherence to CM Team Scheduled appointments through collaboration with RN Care manager, provider, and care team.   Interventions: Evaluation of current treatment plan related to  self management and patient's adherence to plan as established by provider    Chronic Kidney Disease Interventions:  (Status:  New goal. and Goal on track:  Yes.) Long Term Goal Evaluation of current treatment plan related to chronic kidney disease self management  and patient's adherence to plan as established by provider. Patient states she occasionally checks her BP, last check 120/83. She states she has a follow up at [facility] for continued evaluation/candidacy for transplant on 02-22-2024. She reports that she scheduled to follow up with her nephrologist, Dr. Marlee on 02-26-2024 and again at [facility] on 03-03-2024.  Patient is very eager and willing to do what it takes to ensure she is managing her CKD properly. Reflective listening and support provided. Provided education to patient re: stroke prevention, s/s of heart attack and stroke    Reviewed prescribed diet renal. Reviewed medications with patient and discussed importance of compliance. Reports compliance with all medications.    Counseled on adverse effects of illicit drug and excessive alcohol use in patients with chronic kidney disease    Counseled on the importance of exercise goals with target of 150 minutes per week.  Reports that she had cut back on her exercise due to helping a relative but she plans to return to the gym on Thursday/Friday, this week. Advised patient, providing education and rationale, to monitor blood pressure daily and record, calling PCP for findings outside established parameters    Discussed complications of poorly controlled blood pressure such as heart disease, stroke, circulatory complications, vision complications, kidney impairment, sexual dysfunction    Reviewed scheduled/upcoming provider appointments including: Nephrologist 02-26-2024 Advised patient to discuss any new changes with provider    Discussed plans with patient for ongoing care management follow up and provided patient with direct contact information for care management team    Screening for signs and symptoms of depression related to chronic disease  state      Discussed the impact of chronic kidney disease on daily life and mental health and acknowledged and normalized feelings of disempowerment, fear, and  frustration    Assessed social determinant of health barriers    Engage patient in early, proactive and ongoing discussion about goals of care and what matters most to them    Support coping and stress management by recognizing current strategies and assist in developing new strategies such as mindfulness, journaling, relaxation techniques, problem-solving    Last practice recorded BP readings:  BP Readings from Last 3 Encounters:  11/17/23 (!) 133/97  07/06/23 (!) 133/90  05/21/23 107/82   Most recent eGFR/CrCl:  Lab Results  Component Value Date   EGFR 28 (L) 10/20/2022    No components found for: CRCL  Patient Goals/Self-Care Activities: Take all medications as prescribed Attend all scheduled provider appointments Call pharmacy for medication refills 3-7 days in advance of running out of medications Attend church or other social activities Perform all self care activities independently  Perform IADL's (shopping, preparing meals, housekeeping, managing finances) independently Call provider office for new concerns or questions   Follow Up Plan:  Telephone follow up appointment with care management team member scheduled for:  02-26-2024 at 10:00 am           Consent to Services:  Patient was given information about care management services, agreed to services, and gave verbal consent to participate.   Plan: Telephone follow up appointment with care management team member scheduled for:02-26-2024 at 10:00 am  [name], BSN RN RN Care Manager  [facility]  Ambulatory Care Management  Direct Number: [phone]

## 2024-01-11 NOTE — Patient Instructions (Signed)
 Visit Information  Thank you for taking time to visit with me today. Please don't hesitate to contact me if I can be of assistance to you before our next scheduled telephone appointment.  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  Chronic Disease Management support and education needs related to CKD Stage 4   RNCM Clinical Goal(s):  Patient will verbalize basic understanding of  CKD Stage 4 disease process and self health management plan as evidenced by verbal explanation, recognizing symptoms, lifestyle modifications take all medications exactly as prescribed and will call provider for medication related questions as evidenced by compliance with all medications attend all scheduled medical appointments: with primary care provider and specialist as evidenced by keeping all scheduled appointments demonstrate Improved and Ongoing adherence to prescribed treatment plan for CKD Stage 4 as evidenced by consistent medication compliance, symptom monitoring, continued lifestyle modifications continue to work with RN Care Manager to address care management and care coordination needs related to  CKD Stage 4 as evidenced by adherence to CM Team Scheduled appointments through collaboration with RN Care manager, provider, and care team.   Interventions: Evaluation of current treatment plan related to  self management and patient's adherence to plan as established by provider    Chronic Kidney Disease Interventions:  (Status:  New goal. and Goal on track:  Yes.) Long Term Goal Evaluation of current treatment plan related to chronic kidney disease self management and patient's adherence to plan as established by provider. Patient states she occasionally checks  her BP, last check 120/83. She states she has a follow up at [facility] for continued evaluation/candidacy for transplant on 02-22-2024. She reports that she scheduled to follow up with her nephrologist, Dr. Marlee on 02-26-2024 and again at [facility] on 03-03-2024.  Patient is very eager and willing to do what it takes to ensure she is managing her CKD properly. Reflective listening and support provided. Provided education to patient re: stroke prevention, s/s of heart attack and stroke    Reviewed prescribed diet renal. Reviewed medications with patient and discussed importance of compliance. Reports compliance with all medications.    Counseled on adverse effects of illicit drug and excessive alcohol use in patients with chronic kidney disease    Counseled on the importance of exercise goals with target of 150 minutes per week.  Reports that she had cut back on her exercise due to helping a relative but she plans to return to the gym on Thursday/Friday, this week. Advised patient, providing education and rationale, to monitor blood pressure daily and record, calling PCP for findings outside established parameters    Discussed complications of poorly controlled blood pressure such as heart disease, stroke, circulatory complications, vision complications, kidney impairment, sexual dysfunction    Reviewed scheduled/upcoming provider appointments including: Nephrologist 02-26-2024 Advised patient to discuss any new changes with provider    Discussed plans with patient for ongoing care management follow up and provided patient with direct contact information for care management team    Screening for signs and symptoms of depression related to chronic disease state      Discussed the impact of chronic kidney disease on daily life and mental health and  acknowledged and normalized feelings of disempowerment, fear, and frustration    Assessed social determinant of health barriers    Engage patient in early, proactive and  ongoing discussion about goals of care and what matters most to them    Support coping and stress management by recognizing current strategies and assist in developing new strategies such as mindfulness, journaling, relaxation techniques, problem-solving    Last practice recorded BP readings:  BP Readings from Last 3 Encounters:  11/17/23 (!) 133/97  07/06/23 (!) 133/90  05/21/23 107/82   Most recent eGFR/CrCl:  Lab Results  Component Value Date   EGFR 28 (L) 10/20/2022    No components found for: CRCL  Patient Goals/Self-Care Activities: Take all medications as prescribed Attend all scheduled provider appointments Call pharmacy for medication refills 3-7 days in advance of running out of medications Attend church or other social activities Perform all self care activities independently  Perform IADL's (shopping, preparing meals, housekeeping, managing finances) independently Call provider office for new concerns or questions   Follow Up Plan:  Telephone follow up appointment with care management team member scheduled for:  02-26-2024 at 10:00 am           Our next appointment is by telephone on 02-26-2024 at 10:00 am  Please call the care guide team at [phone] if you need to cancel or reschedule your appointment.   If you are experiencing a Mental Health or Behavioral Health Crisis or need someone to talk to, please call the Suicide and Crisis Lifeline: 988 call the [place]  National Suicide Prevention Lifeline: 252-852-9786 or TTY: 908-554-1473 TTY 223-205-7758) to talk to a trained counselor call 1-800-273-TALK (toll free, 24 hour hotline) go to [facility] [address], [place] [phone])   Patient verbalizes understanding of instructions and care plan provided today and agrees to view in MyChart. Active MyChart status and patient understanding of how to access instructions and care plan via MyChart confirmed with patient.      Telephone follow up appointment with care management team member scheduled for:02-26-2024 at 10:00 am  [name], BSN RN RN Care Manager  [facility]  Ambulatory Care Management  Direct Number: [phone]

## 2024-02-15 DIAGNOSIS — N184 Chronic kidney disease, stage 4 (severe): Secondary | ICD-10-CM | POA: Diagnosis not present

## 2024-02-26 ENCOUNTER — Other Ambulatory Visit: Payer: 59 | Admitting: *Deleted

## 2024-02-26 ENCOUNTER — Other Ambulatory Visit: Payer: Self-pay | Admitting: *Deleted

## 2024-02-26 DIAGNOSIS — Z741 Need for assistance with personal care: Secondary | ICD-10-CM

## 2024-02-26 NOTE — Patient Outreach (Signed)
 Care Management   Visit Note  02/26/2024 Name: Jennifer Moss MRN: [id number] DOB: October 24, 1951  Subjective: Jennifer Moss is a 73 y.o. year old female who is a primary care patient of Pollie Meyer Estevan Ryder, MD. The Care Management team was consulted for assistance.      Engaged with patient spoke with patient by telephone.    Goals Addressed             This Visit's Progress    RNCM Care Management Expected Outcomes: Monitor, Self-Manage and Reduce Symptoms of: CKD       Current Barriers:  Knowledge Deficits related to plan of care for management of CKD Stage 4  Chronic Disease Management support and education needs related to CKD Stage 4   RNCM Clinical Goal(s):  Patient will verbalize basic understanding of  CKD Stage 4 disease process and self health management plan as evidenced by verbal explanation, recognizing symptoms, lifestyle modifications take all medications exactly as prescribed and will call provider for medication related questions as evidenced by compliance with all medications attend all scheduled medical appointments: with primary care provider and specialist as evidenced by keeping all scheduled appointments demonstrate Improved and Ongoing adherence to prescribed treatment plan for CKD Stage 4 as evidenced by consistent medication compliance, symptom monitoring, continued lifestyle modifications continue to work with RN Care Manager to address care management and care coordination needs related to  CKD Stage 4 as evidenced by adherence to CM Team Scheduled appointments through collaboration with RN Care manager, provider, and care team.   Interventions: Evaluation of current treatment plan related to  self management and patient's adherence to plan as established by provider    Chronic Kidney Disease Interventions:  (Status:  New goal. and Goal on track:  Yes.) Long Term Goal Evaluation of current treatment plan related to chronic kidney disease self management  and patient's adherence to plan as established by provider. Patient states she occasionally checks her BP, last check her systolic was within normal limits but her diastolic was slightly elevated. She is aware it is due to her kidneys. She continues working towards the process for her Kidney transplant. She will see Dr. Marisue Humble next month and she hopes that she will be a candidate for peritoneal HD. Patient is very eager and willing to do what it takes to ensure she is managing her CKD properly. Reflective listening and support provided. Provided education to patient re: stroke prevention, s/s of heart attack and stroke . Education and support provided.   Reviewed prescribed diet renal. Reviewed medications with patient and discussed importance of compliance. Reports compliance with all medications.    Counseled on adverse effects of illicit drug and excessive alcohol use in patients with chronic kidney disease    Counseled on the importance of exercise goals with target of 150 minutes per week.  Reports that she had cut back on her exercise due to helping a relative but she plans to return to the gym when the weather gets better. Advised patient, providing education and rationale, to monitor blood pressure daily and record, calling PCP for findings outside established parameters    Discussed complications of poorly controlled blood pressure such as heart disease, stroke, circulatory complications, vision complications, kidney impairment, sexual dysfunction    Reviewed scheduled/upcoming provider appointments including: Nephrologist 03-14-2024 Advised patient to discuss any new changes with provider    Discussed plans with patient for ongoing care management follow up and provided patient with direct contact information for care  management team    Screening for signs and symptoms of depression related to chronic disease state      Discussed the impact of chronic kidney disease on daily life and mental  health and acknowledged and normalized feelings of disempowerment, fear, and frustration    Assessed social determinant of health barriers    Engage patient in early, proactive and ongoing discussion about goals of care and what matters most to them    Support coping and stress management by recognizing current strategies and assist in developing new strategies such as mindfulness, journaling, relaxation techniques, problem-solving   Patient will benefit from continued outreach from [facility] for continued management while she waits her kidney transplant.  Last practice recorded BP readings:  BP Readings from Last 3 Encounters:  11/17/23 (!) 133/97  07/06/23 (!) 133/90  05/21/23 107/82   Most recent eGFR/CrCl:  Lab Results  Component Value Date   EGFR 28 (L) 10/20/2022    No components found for: "CRCL"  Patient Goals/Self-Care Activities: Take all medications as prescribed Attend all scheduled provider appointments Call pharmacy for medication refills 3-7 days in advance of running out of medications Attend church or other social activities Perform all self care activities independently  Perform IADL's (shopping, preparing meals, housekeeping, managing finances) independently Call provider office for new concerns or questions   Follow Up Plan:  Telephone follow up appointment with care management team member scheduled for:  03-25-2024 at 9:00 am           Consent to Services:  Patient was given information about care management services, agreed to services, and gave verbal consent to participate.   Plan: Telephone follow up appointment with care management team member scheduled for:03-25-2024 at 9:00 am  Larey Brick, BSN RN [facility], [facility] RN Care Manager Direct Dial: [phone]  Fax: [phone]

## 2024-02-26 NOTE — Addendum Note (Signed)
 Addended by: Ricky Stabs on: 02/26/2024 10:34 AM   Modules accepted: Orders

## 2024-02-26 NOTE — Patient Instructions (Signed)
 Visit Information  Thank you for taking time to visit with me today. Please don't hesitate to contact me if I can be of assistance to you before our next scheduled telephone appointment.  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  Chronic Disease Management support and education needs related to CKD Stage 4   RNCM Clinical Goal(s):  Patient will verbalize basic understanding of  CKD Stage 4 disease process and self health management plan as evidenced by verbal explanation, recognizing symptoms, lifestyle modifications take all medications exactly as prescribed and will call provider for medication related questions as evidenced by compliance with all medications attend all scheduled medical appointments: with primary care provider and specialist as evidenced by keeping all scheduled appointments demonstrate Improved and Ongoing adherence to prescribed treatment plan for CKD Stage 4 as evidenced by consistent medication compliance, symptom monitoring, continued lifestyle modifications continue to work with RN Care Manager to address care management and care coordination needs related to  CKD Stage 4 as evidenced by adherence to CM Team Scheduled appointments through collaboration with RN Care manager, provider, and care team.   Interventions: Evaluation of current treatment plan related to  self management and patient's adherence to plan as established by provider    Chronic Kidney Disease Interventions:  (Status:  New goal. and Goal on track:  Yes.) Long Term Goal Evaluation of current treatment plan related to chronic kidney disease self management and patient's adherence to plan as established by provider. Patient states she occasionally checks  her BP, last check her systolic was within normal limits but her diastolic was slightly elevated. She is aware it is due to her kidneys. She continues working towards the process for her Kidney transplant. She will see Dr. Marisue Humble next month and she hopes that she will be a candidate for peritoneal HD. Patient is very eager and willing to do what it takes to ensure she is managing her CKD properly. Reflective listening and support provided. Provided education to patient re: stroke prevention, s/s of heart attack and stroke . Education and support provided.   Reviewed prescribed diet renal. Reviewed medications with patient and discussed importance of compliance. Reports compliance with all medications.    Counseled on adverse effects of illicit drug and excessive alcohol use in patients with chronic kidney disease    Counseled on the importance of exercise goals with target of 150 minutes per week.  Reports that she had cut back on her exercise due to helping a relative but she plans to return to the gym when the weather gets better. Advised patient, providing education and rationale, to monitor blood pressure daily and record, calling PCP for findings outside established parameters    Discussed complications of poorly controlled blood pressure such as heart disease, stroke, circulatory complications, vision complications, kidney impairment, sexual dysfunction    Reviewed scheduled/upcoming provider appointments including: Nephrologist 03-14-2024 Advised patient to discuss any new changes with provider    Discussed plans with patient for ongoing care management follow up and provided patient with direct contact information for care management team    Screening for signs and symptoms of depression related to chronic disease state  Discussed the impact of chronic kidney disease on daily life and mental health and acknowledged and normalized feelings of disempowerment, fear, and frustration    Assessed  social determinant of health barriers    Engage patient in early, proactive and ongoing discussion about goals of care and what matters most to them    Support coping and stress management by recognizing current strategies and assist in developing new strategies such as mindfulness, journaling, relaxation techniques, problem-solving   Patient will benefit from continued outreach from [facility] for continued management while she waits her kidney transplant.  Last practice recorded BP readings:  BP Readings from Last 3 Encounters:  11/17/23 (!) 133/97  07/06/23 (!) 133/90  05/21/23 107/82   Most recent eGFR/CrCl:  Lab Results  Component Value Date   EGFR 28 (L) 10/20/2022    No components found for: "CRCL"  Patient Goals/Self-Care Activities: Take all medications as prescribed Attend all scheduled provider appointments Call pharmacy for medication refills 3-7 days in advance of running out of medications Attend church or other social activities Perform all self care activities independently  Perform IADL's (shopping, preparing meals, housekeeping, managing finances) independently Call provider office for new concerns or questions   Follow Up Plan:  Telephone follow up appointment with care management team member scheduled for:  03-25-2024 at 9:00 am           Our next appointment is by telephone on 03-25-2024 at 9:00 am  Please call the care guide team at [phone] if you need to cancel or reschedule your appointment.   If you are experiencing a Mental Health or Behavioral Health Crisis or need someone to talk to, please call the Suicide and Crisis Lifeline: 988 call the [place] National Suicide Prevention Lifeline: (702) 056-2055 or TTY: 930-469-4841 TTY (475)263-0248) to talk to a trained counselor call 1-800-273-TALK (toll free, 24 hour hotline) go to [facility] [address], [place] [phone])   Patient verbalizes understanding of  instructions and care plan provided today and agrees to view in MyChart. Active MyChart status and patient understanding of how to access instructions and care plan via MyChart confirmed with patient.     Telephone follow up appointment with care management team member scheduled for:03-25-2024 at 9:00 am  Larey Brick, BSN RN [facility], [facility] RN Care Manager Direct Dial: [phone]  Fax: [phone]

## 2024-03-03 ENCOUNTER — Telehealth: Payer: Self-pay | Admitting: *Deleted

## 2024-03-03 NOTE — Progress Notes (Signed)
 Complex Care Management Care Guide Note  03/03/2024 Name: Jennifer Moss MRN: [id number] DOB: 04-Sep-1951  Jennifer Moss is a 73 y.o. year old female who is a primary care patient of Latrelle Dodrill, MD and is actively engaged with the care management team. I reached out to Noemi Chapel by phone today to assist with scheduling  with the BSW.  Follow up plan: Unsuccessful telephone outreach attempt made. A HIPAA compliant phone message was left for the patient providing contact information and requesting a return call.  Gwenevere Ghazi  [facility]  Value-Based Care Institute, [facility] Guide  Direct Dial: [phone]  Fax [phone]

## 2024-03-04 NOTE — Progress Notes (Signed)
 Complex Care Management Care Guide Note  03/04/2024 Name: Jennifer Moss MRN: [id number] DOB: 07-Jun-1951  Jennifer Moss is a 73 y.o. year old female who is a primary care patient of Latrelle Dodrill, MD and is actively engaged with the care management team. I reached out to Noemi Chapel by phone today to assist with scheduling  with the BSW.  Follow up plan: Telephone appointment with complex care management team member scheduled for:  03/18/24  Gwenevere Ghazi  [facility]  Value-Based Care Institute, [facility] Guide  Direct Dial: [phone]  Fax [phone]

## 2024-03-14 DIAGNOSIS — I129 Hypertensive chronic kidney disease with stage 1 through stage 4 chronic kidney disease, or unspecified chronic kidney disease: Secondary | ICD-10-CM | POA: Diagnosis not present

## 2024-03-14 DIAGNOSIS — N2581 Secondary hyperparathyroidism of renal origin: Secondary | ICD-10-CM | POA: Diagnosis not present

## 2024-03-14 DIAGNOSIS — D631 Anemia in chronic kidney disease: Secondary | ICD-10-CM | POA: Diagnosis not present

## 2024-03-14 DIAGNOSIS — N189 Chronic kidney disease, unspecified: Secondary | ICD-10-CM | POA: Diagnosis not present

## 2024-03-14 DIAGNOSIS — I10 Essential (primary) hypertension: Secondary | ICD-10-CM | POA: Diagnosis not present

## 2024-03-14 DIAGNOSIS — N184 Chronic kidney disease, stage 4 (severe): Secondary | ICD-10-CM | POA: Diagnosis not present

## 2024-03-15 ENCOUNTER — Other Ambulatory Visit: Payer: Self-pay | Admitting: Family Medicine

## 2024-03-18 ENCOUNTER — Encounter: Payer: Self-pay | Admitting: Licensed Clinical Social Worker

## 2024-03-18 ENCOUNTER — Ambulatory Visit: Payer: Self-pay

## 2024-03-18 NOTE — Patient Outreach (Signed)
 Care Coordination   Initial Visit Note   03/18/2024 Name: Jennifer Moss MRN: [id number] DOB: Sep 04, 1951  Jennifer Moss is a 73 y.o. year old female who sees Pollie Meyer, Estevan Ryder, MD for primary care. I spoke with  Noemi Chapel by phone today.  What matters to the patients health and wellness today?  Patient is planning in advance for personal care after kidney transplant.      Goals Addressed             This Visit's Progress    Care Coordination Activities       Interventions Today    Flowsheet Row Most Recent Value  Chronic Disease   Chronic disease during today's visit Chronic Kidney Disease/End Stage Renal Disease (ESRD), Hypertension (HTN), Chronic Obstructive Pulmonary Disease (COPD)  General Interventions   General Interventions Discussed/Reviewed General Interventions Discussed, General Interventions Reviewed, Level of Care  [Pt reports she is on the transplant list and there is waiting for a donor.Pt only has her daughter for support but she works.Pt will need additional support in the home with personal care.]  Level of Care Personal Care Services  [SW will submit PCS request to provider to hold until patient has transplant and needs personal care. Pt reports she can perform all ADL's without assistance, but will need services once transplant is completed.]  Education Interventions   Education Provided Provided Education  [Pt is educated on Medicaid transportation and provided the contact number to enroll. Pt reports she drives and her daughter helps, but admits it is a strain as her daughter works and there are times pt does not feel well enough to drive.]              SDOH assessments and interventions completed:  Yes  SDOH Interventions Today    Flowsheet Row Most Recent Value  SDOH Interventions   Food Insecurity Interventions Intervention Not Indicated  [Foodstamps and [employer]]  Housing Interventions Intervention Not Indicated  Transportation Interventions  Intervention Not Indicated  [Has a car & educated on Medicaid transportation]  Utilities Interventions Intervention Not Indicated        Care Coordination Interventions:  Yes, provided   Follow up plan: No further intervention required.   Encounter Outcome:  Patient Visit Completed

## 2024-03-18 NOTE — Patient Instructions (Signed)
 Visit Information  Thank you for taking time to visit with me today. Please don't hesitate to contact me if I can be of assistance to you.   Following are the goals we discussed today:  Patient will await transplant and will notify provider to complete PCS forms.    If you are experiencing a Mental Health or Behavioral Health Crisis or need someone to talk to, please call 911  Patient verbalizes understanding of instructions and care plan provided today and agrees to view in MyChart. Active MyChart status and patient understanding of how to access instructions and care plan via MyChart confirmed with patient.     No further follow up required: No additional follow up is requested by patient.  Lysle Morales, BSW [place]  [facility], [facility] Social Worker Direct Dial: [phone]  Fax: [phone] Website: [email or web address]

## 2024-03-22 ENCOUNTER — Other Ambulatory Visit: Payer: Self-pay | Admitting: Family Medicine

## 2024-03-22 DIAGNOSIS — Z1231 Encounter for screening mammogram for malignant neoplasm of breast: Secondary | ICD-10-CM

## 2024-03-25 ENCOUNTER — Other Ambulatory Visit: Payer: Self-pay | Admitting: *Deleted

## 2024-03-25 NOTE — Patient Outreach (Signed)
 Care Management   Visit Note  03/25/2024 Name: Jennifer Moss MRN: [id number] DOB: 1951/06/04  Subjective: Jennifer Moss is a 73 y.o. year old female who is a primary care patient of Pollie Meyer Estevan Ryder, MD. The Care Management team was consulted for assistance.      Engaged with patient spoke with patient by telephone.    Goals Addressed             This Visit's Progress    COMPLETED: RNCM Care Management Expected Outcomes: Monitor, Self-Manage and Reduce Symptoms of: CKD       Current Barriers:  Knowledge Deficits related to plan of care for management of CKD Stage 4  Chronic Disease Management support and education needs related to CKD Stage 4   RNCM Clinical Goal(s):  Patient will verbalize basic understanding of  CKD Stage 4 disease process and self health management plan as evidenced by verbal explanation, recognizing symptoms, lifestyle modifications take all medications exactly as prescribed and will call provider for medication related questions as evidenced by compliance with all medications attend all scheduled medical appointments: with primary care provider and specialist as evidenced by keeping all scheduled appointments demonstrate Improved and Ongoing adherence to prescribed treatment plan for CKD Stage 4 as evidenced by consistent medication compliance, symptom monitoring, continued lifestyle modifications continue to work with RN Care Manager to address care management and care coordination needs related to  CKD Stage 4 as evidenced by adherence to CM Team Scheduled appointments through collaboration with RN Care manager, provider, and care team.   Interventions: Evaluation of current treatment plan related to  self management and patient's adherence to plan as established by provider    Chronic Kidney Disease Interventions:  (Status:  Condition stable.  Not addressed this visit.) Long Term Goal Evaluation of current treatment plan related to chronic kidney  disease self management and patient's adherence to plan as established by provider. Patient reports that she no longer has to start dialysis because her kidney function has improved and she will continue with the process for her transplant. Patient does well managing her CKD. Reflective listening and support provided. Provided education to patient re: stroke prevention, s/s of heart attack and stroke . Education and support provided.   Reviewed prescribed diet renal. Reviewed medications with patient and discussed importance of compliance. Reports compliance with all medications.    Counseled on adverse effects of illicit drug and excessive alcohol use in patients with chronic kidney disease. Denies any illicit drug or excessive alcohol use.   Counseled on the importance of exercise goals with target of 150 minutes per week.  Reports going to the gym 2-3 times per week. Advised patient, providing education and rationale, to monitor blood pressure daily and record, calling PCP for findings outside established parameters. Reports BP of 128/85 this morning. Discussed complications of poorly controlled blood pressure such as heart disease, stroke, circulatory complications, vision complications, kidney impairment, sexual dysfunction    Reviewed scheduled/upcoming provider appointments including: PCP  05-12-2024 Advised patient to discuss any new changes with provider    Discussed plans with patient for ongoing care management follow up and provided patient with direct contact information for care management team    Screening for signs and symptoms of depression related to chronic disease state      Discussed the impact of chronic kidney disease on daily life and mental health and acknowledged and normalized feelings of disempowerment, fear, and frustration    Assessed social determinant of health  barriers    Engage patient in early, proactive and ongoing discussion about goals of care and what matters most  to them    Support coping and stress management by recognizing current strategies and assist in developing new strategies such as mindfulness, journaling, relaxation techniques, problem-solving    Last practice recorded BP readings:  BP Readings from Last 3 Encounters:  11/17/23 (!) 133/97  07/06/23 (!) 133/90  05/21/23 107/82   Most recent eGFR/CrCl:  Lab Results  Component Value Date   EGFR 28 (L) 10/20/2022    No components found for: "CRCL"  Patient Goals/Self-Care Activities: Take all medications as prescribed Attend all scheduled provider appointments Call pharmacy for medication refills 3-7 days in advance of running out of medications Attend church or other social activities Perform all self care activities independently  Perform IADL's (shopping, preparing meals, housekeeping, managing finances) independently Call provider office for new concerns or questions   Follow Up Plan:  No further follow up required: Patient is stable at this time, no further outreach needed.           Consent to Services:  Patient was given information about care management services, agreed to services, and gave verbal consent to participate.   Plan: No further follow up required: Patient is stable at this time, no further outreach needed.  Danise Edge, BSN RN [facility], [facility] RN Care Manager Direct Dial: [phone]  Fax: [phone]

## 2024-03-25 NOTE — Patient Instructions (Signed)
 Visit Information  Thank you for taking time to visit with me today. Please don't hesitate to contact me if I can be of assistance to you before our next scheduled telephone appointment.  Following are the goals we discussed today:   Goals Addressed             This Visit's Progress    COMPLETED: RNCM Care Management Expected Outcomes: Monitor, Self-Manage and Reduce Symptoms of: CKD       Current Barriers:  Knowledge Deficits related to plan of care for management of CKD Stage 4  Chronic Disease Management support and education needs related to CKD Stage 4   RNCM Clinical Goal(s):  Patient will verbalize basic understanding of  CKD Stage 4 disease process and self health management plan as evidenced by verbal explanation, recognizing symptoms, lifestyle modifications take all medications exactly as prescribed and will call provider for medication related questions as evidenced by compliance with all medications attend all scheduled medical appointments: with primary care provider and specialist as evidenced by keeping all scheduled appointments demonstrate Improved and Ongoing adherence to prescribed treatment plan for CKD Stage 4 as evidenced by consistent medication compliance, symptom monitoring, continued lifestyle modifications continue to work with RN Care Manager to address care management and care coordination needs related to  CKD Stage 4 as evidenced by adherence to CM Team Scheduled appointments through collaboration with RN Care manager, provider, and care team.   Interventions: Evaluation of current treatment plan related to  self management and patient's adherence to plan as established by provider    Chronic Kidney Disease Interventions:  (Status:  Condition stable.  Not addressed this visit.) Long Term Goal Evaluation of current treatment plan related to chronic kidney disease self management and patient's adherence to plan as established by provider. Patient reports  that she no longer has to start dialysis because her kidney function has improved and she will continue with the process for her transplant. Patient does well managing her CKD. Reflective listening and support provided. Provided education to patient re: stroke prevention, s/s of heart attack and stroke . Education and support provided.   Reviewed prescribed diet renal. Reviewed medications with patient and discussed importance of compliance. Reports compliance with all medications.    Counseled on adverse effects of illicit drug and excessive alcohol use in patients with chronic kidney disease. Denies any illicit drug or excessive alcohol use.   Counseled on the importance of exercise goals with target of 150 minutes per week.  Reports going to the gym 2-3 times per week. Advised patient, providing education and rationale, to monitor blood pressure daily and record, calling PCP for findings outside established parameters. Reports BP of 128/85 this morning. Discussed complications of poorly controlled blood pressure such as heart disease, stroke, circulatory complications, vision complications, kidney impairment, sexual dysfunction    Reviewed scheduled/upcoming provider appointments including: PCP  05-12-2024 Advised patient to discuss any new changes with provider    Discussed plans with patient for ongoing care management follow up and provided patient with direct contact information for care management team    Screening for signs and symptoms of depression related to chronic disease state      Discussed the impact of chronic kidney disease on daily life and mental health and acknowledged and normalized feelings of disempowerment, fear, and frustration    Assessed social determinant of health barriers    Engage patient in early, proactive and ongoing discussion about goals of care and what matters most  to them    Support coping and stress management by recognizing current strategies and assist in  developing new strategies such as mindfulness, journaling, relaxation techniques, problem-solving    Last practice recorded BP readings:  BP Readings from Last 3 Encounters:  11/17/23 (!) 133/97  07/06/23 (!) 133/90  05/21/23 107/82   Most recent eGFR/CrCl:  Lab Results  Component Value Date   EGFR 28 (L) 10/20/2022    No components found for: "CRCL"  Patient Goals/Self-Care Activities: Take all medications as prescribed Attend all scheduled provider appointments Call pharmacy for medication refills 3-7 days in advance of running out of medications Attend church or other social activities Perform all self care activities independently  Perform IADL's (shopping, preparing meals, housekeeping, managing finances) independently Call provider office for new concerns or questions   Follow Up Plan:  No further follow up required: Patient is stable at this time, no further outreach needed.            Please call the care guide team at [phone] if you need to cancel or reschedule your appointment.   If you are experiencing a Mental Health or Behavioral Health Crisis or need someone to talk to, please call the Suicide and Crisis Lifeline: 988 call the [place] National Suicide Prevention Lifeline: (613)558-8999 or TTY: 947-079-6033 TTY 747-862-5183) to talk to a trained counselor call 1-800-273-TALK (toll free, 24 hour hotline) go to [facility] [address] [place] [phone])   Patient verbalizes understanding of instructions and care plan provided today and agrees to view in MyChart. Active MyChart status and patient understanding of how to access instructions and care plan via MyChart confirmed with patient.     No further follow up required: Patient is stable at this time, no further outreach needed.  Danise Edge, BSN RN [facility], [facility] Health RN Care Manager Direct Dial: [phone]  Fax:  [phone]

## 2024-04-11 ENCOUNTER — Ambulatory Visit
Admission: RE | Admit: 2024-04-11 | Discharge: 2024-04-11 | Disposition: A | Discharge status: Home / Self Care | Source: Ambulatory Visit | Attending: Family Medicine | Admitting: Family Medicine

## 2024-04-11 DIAGNOSIS — Z1231 Encounter for screening mammogram for malignant neoplasm of breast: Secondary | ICD-10-CM | POA: Diagnosis not present

## 2024-05-04 ENCOUNTER — Telehealth: Payer: Self-pay | Admitting: Family Medicine

## 2024-05-04 NOTE — Telephone Encounter (Signed)
 Received letter from [facility] saying patient is pending being listed for renal transplant but is in need of :  -colonoscopy or cologuard -pap smear -repeat drug screen  Attempted to call transplant coordinator RN listed on letter [employer], RN [phone]) to clarify expectations, since from my perspective patient has aged out of pap smears and is current on her colonoscopy.  Left voicemail with my cell phone # asking her to call me back.  Candee Cha, MD

## 2024-05-04 NOTE — Telephone Encounter (Signed)
 Transplant coordinator called back & I spoke with her Advised patient is not due for colonoscopy until July 2026 - will fax results to her  She advises that patient will need updated pap despite being >65 because of immunosuppressives involved with transplantation  Will plan for repeat pap and UDS at upcoming visit and I will route results to her via fax (fax # [phone])  Candee Cha, MD

## 2024-05-12 ENCOUNTER — Other Ambulatory Visit (HOSPITAL_COMMUNITY)
Admission: RE | Admit: 2024-05-12 | Discharge: 2024-05-12 | Disposition: A | Discharge status: Home / Self Care | Source: Ambulatory Visit | Attending: Family Medicine | Admitting: Family Medicine

## 2024-05-12 ENCOUNTER — Encounter: Payer: Self-pay | Admitting: Family Medicine

## 2024-05-12 ENCOUNTER — Ambulatory Visit (INDEPENDENT_AMBULATORY_CARE_PROVIDER_SITE_OTHER): Admitting: Family Medicine

## 2024-05-12 VITALS — BP 112/89 | HR 84 | Ht 60.0 in | Wt 142.0 lb

## 2024-05-12 DIAGNOSIS — Z0000 Encounter for general adult medical examination without abnormal findings: Secondary | ICD-10-CM

## 2024-05-12 DIAGNOSIS — Z780 Asymptomatic menopausal state: Secondary | ICD-10-CM | POA: Insufficient documentation

## 2024-05-12 DIAGNOSIS — J449 Chronic obstructive pulmonary disease, unspecified: Secondary | ICD-10-CM

## 2024-05-12 DIAGNOSIS — Z01818 Encounter for other preprocedural examination: Secondary | ICD-10-CM | POA: Diagnosis not present

## 2024-05-12 DIAGNOSIS — Z131 Encounter for screening for diabetes mellitus: Secondary | ICD-10-CM | POA: Diagnosis not present

## 2024-05-12 DIAGNOSIS — Z124 Encounter for screening for malignant neoplasm of cervix: Secondary | ICD-10-CM

## 2024-05-12 DIAGNOSIS — I10 Essential (primary) hypertension: Secondary | ICD-10-CM | POA: Diagnosis not present

## 2024-05-12 DIAGNOSIS — Z1151 Encounter for screening for human papillomavirus (HPV): Secondary | ICD-10-CM | POA: Insufficient documentation

## 2024-05-12 LAB — POCT GLYCOSYLATED HEMOGLOBIN (HGB A1C): HbA1c, POC (prediabetic range): 5.8 % (ref 5.7–6.4)

## 2024-05-12 NOTE — Patient Instructions (Signed)
 It was great to see you again today.  Today we did your pap smear and urine drug screen  Follow up in 6 months, sooner if needed  Be well, Dr. Dawn Eth  Health Maintenance, Female Adopting a healthy lifestyle and getting preventive care are important in promoting health and wellness. Ask your health care provider about: The right schedule for you to have regular tests and exams. Things you can do on your own to prevent diseases and keep yourself healthy. What should I know about diet, weight, and exercise? Eat a healthy diet  Eat a diet that includes plenty of vegetables, fruits, low-fat dairy products, and lean protein. Do not eat a lot of foods that are high in solid fats, added sugars, or sodium. Maintain a healthy weight Body mass index (BMI) is used to identify weight problems. It estimates body fat based on height and weight. Your health care provider can help determine your BMI and help you achieve or maintain a healthy weight. Get regular exercise Get regular exercise. This is one of the most important things you can do for your health. Most adults should: Exercise for at least 150 minutes each week. The exercise should increase your heart rate and make you sweat (moderate-intensity exercise). Do strengthening exercises at least twice a week. This is in addition to the moderate-intensity exercise. Spend less time sitting. Even light physical activity can be beneficial. Watch cholesterol and blood lipids Have your blood tested for lipids and cholesterol at 71 years of age, then have this test every 5 years. Have your cholesterol levels checked more often if: Your lipid or cholesterol levels are high. You are older than 71 years of age. You are at high risk for heart disease. What should I know about cancer screening? Depending on your health history and family history, you may need to have cancer screening at various ages. This may include screening for: Breast  cancer. Cervical cancer. Colorectal cancer. Skin cancer. Lung cancer. What should I know about heart disease, diabetes, and high blood pressure? Blood pressure and heart disease High blood pressure causes heart disease and increases the risk of stroke. This is more likely to develop in people who have high blood pressure readings or are overweight. Have your blood pressure checked: Every 3-5 years if you are 44-40 years of age. Every year if you are 63 years old or older. Diabetes Have regular diabetes screenings. This checks your fasting blood sugar level. Have the screening done: Once every three years after age 56 if you are at a normal weight and have a low risk for diabetes. More often and at a younger age if you are overweight or have a high risk for diabetes. What should I know about preventing infection? Hepatitis B If you have a higher risk for hepatitis B, you should be screened for this virus. Talk with your health care provider to find out if you are at risk for hepatitis B infection. Hepatitis C Testing is recommended for: Everyone born from [birth year] through 1963. Anyone with known risk factors for hepatitis C. Sexually transmitted infections (STIs) Get screened for STIs, including gonorrhea and chlamydia, if: You are sexually active and are younger than 71 years of age. You are older than 71 years of age and your health care provider tells you that you are at risk for this type of infection. Your sexual activity has changed since you were last screened, and you are at increased risk for chlamydia or gonorrhea. Ask your health  care provider if you are at risk. Ask your health care provider about whether you are at high risk for HIV. Your health care provider may recommend a prescription medicine to help prevent HIV infection. If you choose to take medicine to prevent HIV, you should first get tested for HIV. You should then be tested every 3 months for as long as you are taking  the medicine. Pregnancy If you are about to stop having your period (premenopausal) and you may become pregnant, seek counseling before you get pregnant. Take 400 to 800 micrograms (mcg) of folic acid  every day if you become pregnant. Ask for birth control (contraception) if you want to prevent pregnancy. Osteoporosis and menopause Osteoporosis is a disease in which the bones lose minerals and strength with aging. This can result in bone fractures. If you are 21 years old or older, or if you are at risk for osteoporosis and fractures, ask your health care provider if you should: Be screened for bone loss. Take a calcium  or vitamin D  supplement to lower your risk of fractures. Be given hormone replacement therapy (HRT) to treat symptoms of menopause. Follow these instructions at home: Alcohol use Do not drink alcohol if: Your health care provider tells you not to drink. You are pregnant, may be pregnant, or are planning to become pregnant. If you drink alcohol: Limit how much you have to: 0-1 drink a day. Know how much alcohol is in your drink. In the [place] one drink equals one 12 oz bottle of beer (355 mL), one 5 oz glass of wine (148 mL), or one 1 oz glass of hard liquor (44 mL). Lifestyle Do not use any products that contain nicotine or tobacco. These products include cigarettes, chewing tobacco, and vaping devices, such as e-cigarettes. If you need help quitting, ask your health care provider. Do not use street drugs. Do not share needles. Ask your health care provider for help if you need support or information about quitting drugs. General instructions Schedule regular health, dental, and eye exams. Stay current with your vaccines. Tell your health care provider if: You often feel depressed. You have ever been abused or do not feel safe at home. Summary Adopting a healthy lifestyle and getting preventive care are important in promoting health and wellness. Follow your health  care provider's instructions about healthy diet, exercising, and getting tested or screened for diseases. Follow your health care provider's instructions on monitoring your cholesterol and blood pressure. This information is not intended to replace advice given to you by your health care provider. Make sure you discuss any questions you have with your health care provider. Document Revised: 05/06/2021 Document Reviewed: 05/06/2021 Elsevier Patient Education  2024 [employer].

## 2024-05-12 NOTE — Progress Notes (Signed)
  Date of Visit: 05/12/2024   SUBJECTIVE:   HPI:  Jennifer Moss presents today for a well woman exam.   Overall she is doing well.  Needs a Pap smear and urine drug screen as part of her pretransplant evaluation.  Is being evaluated for renal transplant at [facility].  Hypertension: Currently taking amlodipine  10 mg daily.  Tolerating this well.  COPD: Taking Trelegy 1 puff daily.  This is doing well for her, needing albuterol  infrequently   OBJECTIVE:   BP 112/89   Pulse 84   Ht 5' (1.524 m)   Wt 142 lb (64.4 kg)   SpO2 100%   BMI 27.73 kg/m  Gen: NAD, pleasant, cooperative HEENT: NCAT, PERRL, no palpable thyromegaly or anterior cervical lymphadenopathy Heart: RRR, no murmurs Lungs: CTAB, NWOB Abdomen: soft, nontender to palpation Neuro: grossly nonfocal, speech normal GU: normal appearing external genitalia, 1cm labial cyst present on L labia majora, nontender to palpation (per patient has been present for years, not growing). Unable to tolerate speculum exam. Pap smear obtained blindly using single finger to guide swab towards cervix. Dayshia CMA present as chaperone.  ASSESSMENT/PLAN:   Assessment & Plan Routine adult health maintenance -pap smear: had aged out of paps, through now required to get another due to pretransplant eval and need for immunosuppresives should she be transplanted. Could not tolerate speculum exam today so pap gently obtained blindly -mammogram: UTD -lipid screening: utd -colon cancer screening: UTD, due 2026 -immunizations:  UTD -handout given on health maintenance topics Pre-transplant evaluation for kidney transplant Pap done today as required by transplant center UDS collected today as well Chronic obstructive pulmonary disease, unspecified COPD type (HCC) Well controlled. Continue current medication regimen.  Primary hypertension Well controlled. Continue current medication regimen.   FOLLOW UP: Follow up in 6 months for hypertension    [place] J. Dawn Eth, MD [facility]

## 2024-05-13 LAB — CYTOLOGY - PAP
Adequacy: ABSENT
Comment: NEGATIVE
Diagnosis: NEGATIVE
High risk HPV: NEGATIVE

## 2024-05-14 ENCOUNTER — Other Ambulatory Visit: Payer: Self-pay | Admitting: Family Medicine

## 2024-05-14 DIAGNOSIS — I10 Essential (primary) hypertension: Secondary | ICD-10-CM

## 2024-05-14 NOTE — Assessment & Plan Note (Signed)
 Well controlled. Continue current medication regimen.

## 2024-05-14 NOTE — Assessment & Plan Note (Signed)
-  pap smear: had aged out of paps, through now required to get another due to pretransplant eval and need for immunosuppresives should she be transplanted. Could not tolerate speculum exam today so pap gently obtained blindly -mammogram: UTD -lipid screening: utd -colon cancer screening: UTD, due 2026 -immunizations:  UTD -handout given on health maintenance topics

## 2024-05-18 ENCOUNTER — Other Ambulatory Visit: Payer: Self-pay | Admitting: Family Medicine

## 2024-05-20 ENCOUNTER — Ambulatory Visit: Payer: Self-pay | Admitting: Family Medicine

## 2024-05-20 DIAGNOSIS — R7303 Prediabetes: Secondary | ICD-10-CM | POA: Insufficient documentation

## 2024-05-20 LAB — TOXASSURE SELECT 13 (MW), URINE

## 2024-05-28 ENCOUNTER — Other Ambulatory Visit: Payer: Self-pay | Admitting: Family Medicine

## 2024-05-30 ENCOUNTER — Ambulatory Visit: Payer: 59

## 2024-05-30 VITALS — Ht 60.0 in | Wt 140.0 lb

## 2024-05-30 DIAGNOSIS — Z0000 Encounter for general adult medical examination without abnormal findings: Secondary | ICD-10-CM

## 2024-05-30 NOTE — Patient Instructions (Signed)
 Jennifer Moss , Thank you for taking time out of your busy schedule to complete your Annual Wellness Visit with me. I enjoyed our conversation and look forward to speaking with you again next year. I, as well as your care team,  appreciate your ongoing commitment to your health goals. Please review the following plan we discussed and let me know if I can assist you in the future. Your Game plan/ To Do List    Referrals: If you haven't heard from the office you've been referred to, please reach out to them at the phone provided.   Follow up Visits: Next Medicare AWV with our clinical staff: 06/01/2025 at 8:30 a.m. Phone Visit with Nurse Health Advisor   Have you seen your provider in the last 6 months (3 months if uncontrolled diabetes)? Yes Next Office Visit with your provider: Follow Up in 6 months or sooner if needed  Clinician Recommendations:  Aim for 30 minutes of exercise or brisk walking, 6-8 glasses of water , and 5 servings of fruits and vegetables each day.       This is a list of the screening recommended for you and due dates:  Health Maintenance  Topic Date Due   COVID-19 Vaccine (8 - Moderna risk 2024-25 season) 05/16/2024   Flu Shot  07/29/2024   Medicare Annual Wellness Visit  05/30/2025   Colon Cancer Screening  07/11/2025   Mammogram  04/11/2026   DTaP/Tdap/Td vaccine (3 - Td or Tdap) 01/08/2032   Pneumonia Vaccine  Completed   DEXA scan (bone density measurement)  Completed   Hepatitis C Screening  Completed   Zoster (Shingles) Vaccine  Completed   HPV Vaccine  Aged Out   Meningitis B Vaccine  Aged Out    Advanced directives: (Declined) Advance directive discussed with you today. Even though you declined this today, please call our office should you change your mind, and we can give you the proper paperwork for you to fill out. Advance Care Planning is important because it:  [x]  Makes sure you receive the medical care that is consistent with your values, goals, and  preferences  [x]  It provides guidance to your family and loved ones and reduces their decisional burden about whether or not they are making the right decisions based on your wishes.  Follow the link provided in your after visit summary or read over the paperwork we have mailed to you to help you started getting your Advance Directives in place. If you need assistance in completing these, please reach out to us  so that we can help you!  See attachments for Preventive Care and Fall Prevention Tips.

## 2024-05-30 NOTE — Progress Notes (Signed)
 Because this visit was a virtual/telehealth visit,  certain criteria was not obtained, such a blood pressure, CBG if applicable, and timed get up and go. Any medications not marked as "taking" were not mentioned during the medication reconciliation part of the visit. Any vitals not documented were not able to be obtained due to this being a telehealth visit or patient was unable to self-report a recent blood pressure reading due to a lack of equipment at home via telehealth. Vitals that have been documented are verbally provided by the patient.   Subjective:   Jennifer Moss is a 73 y.o. who presents for a Medicare Wellness preventive visit.  As a reminder, Annual Wellness Visits don't include a physical exam, and some assessments may be limited, especially if this visit is performed virtually. We may recommend an in-person follow-up visit with your provider if needed.  Visit Complete: Virtual I connected with  Jennifer Moss on 05/30/24 by a audio enabled telemedicine application and verified that I am speaking with the correct person using two identifiers.  Patient Location: Home  Provider Location: Office/Clinic  I discussed the limitations of evaluation and management by telemedicine. The patient expressed understanding and agreed to proceed.  Vital Signs: Because this visit was a virtual/telehealth visit, some criteria may be missing or patient reported. Any vitals not documented were not able to be obtained and vitals that have been documented are patient reported.  VideoDeclined- This patient declined Librarian, academic. Therefore the visit was completed with audio only.  Persons Participating in Visit: Patient.  AWV Questionnaire: No: Patient Medicare AWV questionnaire was not completed prior to this visit.  Cardiac Risk Factors include: advanced age (>78men, >67 women);hypertension;dyslipidemia;family history of premature cardiovascular disease      Objective:     Today's Vitals   05/30/24 0834  Weight: 140 lb (63.5 kg)  Height: 5' (1.524 m)  PainSc: 0-No pain   Body mass index is 27.34 kg/m.     05/30/2024    8:35 AM 04/30/2023    9:28 AM 04/16/2023    8:29 AM 10/14/2022   11:02 AM 06/09/2022    8:42 AM 04/24/2022    8:38 AM 03/18/2022    9:18 AM  Advanced Directives  Does Patient Have a Medical Advance Directive? No No No No No No No  Would patient like information on creating a medical advance directive? No - Patient declined No - Patient declined No - Patient declined    Yes (MAU/Ambulatory/Procedural Areas - Information given)    Current Medications (verified) Outpatient Encounter Medications as of 05/30/2024  Medication Sig   acetaminophen  (TYLENOL ) 500 MG tablet Take 1,000 mg by mouth every 6 (six) hours as needed for mild pain or headache. Take 1,000 mg by mouth every six to eight hours as needed for pain or headaches   albuterol  (VENTOLIN  HFA) 108 (90 Base) MCG/ACT inhaler Inhale 1-2 puffs into the lungs every 6 (six) hours as needed for wheezing or shortness of breath.   amLODipine  (NORVASC ) 10 MG tablet TAKE 1 TABLET BY MOUTH AT BEDTIME   aspirin  EC 81 MG EC tablet Take 1 tablet (81 mg total) by mouth daily. (Patient taking differently: Take 81 mg by mouth in the morning.)   atorvastatin  (LIPITOR) 10 MG tablet Take 1 tablet (10 mg total) by mouth daily.   Cholecalciferol  (VITAMIN D3 PO) Take 2,000 Int'l Units/day by mouth.   Cyanocobalamin (B-12) 1000 MCG CAPS Take 1,000 mcg by mouth daily.  fexofenadine (ALLEGRA) 180 MG tablet Take 180 mg by mouth daily.   fluticasone  (FLONASE ) 50 MCG/ACT nasal spray Place 2 sprays into both nostrils daily.   lactulose  (CHRONULAC ) 10 GM/15ML solution TAKE 15 ML  (10G TOTAL) BY MOUTH ONCE DAILY   Omega-3 Fatty Acids (FISH OIL PO) Take 1 capsule by mouth in the morning.    TRELEGY ELLIPTA  100-62.5-25 MCG/ACT AEPB Inhale 1 puff by mouth once daily   No facility-administered encounter  medications on file as of 05/30/2024.    Allergies (verified) Patient has no known allergies.   History: Past Medical History:  Diagnosis Date   Anginal pain (HCC)    Arthritis    Cataract    repaired.   Chronic kidney disease    rt kidney horseshoe   Diverticulitis    Drug abuse (HCC)    Hyperlipemia    Hypertension    Mallory-Weiss tear    Muscle spasms of both lower extremities    Rectal bleed    Retinal detachment    R eye, per patient   Shortness of breath    Past Surgical History:  Procedure Laterality Date   APPENDECTOMY     COLONOSCOPY N/A 04/11/2015   Procedure: COLONOSCOPY;  Surgeon: Alyce Jubilee, MD;  Location: [facility];  Service: Endoscopy;  Laterality: N/A;  8:30 AM   COLONOSCOPY  2013   DIAGNOSTIC LAPAROSCOPY     for horseshoe kidney   ESOPHAGOGASTRODUODENOSCOPY  12/17/2012   Procedure: ESOPHAGOGASTRODUODENOSCOPY (EGD);  Surgeon: Celedonio Coil, MD;  Location: [facility] ENDOSCOPY;  Service: Endoscopy;  Laterality: N/A;   Lipoma removal     in 1990s had an egg-sized tumor removed from her leg, she thinks it was a lipoma   repair of detached retina     TUBAL LIGATION     UPPER GASTROINTESTINAL ENDOSCOPY     Family History  Problem Relation Age of Onset   Hypertension Mother    Asthma Mother    Coronary artery disease Father    Heart attack Father    Asthma Father    Diabetes Sister    Drug abuse Sister    Stroke Sister    Drug abuse Sister    Heart disease Sister    Coronary artery disease Maternal Uncle    Stroke Maternal Grandmother    Coronary artery disease Maternal Grandmother    Diabetes Paternal Grandmother    Kidney disease Paternal Grandmother    Colon cancer Neg Hx    Breast cancer Neg Hx    Colon polyps Neg Hx    Esophageal cancer Neg Hx    Stomach cancer Neg Hx    Rectal cancer Neg Hx    Social History   Socioeconomic History   Marital status: Legally Separated    Spouse name: Not on file   Number of children: 1   Years of  education: 12   Highest education level: High school graduate  Occupational History   Occupation: Retired    Comment: Cook at [employer]   Tobacco Use   Smoking status: Never    Passive exposure: Never   Smokeless tobacco: Never  Vaping Use   Vaping status: Never Used  Substance and Sexual Activity   Alcohol use: Yes    Alcohol/week: 0.0 standard drinks of alcohol    Comment: occassional drink at holidays   Drug use: Yes    Frequency: 3.0 times per week    Types: Marijuana, Cocaine, "Crack" cocaine    Comment: cocaine and  THC + in UDS in November 2013. Last used 1 year ago as of 04/11/15   Sexual activity: Not Currently  Other Topics Concern   Not on file  Social History Narrative   Patient lives alone in [place].    Patient is legally separated.    Has one daughter and they are very close. "[employer]" for grandchildren.    Patient is retired and enjoying it.   Walks one mile everyday for exercise.    Patient is active with her church and has a small friend group she spends time with.      -Has history of smoking crack cocaine and is trying hard to stop. It's been hard because she takes the bus. When she feels the urge to smoke she prays, reads the Bible, or calls her sister for support. She does well if she stays active, but the times when she most wants to smoke crack is if she is bored at home. *Patient denies any recent use.*      -Also uses marijuana every few months. *Denies any recent use.*      Social Drivers of Corporate investment banker Strain: Low Risk  (05/30/2024)   Overall Financial Resource Strain (CARDIA)    Difficulty of Paying Living Expenses: Not hard at all  Food Insecurity: No Food Insecurity (05/30/2024)   Hunger Vital Sign    Worried About Running Out of Food in the Last Year: Never true    Ran Out of Food in the Last Year: Never true  Transportation Needs: No Transportation Needs (05/30/2024)   PRAPARE - Administrator, Civil Service (Medical):  No    Lack of Transportation (Non-Medical): No  Physical Activity: Sufficiently Active (05/30/2024)   Exercise Vital Sign    Days of Exercise per Week: 3 days    Minutes of Exercise per Session: 70 min  Stress: No Stress Concern Present (05/30/2024)   [employer] of Occupational Health - Occupational Stress Questionnaire    Feeling of Stress : Not at all  Social Connections: Moderately Isolated (05/30/2024)   Social Connection and Isolation Panel [NHANES]    Frequency of Communication with Friends and Family: More than three times a week    Frequency of Social Gatherings with Friends and Family: More than three times a week    Attends Religious Services: More than 4 times per year    Active Member of [employer] or Organizations: No    Attends Banker Meetings: Never    Marital Status: Separated    Tobacco Counseling Counseling given: Not Answered    Clinical Intake:  Pre-visit preparation completed: Yes  Pain : No/denies pain Pain Score: 0-No pain     BMI - recorded: 27.34 Nutritional Status: BMI 25 -29 Overweight Nutritional Risks: None Diabetes: No  Lab Results  Component Value Date   HGBA1C 5.8 05/12/2024   HGBA1C 5.7 (A) 11/03/2019   HGBA1C 5.6 08/28/2017     How often do you need to have someone help you when you read instructions, pamphlets, or other written materials from your doctor or pharmacy?: 1 - Never  Interpreter Needed?: No  Information entered by :: Lorrena Goranson N. Shubh Chiara, LPN.   Activities of Daily Living     05/30/2024    8:37 AM  In your present state of health, do you have any difficulty performing the following activities:  Hearing? 0  Vision? 0  Difficulty concentrating or making decisions? 0  Comment BSE: READING, GAMES ON  PHONE  Walking or climbing stairs? 0  Dressing or bathing? 0  Doing errands, shopping? 0  Preparing Food and eating ? N  Using the Toilet? N  In the past six months, have you accidently leaked urine? N  Do  you have problems with loss of bowel control? N  Managing your Medications? N  Managing your Finances? N  Housekeeping or managing your Housekeeping? N    Patient Care Team: Candee Cha, MD as PCP - General (Family Medicine) Lucendia Rusk, MD as PCP - Cardiology (Cardiology) Charletta Cons, MD as Attending Physician (Nephrology) Waylon Hahn, OD (Optometry) [place], Heather, OD as Consulting Physician (Optometry)  I have updated your Care Teams any recent Medical Services you may have received from other providers in the past year.     Assessment:    This is a routine wellness examination for Jennifer Moss.  Hearing/Vision screen Hearing Screening - Comments:: Denies hearing difficulties.  Vision Screening - Comments:: Wears reading glasses - up to date with routine eye exams with [facility]    Goals Addressed             This Visit's Progress    05/30/24: To stay healthy and independent.         Depression Screen     05/30/2024    8:38 AM 05/12/2024    8:31 AM 12/07/2023    2:14 PM 12/07/2023    2:13 PM 04/30/2023    9:28 AM 04/16/2023    8:27 AM 10/14/2022   11:01 AM  PHQ 2/9 Scores  PHQ - 2 Score 0 2 0 0 1 0 0  PHQ- 9 Score 0 2   4  0    Fall Risk     05/30/2024    8:36 AM 05/12/2024    8:22 AM 12/07/2023    2:01 PM 11/17/2023    9:14 AM 04/30/2023    9:29 AM  Fall Risk   Falls in the past year? 0 0 0 0 0  Number falls in past yr: 0 0 0 0 0  Injury with Fall? 0 0 0 0 0  Risk for fall due to : No Fall Risks      Follow up Falls evaluation completed        MEDICARE RISK AT HOME:  Medicare Risk at Home Any stairs in or around the home?: Yes If so, are there any without handrails?: No Home free of loose throw rugs in walkways, pet beds, electrical cords, etc?: Yes Adequate lighting in your home to reduce risk of falls?: Yes Life alert?: No Use of a cane, walker or w/c?: No Grab bars in the bathroom?: No Shower chair or bench in shower?:  No Elevated toilet seat or a handicapped toilet?: No  TIMED UP AND GO:  Was the test performed?  No  Cognitive Function: 6CIT completed    05/30/2024    8:39 AM  MMSE - Mini Mental State Exam  Not completed: Unable to complete        05/30/2024    8:35 AM 04/16/2023    8:29 AM 03/18/2022    9:20 AM 08/08/2019    9:48 AM  6CIT Screen  What Year? 0 points 0 points 0 points 0 points  What month? 0 points 0 points 0 points 0 points  What time? 0 points 0 points 0 points 0 points  Count back from 20 0 points 0 points 0 points 0 points  Months in reverse  0 points 0 points 0 points 2 points  Repeat phrase 0 points 0 points 0 points 0 points  Total Score 0 points 0 points 0 points 2 points    Immunizations Immunization History  Administered Date(s) Administered   Fluad Quad(high Dose 65+) 10/04/2020   Influenza,inj,Quad PF,6+ Mos 10/28/2019   Influenza-Unspecified 10/24/2021, 09/27/2022, 09/14/2023   Moderna Sars-Covid-2 Vaccination 02/10/2020, 03/09/2020, 11/07/2020, 05/28/2021, 09/27/2022   PNEUMOCOCCAL CONJUGATE-20 04/23/2021   PPD Test 09/23/2013, 09/27/2014   Pfizer Covid-19 Vaccine Bivalent Booster 65yrs & up 10/24/2021   Pfizer(Comirnaty)Fall Seasonal Vaccine 12 years and older 11/17/2023   Pneumococcal Polysaccharide-23 10/28/2019   RSV,unspecified 05/23/2023   Tdap 04/01/2013, 01/07/2022   Zoster Recombinant(Shingrix ) 07/09/2021, 01/07/2022    Screening Tests Health Maintenance  Topic Date Due   COVID-19 Vaccine (8 - Moderna risk 2024-25 season) 05/16/2024   INFLUENZA VACCINE  07/29/2024   Medicare Annual Wellness (AWV)  05/30/2025   Colonoscopy  07/11/2025   MAMMOGRAM  04/11/2026   DTaP/Tdap/Td (3 - Td or Tdap) 01/08/2032   Pneumonia Vaccine 13+ Years old  Completed   DEXA SCAN  Completed   Hepatitis C Screening  Completed   Zoster Vaccines- Shingrix   Completed   HPV VACCINES  Aged Out   Meningococcal B Vaccine  Aged Out    Health Maintenance  Health  Maintenance Due  Topic Date Due   COVID-19 Vaccine (8 - Moderna risk 2024-25 season) 05/16/2024   Health Maintenance Items Addressed: Yes  Patient aware of current care gaps.  Immunization record was verified by [employer]   Additional Screening:  Vision Screening: Recommended annual ophthalmology exams for early detection of glaucoma and other disorders of the eye. Would you like a referral to an eye doctor? No    Dental Screening: Recommended annual dental exams for proper oral hygiene  Community Resource Referral / Chronic Care Management: CRR required this visit?  Yes   CCM required this visit?  No   Plan:    I have personally reviewed and noted the following in the patient's chart:   Medical and social history Use of alcohol, tobacco or illicit drugs  Current medications and supplements including opioid prescriptions. Patient is not currently taking opioid prescriptions. Functional ability and status Nutritional status Physical activity Advanced directives List of other physicians Hospitalizations, surgeries, and ER visits in previous 12 months Vitals Screenings to include cognitive, depression, and falls Referrals and appointments  In addition, I have reviewed and discussed with patient certain preventive protocols, quality metrics, and best practice recommendations. A written personalized care plan for preventive services as well as general preventive health recommendations were provided to patient.   Margette Sheldon, LPN   03/30/3243   After Visit Summary: (MyChart) Due to this being a telephonic visit, the after visit summary with patients personalized plan was offered to patient via MyChart   Notes: Patient aware of current care gaps.  Immunization record was verified by [employer].

## 2024-06-13 DIAGNOSIS — I129 Hypertensive chronic kidney disease with stage 1 through stage 4 chronic kidney disease, or unspecified chronic kidney disease: Secondary | ICD-10-CM | POA: Diagnosis not present

## 2024-06-13 DIAGNOSIS — E559 Vitamin D deficiency, unspecified: Secondary | ICD-10-CM | POA: Diagnosis not present

## 2024-06-13 DIAGNOSIS — N2581 Secondary hyperparathyroidism of renal origin: Secondary | ICD-10-CM | POA: Diagnosis not present

## 2024-06-13 DIAGNOSIS — D631 Anemia in chronic kidney disease: Secondary | ICD-10-CM | POA: Diagnosis not present

## 2024-06-13 DIAGNOSIS — N184 Chronic kidney disease, stage 4 (severe): Secondary | ICD-10-CM | POA: Diagnosis not present

## 2024-07-09 ENCOUNTER — Other Ambulatory Visit: Payer: Self-pay | Admitting: Family Medicine

## 2024-07-18 DIAGNOSIS — N184 Chronic kidney disease, stage 4 (severe): Secondary | ICD-10-CM | POA: Diagnosis not present

## 2024-07-18 DIAGNOSIS — E559 Vitamin D deficiency, unspecified: Secondary | ICD-10-CM | POA: Diagnosis not present

## 2024-07-18 LAB — LAB REPORT - SCANNED
Albumin, Urine POC: 60.7
Creatinine, POC: 43.6 mg/dL
EGFR: 20
Microalb Creat Ratio: 139

## 2024-07-19 ENCOUNTER — Encounter: Payer: Self-pay | Admitting: Nurse Practitioner

## 2024-07-20 ENCOUNTER — Ambulatory Visit: Payer: Self-pay | Admitting: Family Medicine

## 2024-07-26 ENCOUNTER — Emergency Department (HOSPITAL_COMMUNITY)

## 2024-07-26 ENCOUNTER — Other Ambulatory Visit: Payer: Self-pay

## 2024-07-26 ENCOUNTER — Encounter (HOSPITAL_COMMUNITY): Payer: Self-pay | Admitting: Emergency Medicine

## 2024-07-26 ENCOUNTER — Observation Stay (HOSPITAL_BASED_OUTPATIENT_CLINIC_OR_DEPARTMENT_OTHER)

## 2024-07-26 ENCOUNTER — Observation Stay (HOSPITAL_COMMUNITY)
Admission: EM | Admit: 2024-07-26 | Discharge: 2024-07-27 | Disposition: A | Discharge status: Home / Self Care | Attending: Family Medicine | Admitting: Family Medicine

## 2024-07-26 DIAGNOSIS — S0993XA Unspecified injury of face, initial encounter: Secondary | ICD-10-CM | POA: Diagnosis not present

## 2024-07-26 DIAGNOSIS — Z87891 Personal history of nicotine dependence: Secondary | ICD-10-CM | POA: Diagnosis not present

## 2024-07-26 DIAGNOSIS — Z789 Other specified health status: Secondary | ICD-10-CM

## 2024-07-26 DIAGNOSIS — J449 Chronic obstructive pulmonary disease, unspecified: Secondary | ICD-10-CM | POA: Insufficient documentation

## 2024-07-26 DIAGNOSIS — I129 Hypertensive chronic kidney disease with stage 1 through stage 4 chronic kidney disease, or unspecified chronic kidney disease: Secondary | ICD-10-CM | POA: Insufficient documentation

## 2024-07-26 DIAGNOSIS — R55 Syncope and collapse: Secondary | ICD-10-CM

## 2024-07-26 DIAGNOSIS — F444 Conversion disorder with motor symptom or deficit: Secondary | ICD-10-CM | POA: Diagnosis not present

## 2024-07-26 DIAGNOSIS — Z79899 Other long term (current) drug therapy: Secondary | ICD-10-CM | POA: Insufficient documentation

## 2024-07-26 DIAGNOSIS — R220 Localized swelling, mass and lump, head: Secondary | ICD-10-CM | POA: Diagnosis not present

## 2024-07-26 DIAGNOSIS — Z7982 Long term (current) use of aspirin: Secondary | ICD-10-CM | POA: Insufficient documentation

## 2024-07-26 DIAGNOSIS — G8929 Other chronic pain: Secondary | ICD-10-CM | POA: Insufficient documentation

## 2024-07-26 DIAGNOSIS — R42 Dizziness and giddiness: Secondary | ICD-10-CM | POA: Diagnosis not present

## 2024-07-26 DIAGNOSIS — R479 Unspecified speech disturbances: Secondary | ICD-10-CM

## 2024-07-26 DIAGNOSIS — R6889 Other general symptoms and signs: Secondary | ICD-10-CM | POA: Diagnosis not present

## 2024-07-26 DIAGNOSIS — S0990XA Unspecified injury of head, initial encounter: Secondary | ICD-10-CM | POA: Diagnosis not present

## 2024-07-26 DIAGNOSIS — R4701 Aphasia: Secondary | ICD-10-CM | POA: Insufficient documentation

## 2024-07-26 DIAGNOSIS — N184 Chronic kidney disease, stage 4 (severe): Secondary | ICD-10-CM | POA: Insufficient documentation

## 2024-07-26 DIAGNOSIS — R9082 White matter disease, unspecified: Secondary | ICD-10-CM | POA: Diagnosis not present

## 2024-07-26 DIAGNOSIS — R404 Transient alteration of awareness: Secondary | ICD-10-CM | POA: Diagnosis not present

## 2024-07-26 HISTORY — DX: Other specified health status: Z78.9

## 2024-07-26 LAB — DIFFERENTIAL
Abs Immature Granulocytes: 0.02 K/uL (ref 0.00–0.07)
Basophils Absolute: 0.1 K/uL (ref 0.0–0.1)
Basophils Relative: 1 %
Eosinophils Absolute: 0.1 K/uL (ref 0.0–0.5)
Eosinophils Relative: 2 %
Immature Granulocytes: 0 %
Lymphocytes Relative: 26 %
Lymphs Abs: 1.9 K/uL (ref 0.7–4.0)
Monocytes Absolute: 0.5 K/uL (ref 0.1–1.0)
Monocytes Relative: 7 %
Neutro Abs: 4.7 K/uL (ref 1.7–7.7)
Neutrophils Relative %: 64 %

## 2024-07-26 LAB — URINALYSIS, ROUTINE W REFLEX MICROSCOPIC
Bilirubin Urine: NEGATIVE
Glucose, UA: NEGATIVE mg/dL
Ketones, ur: NEGATIVE mg/dL
Leukocytes,Ua: NEGATIVE
Nitrite: NEGATIVE
Protein, ur: NEGATIVE mg/dL
Specific Gravity, Urine: 1.008 (ref 1.005–1.030)
pH: 5 (ref 5.0–8.0)

## 2024-07-26 LAB — GLUCOSE, CAPILLARY: Glucose-Capillary: 115 mg/dL — ABNORMAL HIGH (ref 70–99)

## 2024-07-26 LAB — APTT: aPTT: 32 s (ref 24–36)

## 2024-07-26 LAB — ECHOCARDIOGRAM COMPLETE
Area-P 1/2: 2.76 cm2
S' Lateral: 3.1 cm
Weight: 2335.11 [oz_av]

## 2024-07-26 LAB — I-STAT CHEM 8, ED
BUN: 34 mg/dL — ABNORMAL HIGH (ref 8–23)
Calcium, Ion: 0.93 mmol/L — ABNORMAL LOW (ref 1.15–1.40)
Chloride: 114 mmol/L — ABNORMAL HIGH (ref 98–111)
Creatinine, Ser: 2.9 mg/dL — ABNORMAL HIGH (ref 0.44–1.00)
Glucose, Bld: 108 mg/dL — ABNORMAL HIGH (ref 70–99)
HCT: 42 % (ref 36.0–46.0)
Hemoglobin: 14.3 g/dL (ref 12.0–15.0)
Potassium: 4.2 mmol/L (ref 3.5–5.1)
Sodium: 140 mmol/L (ref 135–145)
TCO2: 19 mmol/L — ABNORMAL LOW (ref 22–32)

## 2024-07-26 LAB — COMPREHENSIVE METABOLIC PANEL WITH GFR
ALT: 16 U/L (ref 0–44)
AST: 18 U/L (ref 15–41)
Albumin: 3.9 g/dL (ref 3.5–5.0)
Alkaline Phosphatase: 89 U/L (ref 38–126)
Anion gap: 13 (ref 5–15)
BUN: 25 mg/dL — ABNORMAL HIGH (ref 8–23)
CO2: 17 mmol/L — ABNORMAL LOW (ref 22–32)
Calcium: 8.8 mg/dL — ABNORMAL LOW (ref 8.9–10.3)
Chloride: 108 mmol/L (ref 98–111)
Creatinine, Ser: 2.6 mg/dL — ABNORMAL HIGH (ref 0.44–1.00)
GFR, Estimated: 19 mL/min — ABNORMAL LOW (ref >60)
Glucose, Bld: 110 mg/dL — ABNORMAL HIGH (ref 70–99)
Potassium: 3.9 mmol/L (ref 3.5–5.1)
Sodium: 138 mmol/L (ref 135–145)
Total Bilirubin: <0.2 mg/dL (ref 0.0–1.2)
Total Protein: 6.5 g/dL (ref 6.5–8.1)

## 2024-07-26 LAB — CBC
HCT: 41.5 % (ref 36.0–46.0)
Hemoglobin: 13.1 g/dL (ref 12.0–15.0)
MCH: 26.9 pg (ref 26.0–34.0)
MCHC: 31.6 g/dL (ref 30.0–36.0)
MCV: 85.2 fL (ref 80.0–100.0)
Platelets: 388 K/uL (ref 150–400)
RBC: 4.87 MIL/uL (ref 3.87–5.11)
RDW: 14.1 % (ref 11.5–15.5)
WBC: 7.4 K/uL (ref 4.0–10.5)
nRBC: 0 % (ref 0.0–0.2)

## 2024-07-26 LAB — HEMOGLOBIN A1C
Hgb A1c MFr Bld: 6 % — ABNORMAL HIGH (ref 4.8–5.6)
Mean Plasma Glucose: 125.5 mg/dL

## 2024-07-26 LAB — PROTIME-INR
INR: 0.9 (ref 0.8–1.2)
Prothrombin Time: 12.9 s (ref 11.4–15.2)

## 2024-07-26 LAB — RAPID URINE DRUG SCREEN, HOSP PERFORMED
Amphetamines: NOT DETECTED
Barbiturates: NOT DETECTED
Benzodiazepines: NOT DETECTED
Cocaine: NOT DETECTED
Opiates: NOT DETECTED
Tetrahydrocannabinol: NOT DETECTED

## 2024-07-26 LAB — TROPONIN I (HIGH SENSITIVITY)
Troponin I (High Sensitivity): 5 ng/L
Troponin I (High Sensitivity): 6 ng/L (ref <18)

## 2024-07-26 LAB — HIV ANTIBODY (ROUTINE TESTING W REFLEX): HIV Screen 4th Generation wRfx: NONREACTIVE

## 2024-07-26 LAB — ETHANOL: Alcohol, Ethyl (B): <15 mg/dL (ref <15)

## 2024-07-26 MED ORDER — ENOXAPARIN SODIUM 30 MG/0.3ML IJ SOSY
30.0000 mg | PREFILLED_SYRINGE | INTRAMUSCULAR | Status: DC
  Administered 2024-07-26 – 2024-07-27 (×2): 30 mg via SUBCUTANEOUS
  Filled 2024-07-26 (×2): qty 0.3

## 2024-07-26 MED ORDER — ATORVASTATIN CALCIUM 10 MG PO TABS
10.0000 mg | ORAL_TABLET | Freq: Every day | ORAL | Status: DC
  Administered 2024-07-26 – 2024-07-27 (×2): 10 mg via ORAL
  Filled 2024-07-26 (×2): qty 1

## 2024-07-26 MED ORDER — BUDESON-GLYCOPYRROL-FORMOTEROL 160-9-4.8 MCG/ACT IN AERO
2.0000 | INHALATION_SPRAY | Freq: Two times a day (BID) | RESPIRATORY_TRACT | Status: DC
  Administered 2024-07-26 – 2024-07-27 (×2): 2 via RESPIRATORY_TRACT
  Filled 2024-07-26: qty 5.9

## 2024-07-26 MED ORDER — ALBUTEROL SULFATE (2.5 MG/3ML) 0.083% IN NEBU: 2.5000 mg | INHALATION_SOLUTION | Freq: Four times a day (QID) | RESPIRATORY_TRACT | Status: DC | PRN

## 2024-07-26 MED ORDER — ACETAMINOPHEN 325 MG PO TABS: 650.0000 mg | ORAL_TABLET | Freq: Four times a day (QID) | ORAL | Status: DC | PRN

## 2024-07-26 MED ORDER — ASPIRIN 81 MG PO TBEC
81.0000 mg | DELAYED_RELEASE_TABLET | Freq: Every day | ORAL | Status: DC
  Administered 2024-07-26 – 2024-07-27 (×2): 81 mg via ORAL
  Filled 2024-07-26 (×2): qty 1

## 2024-07-26 MED ORDER — SODIUM CHLORIDE 0.9% FLUSH: 3.0000 mL | Freq: Once | INTRAVENOUS | Status: DC

## 2024-07-26 MED ORDER — WHITE PETROLATUM EX OINT: TOPICAL_OINTMENT | CUTANEOUS | Status: DC | PRN

## 2024-07-26 NOTE — ED Triage Notes (Signed)
 Pt arrives from home w/ [facility]. Code stroke called in field. Pt was LKW 5:45 this AM when talking to her daughter on the phone. She called her daughter w/ c/o dizziness. Was speaking in full sentences when EMS arrived. Worsening aphasia. Upon arrival to ED pt unable to speak. Had syncopal episode Saturday.  140/70 60HR  98% RA CBG 117

## 2024-07-26 NOTE — Assessment & Plan Note (Addendum)
 Unclear etiology at this time. Highest on differential includes stress reaction vs TIA. Will admit for observation and TIA work up.  - Admit to med-tele, Attending: Dr. Rosalynn - Vitals per floor - Neurology evaluated, will re-consult if needed - A1C, lipid panel pending - Echo ordered - Continue home ASA 81 mg  - Consult for SLP, PT, OT placed - NPO until speech evaluation  - Holding amlodipine  ISO permissive HTN, consider restarting (HTN permissible to 180/100)

## 2024-07-26 NOTE — ED Notes (Signed)
 Phlebotomy asked to see patient for ordered labs.

## 2024-07-26 NOTE — Plan of Care (Signed)
 [facility]MTS Interim Progress Note  S: She was seen sitting up in bed during evening rounds.  She states her dizziness has improved from earlier, however, during this encounter patient begins to complain of dizziness again.  He states this comes and goes.  Patient continues to endorse difficulty with word finding.  Patient asks for something to put on the scab on her right cheek.  She has no other complaints at this time.  O: BP (!) 129/97 (BP Location: Right Arm)   Pulse 70   Temp 98.2 F (36.8 C) (Oral)   Resp 18   Wt 66.2 kg   SpO2 99%   BMI 28.50 kg/m   General: Awake, alert, NAD Cardiovascular: RRR, no M/R/G Respiratory: CTAB, normal work of breathing on room air Abdomen: Flat, soft, nontender to palpation, bowel sounds present Neuro: Patient initially speaking in full sentences, however during encounter begins to have difficulty with word finding  A/P: Dizziness Orthostatics negative. - Continue management per day team  Right cheek wound Present on admission.  Bruising and scabbing. - Vaseline per patient request - Tylenol  650 mg every 6 hours as needed for pain   Lennie Raguel MATSU, DO 07/26/2024, 9:35 PM PGY-1, [facility] pager [phone]

## 2024-07-26 NOTE — ED Notes (Signed)
 Nt called CCMD@10 :41Am

## 2024-07-26 NOTE — ED Notes (Signed)
 Unable to perform orthostatics on pt due to pt becoming extremely dizzy with an position change.

## 2024-07-26 NOTE — ED Provider Notes (Signed)
 [place] [facility] AT [facility] Provider Note   CSN: [id number] Arrival date & time: 07/26/24  9341     Patient presents with: Code Stroke   Jennifer Moss is a 73 y.o. female.   Patient here as a code stroke.  Last known normal was 545 this morning when talking to the daughter on the phone.  She called her daughter with chief complaint of dizziness.  She speaking in full sentences when EMS arrived but then had some difficulty with speech.  She had some sort of syncopal episode a few days ago.  She suffered some bruising to the right side of her face.  But did not seek medical attention.  She has chronic kidney disease on transplant list.  Former substance abuse history.  She has a history of hypertension high cholesterol.  She denies any stress, drug use, fever, chills.  She has been dealing with some dizziness.  She denies any chest pain shortness of breath.  She feels safe at home.  The history is provided by the patient.       Prior to Admission medications   Medication Sig Start Date End Date Taking? Authorizing Provider  acetaminophen  (TYLENOL ) 500 MG tablet Take 1,000 mg by mouth every 6 (six) hours as needed for mild pain or headache. Take 1,000 mg by mouth every six to eight hours as needed for pain or headaches    [provider]  albuterol  (VENTOLIN  HFA) 108 (90 Base) MCG/ACT inhaler Inhale 1-2 puffs into the lungs every 6 (six) hours as needed for wheezing or shortness of breath. 04/30/23   [name], MD  amLODipine  (NORVASC ) 10 MG tablet TAKE 1 TABLET BY MOUTH AT BEDTIME 05/16/24   [name], MD  aspirin  EC 81 MG EC tablet Take 1 tablet (81 mg total) by mouth daily. Patient taking differently: Take 81 mg by mouth in the morning. 01/19/13   Billy Dallas GAILS, MD  atorvastatin  (LIPITOR) 10 MG tablet Take 1 tablet by mouth once daily 05/30/24   McIntyre, Brittany J, MD  Cholecalciferol  (VITAMIN D3 PO) Take 2,000 Int'l  Units/day by mouth.    [provider]  Cyanocobalamin (B-12) 1000 MCG CAPS Take 1,000 mcg by mouth daily. 05/21/21   [name], MD  fexofenadine (ALLEGRA) 180 MG tablet Take 180 mg by mouth daily.    [provider]  fluticasone  (FLONASE ) 50 MCG/ACT nasal spray Place 2 sprays into both nostrils daily. 04/30/23   [name], MD  lactulose  (CHRONULAC ) 10 GM/15ML solution TAKE 15 ML  (10G TOTAL) BY MOUTH ONCE DAILY 05/18/24   McIntyre, Brittany J, MD  Omega-3 Fatty Acids (FISH OIL PO) Take 1 capsule by mouth in the morning.     [provider]  TRELEGY ELLIPTA  100-62.5-25 MCG/ACT AEPB Inhale 1 puff by mouth once daily 07/11/24   [name], MD    Allergies: Patient has no known allergies.    Review of Systems  Updated Vital Signs Wt 66.2 kg   BMI 28.50 kg/m   Physical Exam Vitals and nursing note reviewed.  Constitutional:      General: She is not in acute distress.    Appearance: She is well-developed.  HENT:     Head: Normocephalic and atraumatic.     Nose: Nose normal.     Mouth/Throat:     Mouth: Mucous membranes are moist.  Eyes:     Extraocular Movements: Extraocular movements intact.  Conjunctiva/sclera: Conjunctivae normal.     Pupils: Pupils are equal, round, and reactive to light.  Cardiovascular:     Rate and Rhythm: Normal rate and regular rhythm.     Heart sounds: No murmur heard. Pulmonary:     Effort: Pulmonary effort is normal. No respiratory distress.     Breath sounds: Normal breath sounds.  Abdominal:     Palpations: Abdomen is soft.     Tenderness: There is no abdominal tenderness.  Musculoskeletal:        General: No swelling.     Cervical back: Neck supple.  Skin:    General: Skin is warm and dry.     Capillary Refill: Capillary refill takes less than 2 seconds.     Findings: Bruising present.     Comments: Bruising around her right eye  Neurological:     General: No focal deficit present.      Mental Status: She is alert.     Cranial Nerves: No cranial nerve deficit.     Sensory: No sensory deficit.     Motor: No weakness.     Coordination: Coordination normal.     Comments: Her neurological exam overall appears to be normal.  She has 5+ out of 5 strength throughout, normal sensation.  She does have some difficulty with speech at times but at times she is very fluent and then at times she feels like she cannot get her words out, visual fields are unremarkable, strength sensations intact cranial nerves intact  Psychiatric:        Mood and Affect: Mood normal.     (all labs ordered are listed, but only abnormal results are displayed) Labs Reviewed  COMPREHENSIVE METABOLIC PANEL WITH GFR - Abnormal; Notable for the following components:      Result Value   CO2 17 (*)    Glucose, Bld 110 (*)    BUN 25 (*)    Creatinine, Ser 2.60 (*)    Calcium  8.8 (*)    GFR, Estimated 19 (*)    All other components within normal limits  GLUCOSE, CAPILLARY - Abnormal; Notable for the following components:   Glucose-Capillary 115 (*)    All other components within normal limits  URINALYSIS, ROUTINE W REFLEX MICROSCOPIC - Abnormal; Notable for the following components:   APPearance HAZY (*)    Hgb urine dipstick SMALL (*)    Bacteria, UA FEW (*)    All other components within normal limits  I-STAT CHEM 8, ED - Abnormal; Notable for the following components:   Chloride 114 (*)    BUN 34 (*)    Creatinine, Ser 2.90 (*)    Glucose, Bld 108 (*)    Calcium , Ion 0.93 (*)    TCO2 19 (*)    All other components within normal limits  PROTIME-INR  APTT  CBC  DIFFERENTIAL  ETHANOL  CBG MONITORING, ED    EKG: EKG Interpretation Date/Time:  Tuesday July 26 2024 07:59:59 EDT Ventricular Rate:  67 PR Interval:  140 QRS Duration:  90 QT Interval:  407 QTC Calculation: 430 R Axis:   -17  Text Interpretation: Sinus tachycardia Confirmed by Ruthe Cornet [phone]) on 07/26/2024 8:28:08  AM  Radiology: MR ANGIO HEAD WO CONTRAST Result Date: 07/26/2024 CLINICAL DATA:  73 year old female code stroke presentation. Progressive aphasia; inability to speak. EXAM: MRA HEAD WITHOUT CONTRAST TECHNIQUE: Angiographic images of the Circle of Willis were acquired using MRA technique without intravenous contrast. COMPARISON:  Brain MRI today reported separately.  FINDINGS: Anterior circulation: Antegrade flow in both ICA siphons. Mildly tortuous ICAs, more so the right. And ectasia of the cervical right ICA at the skull base, up to 7-8 mm diameter there. No ICA siphon stenosis. Patent carotid termini. Normal ophthalmic and right posterior communicating artery origins. Normal MCA and ACA origins. Right ACA A1 appears to be fenestrated (normal variant series 351, image 2). Visible bilateral ACA branches are within normal limits. MCA M1 segments and bifurcations appear patent without stenosis. Visible bilateral MCA branches are within normal limits. Posterior circulation: Integrate flow in the posterior circulation with dominant distal left vertebral artery. Patent vertebrobasilar junction, left PICA, dominant appearing right AICA origins. Patent basilar artery with tortuosity, no stenosis. Patent SCA and PCA origins. Posterior communicating arteries are diminutive or absent. Bilateral PCA branches are within normal limits. Anatomic variants: Dominant left vertebral V4 segment. Fenestrated right ACA A1 segment. Other: MRI today reported separately. IMPRESSION: 1. Negative for large vessel occlusion. Generalized arterial tortuosity, including ectatic Right ICA. No intracranial artery stenosis identified. 2. Brain MRI today reported separately. Electronically Signed   By: VEAR Hurst M.D.   On: 07/26/2024 08:16   MR BRAIN WO CONTRAST Result Date: 07/26/2024 CLINICAL DATA:  73 year old female code stroke presentation. Progressive aphasia; inability to speak. EXAM: MRI HEAD WITHOUT CONTRAST TECHNIQUE: Multiplanar,  multiecho pulse sequences of the brain and surrounding structures were obtained without intravenous contrast. COMPARISON:  Plain head CT 0657 hours today.  Brain MRI 04/27/2021. FINDINGS: Brain: No restricted diffusion to suggest acute infarction. No midline shift, mass effect, evidence of mass lesion, ventriculomegaly, extra-axial collection or acute intracranial hemorrhage. Cervicomedullary junction and pituitary are within normal limits. Widely scattered cerebral white matter T2 and FLAIR hyperintensity in both hemispheres (series 5, image 20), not significantly changed from 2022 MRI. Extensive chronic T2 and FLAIR heterogeneity also in the pons. Configuration of these findings is nonspecific. No cortical encephalomalacia or chronic cerebral blood products identified. Mild T2 heterogeneity in the deep gray nuclei. Negative cerebellum. Vascular: Major intracranial vascular flow voids are stable. Skull and upper cervical spine: Negative. Visualized bone marrow signal is within normal limits. Sinuses/Orbits: Chronic postoperative changes to both globes. Paranasal sinuses and mastoids are stable and well aerated. Other: Grossly negative visible internal auditory structures. Negative visible scalp and face. IMPRESSION: 1. No acute intracranial abnormality. 2. Advanced chronic signal changes in the cerebral white matter and pons, not significantly changed since 2022, and favored to be chronic small vessel disease related. Electronically Signed   By: VEAR Hurst M.D.   On: 07/26/2024 07:52   CT HEAD CODE STROKE WO CONTRAST Result Date: 07/26/2024 CLINICAL DATA:  Code stroke.  73 year old female. EXAM: CT HEAD WITHOUT CONTRAST TECHNIQUE: Contiguous axial images were obtained from the base of the skull through the vertex without intravenous contrast. RADIATION DOSE REDUCTION: This exam was performed according to the departmental dose-optimization program which includes automated exposure control, adjustment of the mA  and/or kV according to patient size and/or use of iterative reconstruction technique. COMPARISON:  Brain MRI 04/27/2021. FINDINGS: Brain: Cerebral volume not significantly changed since 2022. No midline shift, ventriculomegaly, mass effect, evidence of mass lesion, intracranial hemorrhage or evidence of cortically based acute infarction. Patchy and scattered bilateral cerebral white matter hypodensity is chronic, appears similar to MRI findings previously. Deep white matter capsule involvement, deep gray nuclei relatively spared. Vascular: No suspicious intracranial vascular hyperdensity. Calcified atherosclerosis at the skull base. Skull: Intact.  No acute osseous abnormality identified. Sinuses/Orbits: Visualized paranasal sinuses and  mastoids are stable and well aerated. Other: Postoperative changes to both globes. Visualized scalp soft tissues are within normal limits. ASPECTS [facility] Stroke Program Early CT Score) Total score (0-10 with 10 being normal): 10 IMPRESSION: 1. No acute cortically based infarct or acute intracranial hemorrhage identified. ASPECTS 10. 2. Moderate for age cerebral white matter disease appears similar to 2022 MRI. 3. These results were communicated to Dr. Michaela at 7:01 am on 07/26/2024 by text page via the [facility] messaging system. Electronically Signed   By: VEAR Hurst M.D.   On: 07/26/2024 07:01     Procedures   Medications Ordered in the ED  sodium chloride  flush (NS) 0.9 % injection 3 mL (3 mLs Intravenous Not Given 07/26/24 0808)                                    Medical Decision Making Amount and/or Complexity of Data Reviewed Labs: ordered. Radiology: ordered.   Jennifer Moss is here with strokelike symptoms.  Last known normal just prior to arrival.  She woke up feeling dizzy difficulty with speech.  She had a syncopal episode on Saturday where she suffered some trauma to the right side of her face.  When I have evaluated her this is after she is ready been  seen by neurology had CT scans and MRIs which were unremarkable.  Neurology states that her speech comes and goes.  The not sure if it some sort of functional process.  But she is complaining of dizziness.  She lives by herself.  When I talked to her more about her syncopal episode the other day this sounds like an unprovoked event.  She remembers feeling fine and the next thing she knew she was on the floor.  She has history of chronic kidney disease, drug abuse.  She is on transplant list.  She does not undergo dialysis.  She has been compliant with her medications.  In the room with me she still is having difficulty with speech.  Not sure if this is functional process/stress related process but she denies any stress concerns about her living situation.  Lab work is overall unremarkable.  No evidence of urinary tract infection.  Her creatinine is at baseline.  She still feels somewhat dizzy.  I have a low suspicion for seizure but I do think given her recent syncope her ongoing symptoms I think it be reasonable to admit her for further cardiac workup echocardiogram telemetry may be EEG.  I have added a troponin.  Have gotten a CT scan of her face to evaluate for any traumatic processes in the face.  But overall I think she would benefit from observation stay to further workup syncope, look at her medications, make sure she has full return to baseline.  This chart was dictated using voice recognition software.  Despite best efforts to proofread,  errors can occur which can change the documentation meaning.      Final diagnoses:  Syncope, unspecified syncope type  Difficulty with speech    ED Discharge Orders     None          Ruthe Cornet, DO 07/26/24 [phone]

## 2024-07-26 NOTE — H&P (Addendum)
 Hospital Admission History and Physical Service Pager: [phone]  Patient name: Jennifer Moss Medical record number: [id number] Date of Birth: October 29, 1951 Age: 73 y.o. Gender: female  Primary Care Provider: [name], MD Consultants: Neurology  Code Status: Ful lCode Preferred Emergency Contact:  Contact Information     Name Relation Home Work Mobile   [place],Jennifer Moss Daughter [phone]        Other Contacts   None on File      Chief Complaint: dizziness and difficulty speaking   Differential and Medical Decision Making:  Jennifer Moss is a 73 y.o. female presenting with dizziness and difficulty speaking.  Differential for this patient's presentation of this includes stress reaction, TIA, stroke, post-concussive event, less likely seizure, metabolic disturbance, sepsis. MRI and CT head negative for acute intracranial changes. Patients neurological exam is largely normal with the exception of some stuttering and difficulty finding certain words. Stroke less likely given no acute neurological findings and negative imaging. Patient without seizure-like activity, making this less likely. CBC/CMP largely normal with the exception of chronically elevated Cr ISO CKD. Vital signs stable without source of infection, making sepsis less likely. Suspect symptoms likely stress related ISO recently being added to kidney transplant list vs TIA. Will admit patient to observation and TIA rule out.   Assessment & Plan Dizziness Unclear etiology at this time. Highest on differential includes stress reaction vs TIA. Will admit for observation and TIA work up.  - Admit to med-tele, Attending: Dr. Rosalynn - Vitals per floor - Neurology evaluated, will re-consult if needed - A1C, lipid panel pending - Echo ordered - Continue home ASA 81 mg  - Consult for SLP, PT, OT placed - NPO until speech evaluation  - Holding amlodipine  ISO permissive HTN, consider restarting (HTN permissible  to 180/100)  Chronic health problem HTN: holding amlopidine 10 mg ISO permissive HTN COPD: continue trelegy 1 puff daily, albuterol  PRN HLD: continue lipitor 10 mg CKD: renally adjust medications prn    FEN/GI: NPO pending speech evaluation VTE Prophylaxis: Lovenox , adjusted for CKD  Disposition: Med-Tele   History of Present Illness:  Jennifer Moss is a 73 y.o. female presenting with dizziness and difficulty with speech.  Patient reports that on Saturday she was visiting with her daughter when she woke up to her daughter putting ice on her face.  Daughter informed patient that she had passed out and perhaps vomited.  Patient does not recall getting up or going outside where she was found on the floor.  Patient reports that she had some swelling and bruising to her right eye afterwards but otherwise felt okay.  Patient does not remember the incident.  Afterwards, she stayed with her daughter but was feeling okay so decided to go home the next day.  On Sunday night, patient had 1 episode of dizziness after going up to go to the bathroom.  She denies any changes in vision, changes in hearing, headaches, or palpitations.  She reports the episode as the room spinning.  On Monday, she felt fine during the day.  Last night, had 4 episodes of similar dizziness.  2 of those episodes were upon going to the restroom.  However she had a few more episodes while she was just sitting in the bed.  Describes these episodes as a room spinning.  Reports she was going to take herself to urgent care but after the fourth episode decided she should call EMS.  Upon arrival, EMS described patient with  difficulty finding words and stuttering so called code stroke.  Patient reports remembers the entire event, does not report any other associated symptoms.  Patient denies any chest pain, palpitations, headaches, vision changes, new onset swelling or pain to extremities.  Of note patient reports she was recently added to the  kidney transplant list for her chronic kidney disease.  She is now undergoing extensive workup and has many appointment scheduled.  Spoke to daughter, Jennifer Moss, regarding events on Saturday. She notes mom was feeling sleepy earlier that day and she laid down. She was sleeping when daughter saw her. When she looked again, she noticed she had fallen inside the house. Daughter isnt sure what happened but maybe thinks mom hit the dog cage next to sofa. Daughter thinks patient was not 100% there but was saying my face, my face. Daughter changed her since her eyebrow opened and had one episode of vomiting that looked like juice. She put her on the couch and patient was talking and then went back to sleep for 45 minutes. After she woke up, patient was asking what happened. Patient ate and stayed up for a while and then was back to normal. Denies any difficulty with speech. Daughter reports history of patient complaining of headaches so she wasn't alarmed when patient described dizzy spell. When she talked to daughter this AM, mom was having a hard time getting words out. Daughter reports patient more stressed due to recently being added to kidney transplant. Daughter reports patient also having headaches and has not seen PCP to have this evaluated.   In the ED, code stroke was activated.  CT head, MR head, MR angio without large vessel occlusion or other acute intracranial abnormalities.  Lab work overall unremarkable.  Creatinine appearing at baseline.  UA with few bacteria, although sample did contain many squamous cells.  EKG within normal limits.  Neurology consulted and do not believe this is stroke.  Review Of Systems: Per HPI  Pertinent Past Medical History: Chronic kidney disease Hyperlipidemia Hypertension Arthritis COPD Horseshoe kidney  Pertinent Past Surgical History: Appendectomy Tubal ligation Repair of retinal detachment  Remainder reviewed in history tab.  Pertinent Social  History: Tobacco use: Former Alcohol use: Not currently Other Substance use: Former cocaine use Lives alone  Pertinent Family History: Mother: Hypertension Father: Heart attack, CAD Sister: Heart disease   Important Outpatient Medications: Norvasc  10 mg nightly ASA 81 mg daily Lipitor 10 mg daily Trelegy Ellipta  1 puff  Vit D  Vit B12    Objective: BP (!) 128/95   Pulse 64   Temp 98.2 F (36.8 C)   Resp 14   Wt 66.2 kg   SpO2 97%   BMI 28.50 kg/m  Exam: General: tearful on exam, NAD HEENT: Atraumatic, normocephalic, PERRLA Cardiovascular: Regular rate and rhythm, no murmurs rubs or gallops Respiratory: CTAB, NWOB Gastrointestinal: Soft, nontender nondistended MSK: No lower extremity swelling noted Derm: Bruising with associated swelling to right eye, 1 inch laceration below right eyebrow Neuro: PERRLA, patient with blindness in right eye. No visual field deficits to left side. No nystagmus noted. Sensation normal throughout.  Normal palatal elevation. 5 out of 5 strength throughout.  Some difficulty with stuttering and frustration with getting words out periodically. Normal finger to nose. No tremor noted.  Psych: anxious   Labs:  CBC BMET  Recent Labs  Lab 07/26/24 0659 07/26/24 0702  WBC 7.4  --   HGB 13.1 14.3  HCT 41.5 42.0  PLT 388  --  Recent Labs  Lab 07/26/24 0659 07/26/24 0702  NA 138 140  K 3.9 4.2  CL 108 114*  CO2 17*  --   BUN 25* 34*  CREATININE 2.60* 2.90*  GLUCOSE 110* 108*  CALCIUM  8.8*  --      Drugs of Abuse     Component Value Date/Time   LABOPIA NONE DETECTED 07/26/2024 0844   COCAINSCRNUR NONE DETECTED 07/26/2024 0844   LABBENZ NONE DETECTED 07/26/2024 0844   AMPHETMU NONE DETECTED 07/26/2024 0844   THCU NONE DETECTED 07/26/2024 0844   LABBARB NONE DETECTED 07/26/2024 0844     Urinalysis    Component Value Date/Time   COLORURINE YELLOW 07/26/2024 0735   APPEARANCEUR HAZY (A) 07/26/2024 0735   LABSPEC 1.008  07/26/2024 0735   PHURINE 5.0 07/26/2024 0735   GLUCOSEU NEGATIVE 07/26/2024 0735   HGBUR SMALL (A) 07/26/2024 0735   BILIRUBINUR NEGATIVE 07/26/2024 0735   BILIRUBINUR NEGATIVE 12/05/2013 0945   KETONESUR NEGATIVE 07/26/2024 0735   PROTEINUR NEGATIVE 07/26/2024 0735   UROBILINOGEN 0.2 12/24/2014 0007   NITRITE NEGATIVE 07/26/2024 0735   LEUKOCYTESUR NEGATIVE 07/26/2024 0735   Ethanol: <15 APTT: 32 seconds  EKG: My own interpretation (not copied from electronic read)  Normal sinus rhythm, artifact noted on lead II   Imaging Studies Performed: CT head: 1. No acute cortically based infarct or acute intracranial hemorrhage identified. ASPECTS 10. 2. Moderate for age cerebral white matter disease appears similar to 2022 MRI. 3. These results were communicated to Dr. Michaela at 7:01 am on 07/26/2024 by text page via the [facility] messaging system.  MRI Brain: 1. No acute intracranial abnormality. 2. Advanced chronic signal changes in the cerebral white matter and pons, not significantly changed since 2022, and favored to be chronic small vessel disease related.  MR Angio Head: 1. Negative for large vessel occlusion. Generalized arterial tortuosity, including ectatic Right ICA. No intracranial artery stenosis identified.  CT Maxillofacial:  1. Right periorbital soft tissue swelling.   CXR: No acute cardiopulmonary disease   Tramaine Snell, MD 07/26/2024, 10:59 AM PGY-2, [facility]  FPTS Intern pager: [phone], text pages welcome Secure chat group [facility] Teaching Service

## 2024-07-26 NOTE — Consult Note (Signed)
 NEUROLOGY CONSULT NOTE   Date of service: July 26, 2024 Patient Name: Jennifer Moss MRN:  [id number] DOB:  12/17/1951 Chief Complaint: [place] to speak Requesting Provider: Ruthe Cornet, DO  History of Present Illness  Jennifer Moss is a 73 y.o. female with hx of CKD, hyperlipidemia, hypertension, rectal bleeding who presents with difficulty speaking.  She called EMS, and on their arrival she was able to speak some but rapidly stopped talking.  She apparently had an syncopal episode a few days ago.  She is able to indicate very readily answers to questions with head shakes and gestures.  At times, when she gets frustrated, she is able to speak and at those times she is fluent.  Due to her inability to speak a code stroke was activated.  She indicates with head shakes that she was not normal when she first woke up, but has been feeling off at least since yesterday.  LKW: Unclear, likely yesterday Modified rankin score: 0-Completely asymptomatic and back to baseline post- stroke IV Thrombolysis: No, unclear time of onset EVT: No, no LVO  NIHSS components Score: Comment  1a Level of Conscious 0[x]  1[]  2[]  3[]      1b LOC Questions 0[]  1[]  2[x]       1c LOC Commands 0[x]  1[]  2[]       2 Best Gaze 0[x]  1[]  2[]       3 Visual 0[x]  1[]  2[]  3[]      4 Facial Palsy 0[x]  1[]  2[]  3[]      5a Motor Arm - left 0[x]  1[]  2[]  3[]  4[]  UN[]    5b Motor Arm - Right 0[x]  1[]  2[]  3[]  4[]  UN[]    6a Motor Leg - Left 0[x]  1[]  2[]  3[]  4[]  UN[]    6b Motor Leg - Right 0[x]  1[]  2[]  3[]  4[]  UN[]    7 Limb Ataxia 0[x]  1[]  2[]  UN[]      8 Sensory 0[x]  1[]  2[]  UN[]      9 Best Language 0[]  1[]  2[x]  3[]      10 Dysarthria 0[x]  1[]  2[]  UN[]      11 Extinct. and Inattention 0[x]  1[]  2[]       TOTAL: 4        Past History   Past Medical History:  Diagnosis Date   Anginal pain (HCC)    Arthritis    Cataract    repaired.   Chronic kidney disease    rt kidney horseshoe   Diverticulitis    Drug abuse (HCC)     Hyperlipemia    Hypertension    Mallory-Weiss tear    Muscle spasms of both lower extremities    Rectal bleed    Retinal detachment    R eye, per patient   Shortness of breath     Past Surgical History:  Procedure Laterality Date   APPENDECTOMY     COLONOSCOPY N/A 04/11/2015   Procedure: COLONOSCOPY;  Surgeon: Margo LITTIE Haddock, MD;  Location: [facility];  Service: Endoscopy;  Laterality: N/A;  8:30 AM   COLONOSCOPY  2013   DIAGNOSTIC LAPAROSCOPY     for horseshoe kidney   ESOPHAGOGASTRODUODENOSCOPY  12/17/2012   Procedure: ESOPHAGOGASTRODUODENOSCOPY (EGD);  Surgeon: Lesta JULIANNA Fitz, MD;  Location: [facility] ENDOSCOPY;  Service: Endoscopy;  Laterality: N/A;   Lipoma removal     in 1990s had an egg-sized tumor removed from her leg, she thinks it was a lipoma   repair of detached retina     TUBAL LIGATION     UPPER GASTROINTESTINAL ENDOSCOPY  Family History: Family History  Problem Relation Age of Onset   Hypertension Mother    Asthma Mother    Coronary artery disease Father    Heart attack Father    Asthma Father    Diabetes Sister    Drug abuse Sister    Stroke Sister    Drug abuse Sister    Heart disease Sister    Coronary artery disease Maternal Uncle    Stroke Maternal Grandmother    Coronary artery disease Maternal Grandmother    Diabetes Paternal Grandmother    Kidney disease Paternal Grandmother    Colon cancer Neg Hx    Breast cancer Neg Hx    Colon polyps Neg Hx    Esophageal cancer Neg Hx    Stomach cancer Neg Hx    Rectal cancer Neg Hx     Social History  reports that she has never smoked. She has never been exposed to tobacco smoke. She has never used smokeless tobacco. She reports current alcohol use. She reports current drug use. Frequency: 3.00 times per week. Drugs: Marijuana, Cocaine, and Crack cocaine.  No Known Allergies  Medications   Current Facility-Administered Medications:    sodium chloride  flush (NS) 0.9 % injection 3 mL, 3 mL,  Intravenous, Once, Curatolo, Adam, DO  Current Outpatient Medications:    acetaminophen  (TYLENOL ) 500 MG tablet, Take 1,000 mg by mouth every 6 (six) hours as needed for mild pain or headache. Take 1,000 mg by mouth every six to eight hours as needed for pain or headaches, Disp: , Rfl:    albuterol  (VENTOLIN  HFA) 108 (90 Base) MCG/ACT inhaler, Inhale 1-2 puffs into the lungs every 6 (six) hours as needed for wheezing or shortness of breath., Disp: 18 g, Rfl: 0   amLODipine  (NORVASC ) 10 MG tablet, TAKE 1 TABLET BY MOUTH AT BEDTIME, Disp: 90 tablet, Rfl: 3   aspirin  EC 81 MG EC tablet, Take 1 tablet (81 mg total) by mouth daily. (Patient taking differently: Take 81 mg by mouth in the morning.), Disp: 30 tablet, Rfl: 11   atorvastatin  (LIPITOR) 10 MG tablet, Take 1 tablet by mouth once daily, Disp: 90 tablet, Rfl: 3   Cholecalciferol  (VITAMIN D3 PO), Take 2,000 Int'l Units/day by mouth., Disp: , Rfl:    Cyanocobalamin (B-12) 1000 MCG CAPS, Take 1,000 mcg by mouth daily., Disp: 90 capsule, Rfl: 1   fexofenadine (ALLEGRA) 180 MG tablet, Take 180 mg by mouth daily., Disp: , Rfl:    fluticasone  (FLONASE ) 50 MCG/ACT nasal spray, Place 2 sprays into both nostrils daily., Disp: 16 g, Rfl: 6   lactulose  (CHRONULAC ) 10 GM/15ML solution, TAKE 15 ML  (10G TOTAL) BY MOUTH ONCE DAILY, Disp: 473 mL, Rfl: 3   Omega-3 Fatty Acids (FISH OIL PO), Take 1 capsule by mouth in the morning. , Disp: , Rfl:    TRELEGY ELLIPTA  100-62.5-25 MCG/ACT AEPB, Inhale 1 puff by mouth once daily, Disp: 60 each, Rfl: 3  Vitals   Vitals:   07/26/24 0600  Weight: 66.2 kg    Body mass index is 28.5 kg/m.   Physical Exam   Constitutional: Appears well-developed and well-nourished.   Neurologic Examination    Neuro: Mental Status: Patient is awake, alert, she is unable to answer questions, but at times she does speak with clear speech such as when I ask about how she was feeling this morning and she eventually answers with  multiple words.  She does have some stuttering while she is speaking. Cranial Nerves:  II: Visual Fields are full. Pupils are equal, round, and reactive to light.   III,IV, VI: EOMI without ptosis or diploplia.  V: Facial sensation is symmetric to temperature VII: Facial movement is symmetric.  VIII: hearing is intact to voice X: Uvula elevates symmetrically XII: tongue is midline without atrophy or fasciculations.  Motor: Tone is normal. Bulk is normal. 5/5 strength was present in all four extremities.  Sensory: Sensation is symmetric to light touch and temperature in the arms and legs. Cerebellar: FNF intact bilaterally    Labs/Imaging/Neurodiagnostic studies   CBC:  Recent Labs  Lab 08-02-2024 0659 08-02-2024 0702  WBC 7.4  --   NEUTROABS 4.7  --   HGB 13.1 14.3  HCT 41.5 42.0  MCV 85.2  --   PLT 388  --    Basic Metabolic Panel:  Lab Results  Component Value Date   NA 140 Aug 02, 2024   K 4.2 Aug 02, 2024   CO2 18 (L) 10/20/2022   GLUCOSE 108 (H) 08-02-2024   BUN 34 (H) 2024-08-02   CREATININE 2.90 (H) Aug 02, 2024   CALCIUM  9.6 10/20/2022   GFRNONAA 36 (L) 11/07/2021   GFRAA 36 (L) 05/15/2020   Lipid Panel:  Lab Results  Component Value Date   LDLCALC 51 11/17/2023   HgbA1c:  Lab Results  Component Value Date   HGBA1C 5.8 05/12/2024   Urine Drug Screen:     Component Value Date/Time   LABOPIA NONE DETECTED 01/18/2013 1423   COCAINSCRNUR POSITIVE (A) 01/18/2013 1423   LABBENZ NONE DETECTED 01/18/2013 1423   AMPHETMU NONE DETECTED 01/18/2013 1423   THCU NONE DETECTED 01/18/2013 1423   LABBARB NONE DETECTED 01/18/2013 1423    Alcohol Level No results found for: [facility] INR  Lab Results  Component Value Date   INR 0.9 08/02/2024   APTT  Lab Results  Component Value Date   APTT 32 08-02-2024    CT Head without contrast(Personally reviewed): Negative  MR Angio head without contrast (Personally reviewed): No LVO  MRI Brain(Personally reviewed): No  acute infarct  ASSESSMENT   Jennifer Moss is a 73 y.o. female with a history of hypertension, hyperlipidemia, CKD who presents with inability to verbalize. Aphemia is an unusual stroke symptoms, and she is readily able to communicate in other ways.  My strong suspicion at this point is that this represents conversion disorder as opposed to organic disease.  In any case, she is outside the window for IV TNK and I do not see an LVO that would serve as a target for thrombectomy.  RECOMMENDATIONS  Follow-up MRI read If confirmed is negative, no further testing needed acutely, but neurology will be available should her clinical status change. ______________________________________________________________________    Signed, Aisha Seals, MD [facility]

## 2024-07-26 NOTE — Assessment & Plan Note (Addendum)
 HTN: holding amlopidine 10 mg ISO permissive HTN COPD: continue trelegy 1 puff daily, albuterol  PRN HLD: continue lipitor 10 mg CKD: renally adjust medications prn

## 2024-07-26 NOTE — ED Notes (Signed)
 Pt rang to use restroom. Attempted to ambulate pt, upon standing up and 2 steps, pt became  very light head she said. We sat back into the bed. I grab a bedside commode. Pt stood and turned without any assistance. Asked how she was felling and said she was not so lightheaded anymore. Pt voided x1, and back in bed resting at the moment.

## 2024-07-26 NOTE — Progress Notes (Signed)
  Echocardiogram 2D Echocardiogram has been performed.  Tinnie FORBES Gosling RDCS 07/26/2024, 1:26 PM

## 2024-07-26 NOTE — Hospital Course (Addendum)
 Jennifer Moss is a 73 y.o.female with a history of CKD IV, HTN,  COPD who was admitted to the [facility] Medicine Teaching Service at [facility] for dizziness and speech difficulty. Her hospital course is detailed below:  Dizziness/Speech Difficulty Patient was admitted for observation after having multiple episodes of dizziness overnight on 7/28.  Patient was initially seen in the ED for stroke rule out.  CT and MRI negative.  EKG was normal.  Patient had some difficulty finding words and stuttering, however no other focal neurological deficits were noted.  Patient was observed overnight.  Echocardiogram showed LVEF 55-60%, Grade I diastolic dysfunction.  Patient continued to do well without any further neurological deficits. Infectious work up was negative. Suspect symptoms likely related to stress from recent health changes. Patient was stable at discharge. ***   Other chronic conditions were medically managed with home medications and formulary alternatives as necessary (***)  PCP Follow-up Recommendations:

## 2024-07-26 NOTE — Progress Notes (Signed)
 Code Stroke cancelled at 0725 per Dr Michaela.

## 2024-07-26 NOTE — Evaluation (Signed)
 Physical Therapy Evaluation Patient Details Name: Jennifer Moss MRN: [id number] DOB: Feb 17, 1951 Today's Date: 07/26/2024  History of Present Illness  Pt is 73 yo female who presents on 07/26/24 with dizziness and difficulty with speech. CT and MRI neg for acute abnormality. PMH: HTN, HLD, COPD, CKD  Clinical Impression  Pt admitted with above diagnosis. Pt from home alone in 2nd story apt, drives, active, independent. Pt was visiting her daughter on Saturday when she passed out when getting up from couch watching tv and woke to daughter holding ice on her face. She denies dizziness or speech deficits until today when she called 911. On eval she is dizzy with changes in position, orthostatics neg, and has fluctuating expressive aphasia. She is understandably emotional about not being able to get her words out. She is mildly unsteady with ambulation but no overt LOB.  Will follow acutely and currently recommending f/u with outpt PT. Pt currently with functional limitations due to the deficits listed below (see PT Problem List). Pt will benefit from acute skilled PT to increase their independence and safety with mobility to allow discharge.     BP supine 117/91  HR 68 bpm Sitting 122/99  73 bpm Standing 127/88  69 bpm      If plan is discharge home, recommend the following: A little help with walking and/or transfers;A little help with bathing/dressing/bathroom;Assistance with cooking/housework;Assist for transportation;Help with stairs or ramp for entrance   Can travel by private vehicle        Equipment Recommendations None recommended by PT  Recommendations for Other Services       Functional Status Assessment Patient has had a recent decline in their functional status and demonstrates the ability to make significant improvements in function in a reasonable and predictable amount of time.     Precautions / Restrictions Precautions Precautions: Fall Recall of Precautions/Restrictions:  Intact Precaution/Restrictions Comments: passed out on 7/26 hitting R eye (hematoma) Restrictions Weight Bearing Restrictions Per Provider Order: No      Mobility  Bed Mobility Overal bed mobility: Modified Independent                  Transfers Overall transfer level: Needs assistance   Transfers: Sit to/from Stand Sit to Stand: Contact guard assist           General transfer comment: mildly unsteady and had increased dizziness with changes in position, -orthhostatics    Ambulation/Gait Ambulation/Gait assistance: Min assist Gait Distance (Feet): 20 Feet Assistive device: 1 person hand held assist Gait Pattern/deviations: Step-through pattern Gait velocity: decreased Gait velocity interpretation: >2.62 ft/sec, indicative of community ambulatory   General Gait Details: HHA for stability though seemed more steady as she kept going  Acupuncturist Bed    Modified Rankin (Stroke Patients Only) Modified Rankin (Stroke Patients Only) Pre-Morbid Rankin Score: No symptoms Modified Rankin: Moderate disability     Balance Overall balance assessment: Needs assistance Sitting-balance support: No upper extremity supported, Feet supported Sitting balance-Leahy Scale: Good Sitting balance - Comments: had increased dizziness from sup to sit but no LOB   Standing balance support: No upper extremity supported Standing balance-Leahy Scale: Fair Standing balance comment: maintained balance during perineal care and pulling up pants                             Pertinent Vitals/Pain  Pain Assessment Pain Assessment: No/denies pain    Home Living Family/patient expects to be discharged to:: Private residence Living Arrangements: Alone Available Help at Discharge: Family;Available PRN/intermittently Type of Home: Apartment Home Access: Stairs to enter   Entrance Stairs-Number of Steps: 2 flights   Home Layout: One  level Home Equipment: None Additional Comments: pt can stay with daughter if needed but daughter works    Prior Function Prior Level of Function : Independent/Modified Independent;Driving             Mobility Comments: retired from Investment banker, corporate at [employer]. Independent and active. ADLs Comments: independent     Extremity/Trunk Assessment   Upper Extremity Assessment Upper Extremity Assessment: Overall WFL for tasks assessed    Lower Extremity Assessment Lower Extremity Assessment: Overall WFL for tasks assessed    Cervical / Trunk Assessment Cervical / Trunk Assessment: Normal  Communication   Communication Communication: Impaired Factors Affecting Communication: Difficulty expressing self;Reduced clarity of speech    Cognition Arousal: Alert Behavior During Therapy:  (appropriately emotional because of difficulty with speech)   PT - Cognitive impairments: No apparent impairments                       PT - Cognition Comments: is describing things with her hands when she can't get the words out Following commands: Intact       Cueing Cueing Techniques: Verbal cues     General Comments General comments (skin integrity, edema, etc.): BP supine 117/91 HR 68 bpm, sitting 122/99 HR 73 bpm, standing 127/88 HR 69 bpm. SPO2 in 90's on RA    Exercises     Assessment/Plan    PT Assessment Patient needs continued PT services  PT Problem List Decreased mobility;Decreased balance;Decreased activity tolerance       PT Treatment Interventions Gait training;Stair training;Functional mobility training;Therapeutic activities;Therapeutic exercise;Balance training;Neuromuscular re-education;Patient/family education    PT Goals (Current goals can be found in the Care Plan section)  Acute Rehab PT Goals Patient Stated Goal: get better PT Goal Formulation: With patient Time For Goal Achievement: 08/09/24 Potential to Achieve Goals: Good    Frequency Min 2X/week      Co-evaluation               AM-PAC PT 6 Clicks Mobility  Outcome Measure Help needed turning from your back to your side while in a flat bed without using bedrails?: None Help needed moving from lying on your back to sitting on the side of a flat bed without using bedrails?: None Help needed moving to and from a bed to a chair (including a wheelchair)?: None Help needed standing up from a chair using your arms (e.g., wheelchair or bedside chair)?: A Little Help needed to walk in hospital room?: A Little Help needed climbing 3-5 steps with a railing? : A Little 6 Click Score: 21    End of Session Equipment Utilized During Treatment: Gait belt Activity Tolerance: Patient tolerated treatment well Patient left: in bed;with call bell/phone within reach Nurse Communication: Mobility status PT Visit Diagnosis: Difficulty in walking, not elsewhere classified (R26.2);Dizziness and giddiness (R42);History of falling (Z91.81)    Time: 8486-8449 PT Time Calculation (min) (ACUTE ONLY): 37 min   Charges:   PT Evaluation $PT Eval Moderate Complexity: 1 Mod PT Treatments $Gait Training: 8-22 mins PT General Charges $$ ACUTE PT VISIT: 1 Visit         Richerd Lipoma, PT  Acute Rehab Services Secure chat preferred Office  [phone]   Richerd L Tahira Olivarez 07/26/2024, 4:48 PM

## 2024-07-27 DIAGNOSIS — R29818 Other symptoms and signs involving the nervous system: Secondary | ICD-10-CM | POA: Diagnosis not present

## 2024-07-27 DIAGNOSIS — R55 Syncope and collapse: Secondary | ICD-10-CM | POA: Diagnosis not present

## 2024-07-27 DIAGNOSIS — R42 Dizziness and giddiness: Secondary | ICD-10-CM | POA: Diagnosis not present

## 2024-07-27 LAB — LIPID PANEL
Cholesterol: 140 mg/dL (ref 0–200)
HDL: 71 mg/dL (ref >40)
LDL Cholesterol: 54 mg/dL (ref 0–99)
Total CHOL/HDL Ratio: 2 ratio
Triglycerides: 76 mg/dL (ref <150)
VLDL: 15 mg/dL (ref 0–40)

## 2024-07-27 LAB — BASIC METABOLIC PANEL WITH GFR
Anion gap: 7 (ref 5–15)
BUN: 32 mg/dL — ABNORMAL HIGH (ref 8–23)
CO2: 22 mmol/L (ref 22–32)
Calcium: 9 mg/dL (ref 8.9–10.3)
Chloride: 112 mmol/L — ABNORMAL HIGH (ref 98–111)
Creatinine, Ser: 2.67 mg/dL — ABNORMAL HIGH (ref 0.44–1.00)
GFR, Estimated: 19 mL/min — ABNORMAL LOW (ref >60)
Glucose, Bld: 103 mg/dL — ABNORMAL HIGH (ref 70–99)
Potassium: 4.9 mmol/L (ref 3.5–5.1)
Sodium: 141 mmol/L (ref 135–145)

## 2024-07-27 MED ORDER — ACETAMINOPHEN 325 MG PO TABS: 650.0000 mg | ORAL_TABLET | Freq: Four times a day (QID) | ORAL | Status: AC | PRN

## 2024-07-27 MED ORDER — ORAL CARE MOUTH RINSE: 15.0000 mL | OROMUCOSAL | Status: DC | PRN

## 2024-07-27 NOTE — Discharge Summary (Cosign Needed Addendum)
 Family Medicine Teaching [facility] Discharge Summary  Patient name: Jennifer Moss Medical record number: [id number] Date of birth: Nov 13, 1951 Age: 73 y.o. Gender: female Date of Admission: 07/26/2024  Date of Discharge: 07/27/24 Admitting Physician: Winn Ogren, MD  Primary Care Provider: [name], MD Consultants: Neurology, SLP  Indication for Hospitalization: Stroke rule out  Discharge Diagnoses/Problem List:  Principal Problem for Admission: Dizziness, aphemia Other Problems addressed during stay:  Principal Problem:   Dizziness Active Problems:   Chronic health problem   Syncope   Aphemia  Brief Hospital Course:  Jennifer Moss is a 73 y.o.female with a history of CKD IV, HTN,  COPD who was admitted to the [facility] Medicine Teaching Service at [facility] for dizziness and speech difficulty. Her hospital course is detailed below:  Dizziness/Speech Difficulty Patient was admitted for observation after having multiple episodes of dizziness overnight on 7/28.  Patient was initially seen in the ED for stroke rule out.  CT and MRI negative.  EKG was normal.  Patient had some difficulty finding words and stuttering, however no other focal neurological deficits were noted.  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  Patient was observed overnight.  Echocardiogram showed LVEF 55-60%, Grade I diastolic dysfunction.  Patient continued to do well without any further neurological deficits. Infectious work up was negative. Suspect symptoms likely related to stress from recent health changes.  SLP saw patient and recommended outpatient speech therapy, referral was placed at discharge.  Patient was at neurological baseline except for aphemia at discharge.   Other chronic conditions were medically managed with home medications and formulary alternatives as necessary (hypertension, COPD, HLD, CKD)  PCP Follow-up Recommendations: Consider starting anxiolytic/SSRI  longer term Consider starting oral iron given low ferritin Ambulatory referral to speech therapy placed at discharge, ensure it follows through and patient is scheduled  Results/Tests Pending at Time of Discharge:  Unresulted Labs (From admission, onward)    None      Disposition: Home, independent  Discharge Condition: Stable, continues to have word finding challenges.  Discharge Exam:  Vitals:   07/27/24 0800 07/27/24 1225  BP: 113/82 (!) 141/94  Pulse: 67 73  Resp: 19 19  Temp: 99 F (37.2 C) 98.2 F (36.8 C)  SpO2: 100% 97%   General: Awake, alert, NAD. Communication slowly with challenges wordfinding Cardio: RRR. Normal S1, S2. No murmur, rub, gallop.  Resp: CTA bilaterally. No wheezes, rales, or rhonchi. Normal work of breathing on room air Neuro: Alert and oriented x 4.  Cranial nerves II through XII are intact.  Coordination with finger-to-nose is intact bilaterally.  Moves all extremities, strength 5 out of 5 in bilateral upper and lower extremities.  Sensation intact and symmetrical bilaterally.  Significant Procedures: None  Significant Labs and Imaging:  Recent Labs  Lab 07/26/24 0659 07/26/24 0702  WBC 7.4  --   HGB 13.1 14.3  HCT 41.5 42.0  PLT 388  --    Recent Labs  Lab 07/26/24 0659 07/26/24 0702 07/27/24 0335  NA 138 140 141  K 3.9 4.2 4.9  CL 108 114* 112*  CO2 17*  --  22  GLUCOSE 110* 108* 103*  BUN 25* 34* 32*  CREATININE 2.60* 2.90* 2.67*  CALCIUM  8.8*  --  9.0  ALKPHOS 89  --   --   AST 18  --   --   ALT 16  --   --   ALBUMIN 3.9  --   --  Trop 5,6  LDL 54, Tgs 76, A1c 6.0  CT head: 1. No acute cortically based infarct or acute intracranial hemorrhage identified. ASPECTS 10. 2. Moderate for age cerebral white matter disease appears similar to 2022 MRI. 3. These results were communicated to Dr. Michaela at 7:01 am on 07/26/2024 by text page via the [facility] messaging system.   MRI Brain: 1. No acute intracranial  abnormality. 2. Advanced chronic signal changes in the cerebral white matter and pons, not significantly changed since 2022, and favored to be chronic small vessel disease related.   MR Angio Head: 1. Negative for large vessel occlusion. Generalized arterial tortuosity, including ectatic Right ICA. No intracranial artery stenosis identified.   CT Maxillofacial:  1. Right periorbital soft tissue swelling.    CXR: No acute cardiopulmonary disease    Echo:   1. Technically difficult because of foreshortenned images. Left  ventricular ejection fraction, by estimation, is 55 to 60%. The left  ventricle has normal function. Left ventricular endocardial border not  optimally defined to evaluate regional wall  motion. Left ventricular diastolic parameters are consistent with Grade I  diastolic dysfunction (impaired relaxation).   2. Right ventricular systolic function is normal. The right ventricular  size is normal.   3. The mitral valve is normal in structure. No evidence of mitral valve  regurgitation. No evidence of mitral stenosis.   4. The aortic valve is tricuspid. Aortic valve regurgitation is not  visualized. No aortic stenosis is present.   5. The inferior vena cava is normal in size with greater than 50%  respiratory variability, suggesting right atrial pressure of 3 mmHg.     Discharge Medications:  Allergies as of 07/27/2024       Reactions   Nsaids Other (See Comments)   Contraindication due to CKD         Medication List     STOP taking these medications    amLODipine  10 MG tablet Commonly known as: NORVASC        TAKE these medications    acetaminophen  325 MG tablet Commonly known as: TYLENOL  Take 2 tablets (650 mg total) by mouth every 6 (six) hours as needed for mild pain (pain score 1-3). What changed:  medication strength how much to take reasons to take this additional instructions   albuterol  108 (90 Base) MCG/ACT inhaler Commonly known  as: VENTOLIN  HFA Inhale 1-2 puffs into the lungs every 6 (six) hours as needed for wheezing or shortness of breath.   aspirin  EC 81 MG tablet Take 1 tablet (81 mg total) by mouth daily. What changed: when to take this   atorvastatin  10 MG tablet Commonly known as: LIPITOR Take 1 tablet by mouth once daily   B-12 1000 MCG Caps Take 1,000 mcg by mouth daily.   fexofenadine 180 MG tablet Commonly known as: ALLEGRA Take 180 mg by mouth daily as needed for allergies or rhinitis.   FISH OIL PO Take 1 capsule by mouth in the morning.   fluticasone  50 MCG/ACT nasal spray Commonly known as: FLONASE  Place 2 sprays into both nostrils daily.   lactulose  10 GM/15ML solution Commonly known as: CHRONULAC  TAKE 15 ML  (10G TOTAL) BY MOUTH ONCE DAILY What changed: See the new instructions.   Trelegy Ellipta  100-62.5-25 MCG/ACT Aepb Generic drug: Fluticasone -Umeclidin-Vilant Inhale 1 puff by mouth once daily What changed: See the new instructions.   VITAMIN D3 PO Take 2,000 Int'l Units/day by mouth daily at 12 noon.  Discharge Instructions: Please refer to Patient Instructions section of EMR for full details.  Patient was counseled important signs and symptoms that should prompt return to medical care, changes in medications, dietary instructions, activity restrictions, and follow up appointments.   Follow-Up Appointments:  Follow-up Information     [facility] at [facility]. Schedule an appointment as soon as possible for a visit.   Specialty: Rehabilitation Contact information: [address] [place]  [address] [phone]        Department of Social Services Follow up.   Why: Call to schedule transportation through your medicaid 2-3 days in advance. Contact information: [phone]                Manon Jester, DO 07/27/2024, 6:30 PM PGY-1, [place] [facility]   FMTS Upper-Level Resident  Attestation I have independently interviewed and examined the patient. I have discussed the above with Dr. Manon and agree with the documented plan. My edits for correction/addition/clarification are included above. Please see any attending notes.  Fumi Guadron Toma, MD PGY-2, [place] [facility] 07/27/2024 8:38 PM FPTS Service pager: [phone] (text pages welcome through [employer])

## 2024-07-27 NOTE — Evaluation (Signed)
 Speech Language Pathology Evaluation Patient Details Name: AKILAH CURETON MRN: [id number] DOB: Aug 16, 1951 Today's Date: 07/27/2024 Time: 8967-8947 SLP Time Calculation (min) (ACUTE ONLY): 20 min  Problem List:  Patient Active Problem List   Diagnosis Date Noted   Dizziness 07/26/2024   Chronic health problem 07/26/2024   Syncope 07/26/2024   Prediabetes 05/20/2024   COPD (chronic obstructive pulmonary disease) (HCC) 11/19/2023   Routine adult health maintenance 11/19/2023   Allergic rhinitis 04/30/2023   Vitamin D  deficiency 08/20/2021   Polyneuropathy 05/21/2021   Insomnia 10/04/2020   Overweight 10/28/2019   Headache 07/12/2018   Hirsutism 08/28/2017   Gingival enlargement 04/07/2016   Rib pain 02/24/2015   BRBPR (bright red blood per rectum) 04/26/2014   Diverticulosis 04/26/2014   Hyperlipidemia 11/28/2013   Asthma 04/03/2013   Neurofibromatosis, 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   Chronic kidney disease    rt kidney horseshoe   Diverticulitis    Drug abuse (HCC)    Hyperlipemia    Hypertension    Mallory-Weiss tear    Muscle spasms of both lower extremities    Rectal bleed    Retinal detachment    R eye, per patient   Shortness of breath    Past Surgical History:  Past Surgical History:  Procedure Laterality Date   APPENDECTOMY     COLONOSCOPY N/A 04/11/2015   Procedure: COLONOSCOPY;  Surgeon: Margo LITTIE Haddock, MD;  [facility];  Service: Endoscopy;  Laterality: N/A;  8:30 AM   COLONOSCOPY  2013   DIAGNOSTIC LAPAROSCOPY     for horseshoe kidney   ESOPHAGOGASTRODUODENOSCOPY  12/17/2012   Procedure: ESOPHAGOGASTRODUODENOSCOPY (EGD);  Surgeon: Lesta JULIANNA Fitz, MD;  [facility];  Service: Endoscopy;  Laterality: N/A;   Lipoma removal     in 1990s had an egg-sized tumor removed from her leg, she thinks it was a lipoma   repair of detached retina     TUBAL LIGATION     UPPER GASTROINTESTINAL ENDOSCOPY     HPI:  Pt is 73 yo female who presents on 07/26/24 with dizziness and difficulty with speech. CT and MRI neg for acute abnormality. 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 When given additional time, cues to stop and use easy onset with initial sound she is able to accurately express herself without dysnomia or other characteristics associated with an expressive aphasia. She named objects with 100%, answered questions in sentences  given additional time and attempts. SLP introduced strategy to state first sound with slow, easy initiation and if she is tense or has oral groping to stop, restart with easy onset. This was inconsistently successful. Comprehension within normal limits for conversation and 3 step commands. Pt read menu  given additional time accurately. Observed what she had written yesterday which included appropriate semantics in phrases with several words omitted (articels).Pt able to state 911 and to say help if needed.  SLP reiterated easy onset, placed sign on wall and provided her with paper and pen. She will live with daughter at discharge. Encouraged her that her speech is likely to improve. Will see while in hospital and recommend outpatient ST.    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  Frequency and Duration min 2x/week  2 weeks      SLP Evaluation Cognition  Overall Cognitive Status: Within Functional Limits for tasks assessed Arousal/Alertness: Awake/alert Orientation Level: Oriented X4 Attention: Sustained Sustained Attention: Appears intact Memory:  (not formally assessed. Able to state details of disposition, did not appear to have memory deficits) Awareness: Appears intact Problem Solving: Appears intact Safety/Judgment: Other (comment) (limited by apraxia and dysfluency)       Comprehension  Auditory Comprehension Overall Auditory Comprehension: Appears within functional limits for tasks assessed Commands: Within Functional Limits (3 step) Visual Recognition/Discrimination Discrimination: Not tested Reading Comprehension Reading Status: Within funtional limits (slow and deliberate)    Expression Expression Primary Mode of Expression: Verbal Verbal Expression Overall Verbal Expression: Impaired Initiation: Impaired Level of Generative/Spontaneous Verbalization: Sentence Repetition: No impairment Naming: No impairment Pragmatics: No impairment Written Expression Written Expression:  (saw writing from yesterday, omitted several words, more short phrases)   Oral / Motor  Oral Motor/Sensory Function Overall Oral Motor/Sensory Function: Within functional limits Motor Speech Overall Motor Speech: Appears within functional limits for tasks assessed Respiration: Within functional limits Phonation:  Normal Resonance: Within functional limits Articulation: Within functional limitis Intelligibility: Intelligible Motor Planning: Impaired Level of Impairment: Phrase Motor Speech Errors: Groping for words (dysfluent, prolongations) Effective Techniques: Slow rate (easy onset)            Dustin Olam Bull 07/27/2024, 11:35 AM

## 2024-07-27 NOTE — Progress Notes (Signed)
    Durable Medical Equipment  (From admission, onward)           Start     Ordered   07/27/24 1200  For home use only DME Bedside commode  Once       Question:  Patient needs a bedside commode to treat with the following condition  Answer:  Weakness   07/27/24 1159             BSC:  The patient is confined to one level of the home environment and there is no toilet on the that level of the home.

## 2024-07-27 NOTE — TOC Transition Note (Signed)
 Transition of Care [facility]) - Discharge Note   Patient Details  Name: Jennifer Moss MRN: [id number] Date of Birth: [birth year]  [facility] CM/SW Contact:  Andrez JULIANNA George, RN Phone Number: 07/27/2024, 1:41 PM   Clinical Narrative:     Pt is discharging home with outpatient therapies through [facility]. Information on the AVS. Pt/ family will call to schedule the first appointment. No DME at home and no new needs.  Pt is going to stay with her daughter after discharge until they feel she is safe to return home alone.  Daughter is able to assist with transportation. CM also added number for medicaid transportation to her AVS. Daughter will transport home.  Final next level of care: OP Rehab Barriers to Discharge: No Barriers Identified   Patient Goals and CMS Choice     Choice offered to / list presented to : Patient      Discharge Placement                       Discharge Plan and Services Additional resources added to the After Visit Summary for                                       Social Drivers of Health (SDOH) Interventions SDOH Screenings   Food Insecurity: No Food Insecurity (07/26/2024)  Housing: Low Risk  (07/26/2024)  Transportation Needs: No Transportation Needs (07/26/2024)  Utilities: Not At Risk (07/26/2024)  Alcohol Screen: Low Risk  (05/30/2024)  Depression (PHQ2-9): Low Risk  (05/30/2024)  Financial Resource Strain: Low Risk  (05/30/2024)  Physical Activity: Sufficiently Active (05/30/2024)  Social Connections: Moderately Isolated (07/26/2024)  Stress: No Stress Concern Present (05/30/2024)  Tobacco Use: Low Risk  (07/26/2024)  Health Literacy: Adequate Health Literacy (05/30/2024)     Readmission Risk Interventions     No data to display

## 2024-07-27 NOTE — Discharge Instructions (Addendum)
 Dear Jennifer Moss,   Thank you so much for allowing us  to be part of your care!  You were admitted to [facility] for trouble speaking and dizziness.  Our workup did not show a stroke or any other concerning findings.  An ultrasound of your heart (echocardiogram) was overall normal.  The mind-body connection is strong and sometimes the way we feel can strongly affect our physical body.  Our neurologists examined you and would like you to pursue outpatient speech therapy for this issue.  POST-HOSPITAL & CARE INSTRUCTIONS We have placed a referral Please let PCP/Specialists know of any changes that were made. Please see medications section of this packet for any medication changes.  DOCTOR'S APPOINTMENT & FOLLOW UP CARE INSTRUCTIONS  Future Appointments  Date Time Provider Department Center  07/28/2024  2:30 PM Manon Jester, DO [facility]  06/01/2025  8:30 AM [facility] ANNUAL WELLNESS VISIT [facility]    RETURN PRECAUTIONS: Please call EMS or go to the ED if you lose consciousness, have trouble speaking, or feel one side of your body weakening compared to the other  Take care and be well!  Family Medicine Teaching Service Inpatient Team [facility]  [address] [place], [place] [address] [phone]

## 2024-07-27 NOTE — Evaluation (Signed)
 Occupational Therapy Evaluation Patient Details Name: Jennifer Moss MRN: [id number] DOB: [birth year] Today's Date: 07/27/2024   History of Present Illness   Pt is 73 yo female who presents on 07/26/24 with dizziness and difficulty with speech. CT and MRI neg for acute abnormality. PMH: HTN, HLD, COPD, CKD     Clinical Impressions Pta patient reports independent and driving. Admitted for above and presents with problem list below including dizziness, difficulty with speech and communication, and impaired balance. Patient currently requires contact guard for mobility in room and transfers, setup to contact guard for ADLs.  Discussed fall prevention and gaze stabilization techniques.  Utilized writing to decreased frustration with speech. She is able to follow commands but unable to formally assess cognition at this time, appears functional. Will follow acutely, do recommend 24/7 support at dc and assist with IADLS (meds, meals and driving).   Based on performance today, pt will best benefit from continued OT services acutely and after dc at outpatient OT in order to optimize safety, independence and return to PLOF.      If plan is discharge home, recommend the following:   A little help with walking and/or transfers;A little help with bathing/dressing/bathroom;Assistance with cooking/housework;Direct supervision/assist for medications management;Direct supervision/assist for financial management;Assist for transportation;Help with stairs or ramp for entrance;Supervision due to cognitive status     Functional Status Assessment   Patient has had a recent decline in their functional status and demonstrates the ability to make significant improvements in function in a reasonable and predictable amount of time.     Equipment Recommendations   BSC/3in1     Recommendations for Other Services         Precautions/Restrictions   Precautions Precautions: Fall Recall of  Precautions/Restrictions: Intact Precaution/Restrictions Comments: passed out on 7/26 hitting R eye (hematoma)     Mobility Bed Mobility Overal bed mobility: Modified Independent                  Transfers Overall transfer level: Needs assistance   Transfers: Sit to/from Stand Sit to Stand: Contact guard assist           General transfer comment: mildly unsteady, increased dizziness with positional changes but improved with gaze stabilization      Balance Overall balance assessment: Needs assistance Sitting-balance support: No upper extremity supported, Feet supported Sitting balance-Leahy Scale: Good Sitting balance - Comments: close supervision   Standing balance support: No upper extremity supported Standing balance-Leahy Scale: Fair                             ADL either performed or assessed with clinical judgement   ADL Overall ADL's : Needs assistance/impaired     Grooming: Contact guard assist;Wash/dry hands;Oral care;Standing           Upper Body Dressing : Set up;Sitting   Lower Body Dressing: Contact guard assist;Sit to/from stand Lower Body Dressing Details (indicate cue type and reason): increasesd dizziness with forward lean, cueing for figure 4 to reduce dizziness. 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) Ability to See in Adequate Light: 1 Impaired Patient Visual Report: No change from baseline Vision Assessment?: Wears glasses for reading Additional Comments: R eye hematoma, vision appears at baseline  Perception         Praxis         Pertinent Vitals/Pain Pain Assessment Pain Assessment: No/denies pain     Extremity/Trunk Assessment Upper Extremity Assessment Upper Extremity Assessment: Right hand dominant;Overall [facility] for tasks assessed    Lower Extremity Assessment Lower Extremity Assessment: Defer to PT evaluation   Cervical / Trunk Assessment Cervical / Trunk Assessment: Normal   Communication Communication Communication: Impaired Factors Affecting Communication: Difficulty expressing self;Reduced clarity of speech   Cognition Arousal: Alert Behavior During Therapy: Flat affect Cognition: Difficult to assess Difficult to assess due to: Impaired communication           OT - Cognition Comments: patient with difficulty expressing herself, she requires increased time and encoruagement to attempt verbalizing at times, but at times able to speak without difficulty. With writing she is better able to commuinate.  Follows all commands.                 Following commands: Intact       Cueing  General Comments   Cueing Techniques: Verbal cues  educated on gaze stabilization to reduce dizziness with mobility, pt reports slight improvement   Exercises     Shoulder Instructions      Home Living Family/patient expects to be discharged to:: Private residence Living Arrangements: Alone Available Help at Discharge: Family;Available PRN/intermittently Type of Home: Other(Comment) (town house) Home Access: Stairs to enter Secretary/administrator of Steps: 6   Home Layout: Two level;Bed/bath upstairs Alternate Level Stairs-Number of Steps: flight   Bathroom Shower/Tub: Producer, television/film/video: Standard     Home Equipment: None   Additional Comments: pt can stay with daughter if needed but daughter works      Prior Functioning/Environment Prior Level of Function : Independent/Modified Independent;Driving             Mobility Comments: retired from Investment banker, corporate at [employer]. Independent and active. ADLs Comments: independent    OT Problem List: Decreased knowledge of precautions;Decreased knowledge of use of DME or AE;Decreased activity tolerance;Impaired balance (sitting and/or standing)    OT Treatment/Interventions: Self-care/ADL training;DME and/or AE instruction;Therapeutic activities;Patient/family education;Balance training;Cognitive remediation/compensation;Neuromuscular education      OT Goals(Current goals can be found in the care plan section)   Acute Rehab OT Goals Patient Stated Goal: speak OT Goal Formulation: With patient Time For Goal Achievement: 08/10/24 Potential to Achieve Goals: Good   OT Frequency:  Min 2X/week    Co-evaluation              AM-PAC OT 6 Clicks Daily Activity     Outcome Measure Help from another person eating meals?: None Help from another person taking care of personal grooming?: A Little Help from another person toileting, which includes using toliet, bedpan, or urinal?: A Little Help from another person bathing (including washing, rinsing, drying)?: A Little Help from another person to put on and taking off regular upper body clothing?: A Little Help from another person to put on and taking off regular lower body clothing?: A Little 6 Click Score: 19   End of Session Nurse Communication: Mobility status  Activity Tolerance: Patient tolerated treatment well Patient left: in bed;with call bell/phone within reach;with bed alarm set  OT Visit Diagnosis: Unsteadiness on feet (R26.81);Cognitive communication deficit (R41.841);Other symptoms and signs involving the nervous system (M70.101)                Time: 9066-8998 OT Time Calculation (min): 28  min Charges:  OT General Charges $OT Visit: 1 Visit OT Evaluation $OT Eval Moderate Complexity: 1 Mod OT Treatments $Self Care/Home Management : 8-22 mins  Etta NOVAK, OT Acute Rehabilitation Services Office [phone] Secure Chat Preferred    Etta GORMAN Hope 07/27/2024, 10:38 AM

## 2024-07-27 NOTE — Plan of Care (Signed)
  Problem: Clinical Measurements: Goal: Ability to maintain clinical measurements within normal limits will improve Outcome: Progressing Goal: Will remain free from infection Outcome: Progressing Goal: Diagnostic test results will improve Outcome: Progressing Goal: Respiratory complications will improve Outcome: Progressing Goal: Cardiovascular complication will be avoided Outcome: Progressing   Problem: Coping: Goal: Level of anxiety will decrease Outcome: Progressing   Problem: Nutrition: Goal: Adequate nutrition will be maintained Outcome: Progressing   Problem: Pain Managment: Goal: General experience of comfort will improve and/or be controlled Outcome: Progressing   Problem: Elimination: Goal: Will not experience complications related to bowel motility Outcome: Progressing Goal: Will not experience complications related to urinary retention Outcome: Progressing

## 2024-07-27 NOTE — Assessment & Plan Note (Deleted)
 Unclear etiology at this time. Highest on differential includes stress reaction vs TIA. VSS overnight. Head imaging negative. Echo showing LVEF 55-60%, GIDD, otherwise no acute findings. A1c, lipid panel WNL.  - Neurology evaluated, will re-consult if needed - Continue home ASA 81 mg  - Consult for SLP, PT, OT placed - NPO until speech evaluation  - Holding amlodipine  ISO permissive HTN, consider restarting (HTN permissible to 180/100)

## 2024-07-27 NOTE — Assessment & Plan Note (Signed)
 Unclear etiology at this time. Highest on differential includes stress reaction vs TIA. VSS overnight. Head imaging negative. Echo showing LVEF 55-60%, GIDD, otherwise no acute findings. A1c, lipid panel WNL.  - Neurology evaluated, will re-consult if needed - Continue home ASA 81 mg  - Consult for SLP, PT, OT placed - NPO until speech evaluation  - Holding amlodipine  ISO permissive HTN, consider restarting (HTN permissible to 180/100)

## 2024-07-27 NOTE — Progress Notes (Shared)
     Daily Progress Note Intern Pager: [phone]  Patient name: Jennifer Moss Medical record number: [id number] Date of birth: June 29, 1951 Age: 73 y.o. Gender: female  Primary Care Provider: [name], MD Consultants: *** Code Status: ***  Pt Overview and Major Events to Date:  ***  Medical Decision Making:  ***. Pertinent PMH/PSH includes ***.  Assessment & Plan Dizziness Syncope Unclear etiology at this time. Highest on differential includes stress reaction vs TIA. VSS overnight. Head imaging negative. Echo showing LVEF 55-60%, GIDD, otherwise no acute findings. A1c, lipid panel WNL.  - Neurology evaluated, will re-consult if needed - Continue home ASA 81 mg  - Consult for SLP, PT, OT placed - NPO until speech evaluation  - Holding amlodipine  ISO permissive HTN, consider restarting (HTN permissible to 180/100)  Chronic health problem HTN: holding amlopidine 10 mg ISO permissive HTN COPD: continue trelegy 1 puff daily, albuterol  PRN HLD: continue lipitor 10 mg CKD: renally adjust medications prn    *** bullet points preferred for A/P, please avoid repeating one liner/medical decision making, delete statement to sign ***    Chronic and Stable Issues: ***  FEN/GI: *** PPx: *** Dispo:{FPTSDISOLIST:27587} {FPTSDISOTIME:27588}. Barriers include ***.   Subjective:  ***  Objective: Temp:  [98 F (36.7 C)-98.5 F (36.9 C)] 98 F (36.7 C) (07/30 0419) Pulse Rate:  [64-89] 65 (07/30 0419) Resp:  [13-28] 18 (07/30 0419) BP: (99-139)/(71-98) 114/84 (07/30 0419) SpO2:  [97 %-100 %] 100 % (07/30 0419) Weight:  [66.2 kg] 66.2 kg (07/29 2100) Physical Exam: General: *** Cardiovascular: *** Respiratory: *** Abdomen: *** Extremities: ***  Laboratory: Most recent CBC Lab Results  Component Value Date   WBC 7.4 07/26/2024   HGB 14.3 07/26/2024   HCT 42.0 07/26/2024   MCV 85.2 07/26/2024   PLT 388 07/26/2024   Most recent BMP    Latest Ref Rng & Units  07/27/2024    3:35 AM  BMP  Glucose 70 - 99 mg/dL 896   BUN 8 - 23 mg/dL 32   Creatinine 9.55 - 1.00 mg/dL 7.32   Sodium 864 - 854 mmol/L 141   Potassium 3.5 - 5.1 mmol/L 4.9   Chloride 98 - 111 mmol/L 112   CO2 22 - 32 mmol/L 22   Calcium  8.9 - 10.3 mg/dL 9.0    Trop 5,6  LDL 54, Tgs 76, A1c 6.0 Other pertinent labs ***   Imaging/Diagnostic Tests:  Head imaging***  Echo:   1. Technically difficult because of foreshortenned images. Left  ventricular ejection fraction, by estimation, is 55 to 60%. The left  ventricle has normal function. Left ventricular endocardial border not  optimally defined to evaluate regional wall  motion. Left ventricular diastolic parameters are consistent with Grade I  diastolic dysfunction (impaired relaxation).   2. Right ventricular systolic function is normal. The right ventricular  size is normal.   3. The mitral valve is normal in structure. No evidence of mitral valve  regurgitation. No evidence of mitral stenosis.   4. The aortic valve is tricuspid. Aortic valve regurgitation is not  visualized. No aortic stenosis is present.   5. The inferior vena cava is normal in size with greater than 50%  respiratory variability, suggesting right atrial pressure of 3 mmHg.   Radiologist Impression: *** My interpretation: Jennifer Manon Jester, DO 07/27/2024, 7:20 AM  PGY-1, [facility] FPTS Intern pager: [phone], text pages welcome Secure chat group [facility] Teaching Service

## 2024-07-27 NOTE — Progress Notes (Signed)
 NEUROLOGY CONSULT FOLLOW UP NOTE   Date of service: July 27, 2024 Patient Name: Jennifer Moss MRN:  [id number] DOB:  1951-07-24  Interval Hx/subjective   Intially reported not being able to talk. Was able to speak later thou but again stopped talking towards the end of encounter. Speech has a stutter and is slow. No true aphasia. Was earlier writing things down for RN.  Denies any significant stress in life.  Vitals   Vitals:   07/26/24 2100 07/27/24 0038 07/27/24 0419 07/27/24 0800  BP: 107/82 107/82 114/84 113/82  Pulse: 65 65 65 67  Resp: 18 18 18 19   Temp: 98.2 F (36.8 C) 98.2 F (36.8 C) 98 F (36.7 C) 99 F (37.2 C)  TempSrc: Oral Oral  Oral  SpO2:  97% 100% 100%  Weight: 66.2 kg     Height: 5' 2 (1.575 m)        Body mass index is 26.69 kg/m.  Physical Exam   General: Laying comfortably in bed; in no acute distress.  HENT: Normal oropharynx and mucosa. Normal external appearance of ears and nose.  Neck: Supple, no pain or tenderness  CV: No JVD. No peripheral edema.  Pulmonary: Symmetric Chest rise. Normal respiratory effort.  Abdomen: Soft to touch, non-tender.  Ext: No cyanosis, edema, or deformity  Skin: No rash. Normal palpation of skin.   Musculoskeletal: Normal digits and nails by inspection. No clubbing.   Neurologic Examination  Mental status/Cognition: Alert, oriented to self, unable to assess further due to paucity of speech. good attention.  Speech/language: bradyphrenic, non fluent, stuttering speech, comprehension intact to simple commands. Intially reported not being able to talk. Was able to speak later thou but again stopped talking towards the end of encounter. Cranial nerves:  Grossly intact.  Motor:  Moving all extremities spontaneously and antigravity and on command.  Sensation: Grossly intact  Medications  Current Facility-Administered Medications:    acetaminophen  (TYLENOL ) tablet 650 mg, 650 mg, Oral, Q6H PRN, Baker, Amelia  G, DO   albuterol  (PROVENTIL ) (2.5 MG/3ML) 0.083% nebulizer solution 2.5 mg, 2.5 mg, Inhalation, Q6H PRN, Baloch, Mahnoor, MD   aspirin  EC tablet 81 mg, 81 mg, Oral, Daily, Baloch, Mahnoor, MD, 81 mg at 07/27/24 0856   atorvastatin  (LIPITOR) tablet 10 mg, 10 mg, Oral, Daily, Baloch, Mahnoor, MD, 10 mg at 07/27/24 0856   budesonide -glycopyrrolate -formoterol  (BREZTRI ) 160-9-4.8 MCG/ACT inhaler 2 puff, 2 puff, Inhalation, BID, Baloch, Mahnoor, MD, 2 puff at 07/27/24 0857   enoxaparin  (LOVENOX ) injection 30 mg, 30 mg, Subcutaneous, Q24H, Baloch, Mahnoor, MD, 30 mg at 07/27/24 0856   Oral care mouth rinse, 15 mL, Mouth Rinse, PRN, Rosalynn Camie CROME, MD   sodium chloride  flush (NS) 0.9 % injection 3 mL, 3 mL, Intravenous, Once, Baloch, Mahnoor, MD   white petrolatum  (VASELINE) gel, , Topical, PRN, Lennie Raguel MATSU, DO  Labs and Diagnostic Imaging   CBC:  Recent Labs  Lab 07/26/24 0659 07/26/24 0702  WBC 7.4  --   NEUTROABS 4.7  --   HGB 13.1 14.3  HCT 41.5 42.0  MCV 85.2  --   PLT 388  --     Basic Metabolic Panel:  Lab Results  Component Value Date   NA 141 07/27/2024   K 4.9 07/27/2024   CO2 22 07/27/2024   GLUCOSE 103 (H) 07/27/2024   BUN 32 (H) 07/27/2024   CREATININE 2.67 (H) 07/27/2024   CALCIUM  9.0 07/27/2024   GFRNONAA 19 (L) 07/27/2024   GFRAA 36 (L)  05/15/2020   Lipid Panel:  Lab Results  Component Value Date   LDLCALC 54 07/27/2024   HgbA1c:  Lab Results  Component Value Date   HGBA1C 6.0 (H) 07/26/2024   Urine Drug Screen:     Component Value Date/Time   LABOPIA NONE DETECTED 07/26/2024 0844   COCAINSCRNUR NONE DETECTED 07/26/2024 0844   LABBENZ NONE DETECTED 07/26/2024 0844   AMPHETMU NONE DETECTED 07/26/2024 0844   THCU NONE DETECTED 07/26/2024 0844   LABBARB NONE DETECTED 07/26/2024 0844    Alcohol Level     Component Value Date/Time   ETH <15 07/26/2024 0700   INR  Lab Results  Component Value Date   INR 0.9 07/26/2024   APTT  Lab Results   Component Value Date   APTT 32 07/26/2024   AED levels: No results found for: PHENYTOIN, ZONISAMIDE, LAMOTRIGINE, LEVETIRACETA  MRI Brain(Personally reviewed): 1. No acute intracranial abnormality. 2. Advanced chronic signal changes in the cerebral white matter and pons, not significantly changed since 2022, and favored to be chronic small vessel disease related.  Assessment   Jennifer Moss is a 73 y.o. female with a history of hypertension, hyperlipidemia, CKD who presents with inability to verbalize. Noted to be able to speak mid encounter today, thou initially reported that she was unable to.  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 I discussed with her in detail and reassured her that her MRI does not show an acute stroke. She will significantly benefit from PT/OT/SLP evaluation and will recommend continued follow up with PT/OT/SLP outpatient.  Recommendations  - Will benefit from PT/OT/SLP. - Neurology will signoff. Please feel free to contact us  with any questions or concerns. ______________________________________________________________________   Signed, Rosibel Giacobbe, MD [facility]

## 2024-07-27 NOTE — Progress Notes (Signed)
 Tele monitor placed at nurse station.  RN Suzen called cardiac tele.  RN spoke with patient.  Patient refused Discharge lounge.  Daughter will pick up between 3 and 5pm,

## 2024-07-27 NOTE — Assessment & Plan Note (Signed)
 HTN: holding amlopidine 10 mg ISO permissive HTN COPD: continue trelegy 1 puff daily, albuterol  PRN HLD: continue lipitor 10 mg CKD: renally adjust medications prn

## 2024-07-28 ENCOUNTER — Telehealth: Payer: Self-pay

## 2024-07-28 ENCOUNTER — Ambulatory Visit

## 2024-07-28 VITALS — BP 137/98 | HR 71 | Ht 60.0 in | Wt 142.4 lb

## 2024-07-28 DIAGNOSIS — I10 Essential (primary) hypertension: Secondary | ICD-10-CM

## 2024-07-28 DIAGNOSIS — E785 Hyperlipidemia, unspecified: Secondary | ICD-10-CM | POA: Diagnosis not present

## 2024-07-28 DIAGNOSIS — N184 Chronic kidney disease, stage 4 (severe): Secondary | ICD-10-CM | POA: Diagnosis not present

## 2024-07-28 DIAGNOSIS — F444 Conversion disorder with motor symptom or deficit: Secondary | ICD-10-CM | POA: Diagnosis not present

## 2024-07-28 NOTE — Assessment & Plan Note (Signed)
 Patient's last lipid panel showing LDL of 54 -Patient instructed to resume Lipitor

## 2024-07-28 NOTE — Assessment & Plan Note (Addendum)
 Blood pressure 137/98 in clinic today - Patient instructed to resume home amlodipine  w/ instruction to d/c if she feels faint or dizzy.

## 2024-07-28 NOTE — Assessment & Plan Note (Signed)
 Pt recently discharged from [facility] after negative stroke workup, with presumptive diagnosis of functional disorder.  - Outpatient follow-up with PT/OT and speech scheduled for 8/19

## 2024-07-28 NOTE — Progress Notes (Signed)
     SUBJECTIVE:   CHIEF COMPLAINT / HPI:   Jennifer Moss presents today for hospital follow up.   Hospitalized at [facility] from 7/29 to 7/30, for stroke rule out.  Since discharge, patient reports improvement in her dizziness with some continued word finding difficulties.  Discussed proposed diagnoses for her symptoms and reinforced the importance of outpatient therapy follow-up with speech PT and OT.  Patient understands and she and her daughter intends to attend their outpatient appointments on 8/19.  Patient denies significant mood or anxiety challenges, reports that she has always tried to stay positive and does not have interest in taking medicines for anxiety or mood. She is motivated to work towards improvement.   Daughter present during visit.   PERTINENT  PMH / PSH: Hypertension, COPD, CKD 4, hyperlipidemia.  OBJECTIVE:   BP (!) 137/98   Pulse 71   Ht 5' (1.524 m)   Wt 142 lb 6.4 oz (64.6 kg)   SpO2 99%   BMI 27.81 kg/m   General: Awake, alert, NAD. Communicating slowly w/ intermittent stuttering. Cardio: RRR. Normal S1, S2. No murmur, rub, gallop.  Resp: CTA bilaterally. No wheezes, rales, or rhonchi. Normal work of breathing on room air Neuro: Oriented x 4, strength 5 out of 5 in bilateral upper and lower extremities.  Sensation symmetrical and intact bilaterally.  Cranial nerves II through XII intact.  Coordination intact with finger-to-nose bilaterally.  Gait smooth and symmetrical.   ASSESSMENT/PLAN:   Assessment & Plan CKD (chronic kidney disease) stage 4, GFR 15-29 ml/min (HCC) F/u BMP for Cr of 2.6 on discharge Patient currently on kidney transplant list w/ reliable follow up at the kidney center Functional aphonia Pt recently discharged from [facility] after negative stroke workup, with presumptive diagnosis of functional disorder.  - Outpatient follow-up with PT/OT and speech scheduled for 8/19 Primary hypertension Blood pressure 137/98 in clinic  today - Patient instructed to resume home amlodipine  w/ instruction to d/c if she feels faint or dizzy.  Hyperlipidemia, unspecified hyperlipidemia type Patient's last lipid panel showing LDL of 54 -Patient instructed to resume Lipitor   Leafy Scriver, DO [facility]

## 2024-07-28 NOTE — Assessment & Plan Note (Signed)
 F/u BMP for Cr of 2.6 on discharge Patient currently on kidney transplant list w/ reliable follow up at the kidney center

## 2024-07-28 NOTE — Transitions of Care (Post Inpatient/ED Visit) (Signed)
 07/28/2024  Name: Jennifer [facility] MRN: [id number] DOB: May 14, 1951  Today's TOC FU Call Status: Today's TOC FU Call Status:: Successful TOC FU Call Completed TOC FU Call Complete Date: 07/28/24 Patient's Name and Date of Birth confirmed.  Transition Care Management Follow-up Telephone Call Date of Discharge: 07/27/24 [facility]: [facility] Type of Discharge: Inpatient Admission Primary Inpatient Discharge Diagnosis:: syncope How have you been since you were released from the [facility]?: Better Any questions or concerns?: No  Items Reviewed: Did you receive and understand the discharge instructions provided?: Yes Medications obtained,verified, and reconciled?: Yes (Medications Reviewed) Any new allergies since your discharge?: No Dietary orders reviewed?: Yes Do you have support at home?: Yes People in Home [RPT]: child(ren), adult  Medications Reviewed Today: Medications Reviewed Today     Reviewed by Jennifer Pan, LPN (Licensed Practical Nurse) on 07/28/24 at 1008  Med List Status: <None>   Medication Order Taking? Sig Documenting Provider Last Dose Status Informant  acetaminophen  (TYLENOL ) 325 MG tablet [id number]  Take 2 tablets (650 mg total) by mouth every 6 (six) hours as needed for mild pain (pain score 1-3). Moss, Dimitry, MD  Active   albuterol  (VENTOLIN  HFA) 108 (90 Base) MCG/ACT inhaler [id number]  Inhale 1-2 puffs into the lungs every 6 (six) hours as needed for wheezing or shortness of breath. Jennifer Laymon [name], MD  Active Self, Pharmacy Records, Multiple Informants  aspirin  EC 81 MG EC tablet [id number] Yes Take 1 tablet (81 mg total) by mouth daily.  Patient taking differently: Take 81 mg by mouth in the morning.   Jennifer Dallas GAILS, MD  Active Self, Pharmacy Records, Multiple Informants  atorvastatin  (LIPITOR) 10 MG tablet [id number]  Take 1 tablet by mouth once daily Moss, Jennifer J, MD  Active Self, Pharmacy Records, Multiple Informants   Cholecalciferol  (VITAMIN D3 PO) [id number]  Take 2,000 Int'l Units/day by mouth daily at 12 noon. [provider]  Active Self, Pharmacy Records, Multiple Informants  Cyanocobalamin (B-12) 1000 MCG CAPS [id number]  Take 1,000 mcg by mouth daily. Jennifer Laymon [name], MD  Active Self, Pharmacy Records, Multiple Informants  fexofenadine (ALLEGRA) 180 MG tablet [id number]  Take 180 mg by mouth daily as needed for allergies or rhinitis. [provider]  Active Self, Pharmacy Records, Multiple Informants  fluticasone  (FLONASE ) 50 MCG/ACT nasal spray [id number]  Place 2 sprays into both nostrils daily. Jennifer Laymon [name], MD  Active Self, Pharmacy Records, Multiple Informants  lactulose  (CHRONULAC ) 10 GM/15ML solution [id number] Yes TAKE 15 ML  (10G TOTAL) BY MOUTH ONCE DAILY  Patient taking differently: Take 10 g by mouth daily.   Jennifer Laymon [name], MD  Active Self, Pharmacy Records, Multiple Informants  Omega-3 Fatty Acids (FISH OIL PO) [id number]  Take 1 capsule by mouth in the morning.  [provider]  Active Self, Pharmacy Records, Multiple Informants  TRELEGY ELLIPTA  100-62.5-25 MCG/ACT AEPB [id number] Yes Inhale 1 puff by mouth once daily  Patient taking differently: Inhale 1 puff into the lungs daily at 12 noon.   Jennifer Laymon [name], MD  Active Self, Pharmacy Records, Multiple Informants            Home Care and Equipment/Supplies: Were Home Health Services Ordered?: NA Any new equipment or medical supplies ordered?: NA  Functional Questionnaire: Do you need assistance with bathing/showering or dressing?: Yes Do you need assistance with meal preparation?: Yes Do you need assistance with eating?: No Do you have difficulty maintaining continence: No Do you need assistance with getting  out of bed/getting out of a chair/moving?: No Do you have difficulty managing or taking your medications?: No  Follow up appointments reviewed: PCP Follow-up appointment  confirmed?: Yes Date of PCP follow-up appointment?: 07/28/24 Follow-up Provider: [facility] Follow-up appointment confirmed?: NA Do you need transportation to your follow-up appointment?: No Do you understand care options if your condition(s) worsen?: Yes-patient verbalized understanding    SIGNATURE Jennifer Lemmings, LPN [facility] Nurse Health Advisor Direct Dial [phone]

## 2024-08-15 NOTE — Therapy (Signed)
 OUTPATIENT PHYSICAL THERAPY NEURO EVALUATION   Patient Name: Jennifer [facility] MRN: [id number] DOB:[birth year], 73 y.o., female Today's Date: 08/16/2024   PCP: Laymon Legions REFERRING PROVIDER: Camie Mulch  END OF SESSION:  PT End of Session - 08/16/24 1257     Visit Number 1    Date for PT Re-Evaluation 10/11/24    Authorization Type [employer]    PT Start Time 1315    PT Stop Time 1400    PT Time Calculation (min) 45 min          Past Medical History:  Diagnosis Date   Anginal pain (HCC)    Arthritis    Cataract    repaired.   Chronic health problem 07/26/2024   Chronic kidney disease    rt kidney horseshoe   Constipation 01/28/2013   Diverticulitis    Drug abuse (HCC)    Hyperlipemia    Hypertension    Injury of left shoulder 01/19/2013   Leg pain 01/11/2013   Mallory-Weiss tear    Muscle spasms of both lower extremities    Prolonged Q-T interval on ECG 01/11/2013   Rectal bleed    Retinal detachment    R eye, per patient   Routine adult health maintenance 11/19/2023   Shortness of breath    Past Surgical History:  Procedure Laterality Date   APPENDECTOMY     COLONOSCOPY N/A 04/11/2015   Procedure: COLONOSCOPY;  Surgeon: Margo LITTIE Haddock, MD;  [facility];  Service: Endoscopy;  Laterality: N/A;  8:30 AM   COLONOSCOPY  2013   DIAGNOSTIC LAPAROSCOPY     for horseshoe kidney   ESOPHAGOGASTRODUODENOSCOPY  12/17/2012   Procedure: ESOPHAGOGASTRODUODENOSCOPY (EGD);  Surgeon: Lesta JULIANNA [facility], MD;  Location: [facility] ENDOSCOPY;  Service: Endoscopy;  Laterality: N/A;   Lipoma removal     in 1990s had an egg-sized tumor removed from her leg, she thinks it was a lipoma   repair of detached retina     TUBAL LIGATION     UPPER GASTROINTESTINAL ENDOSCOPY     Patient Active Problem List   Diagnosis Date Noted   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   Dizziness 07/26/2024   Syncope 07/26/2024   Prediabetes 05/20/2024   COPD (chronic obstructive pulmonary disease) (HCC)  11/19/2023   Allergic rhinitis 04/30/2023   Vitamin D  deficiency 08/20/2021   Polyneuropathy 05/21/2021   Insomnia 10/04/2020   Overweight 10/28/2019   Headache 07/12/2018   Hirsutism 08/28/2017   Gingival enlargement 04/07/2016   BRBPR (bright red blood per rectum) 04/26/2014   Diverticulosis 04/26/2014   Hyperlipidemia 11/28/2013   Asthma 04/03/2013   Neurofibromatosis, 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   Mass of soft tissue 01/12/2013   Mallory-Weiss tear 12/23/2012   Rectal bleeding 12/23/2012   Hypertension 12/23/2012   Drug abuse (including cocaine) 12/23/2012   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 The room started spinning and I called the ambulance. They ruled out everything. I did have a fall but I don't remember that whole thing. The dizziness started after the fall. I am fine throughout the day but when I go to lay on my left side that is when things start spinning. It is not as bad now but still happens.   Pt accompanied by: self  PERTINENT HISTORY: Patient was admitted for observation after having multiple episodes of dizziness overnight on 7/28. She had a fall that she does not remember and hit her face. Patient was initially seen in the ED for stroke rule out.  CT and MRI negative.  EKG  was normal.  Patient had some difficulty finding words and stuttering, however no other focal neurological deficits were noted.  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  Patient was observed overnight.  Echocardiogram showed LVEF 55-60%, Grade I diastolic dysfunction.  Patient continued to do well without any further neurological deficits. Infectious work up was negative. Suspect symptoms likely related to stress from recent health changes.  SLP saw patient and recommended outpatient speech therapy, referral was placed at discharge.  Patient was at neurological baseline except for aphemia at discharge.    PAIN:  Are you having pain? No  PRECAUTIONS: Fall  RED FLAGS: None   WEIGHT BEARING RESTRICTIONS: No  FALLS: Has patient fallen in last 6 months? Yes. Number of falls 1  LIVING ENVIRONMENT: Lives with: lives alone Lives in: House/apartment Stairs: Yes: Internal: 13 steps; on right going up and External: 6 steps; can reach both   PLOF: Independent and Independent with gait  PATIENT GOALS: figure out what is going on   OBJECTIVE:  Note: Objective measures were completed at Evaluation unless otherwise noted.  DIAGNOSTIC FINDINGS: CT head: 1. No acute cortically based infarct or acute intracranial hemorrhage identified. ASPECTS 10. 2. Moderate for age cerebral white matter disease appears similar to 2022 MRI. 3. These results were communicated to Dr. Michaela at 7:01 am on 07/26/2024 by text page via the [facility] messaging system.   MRI Brain: 1. No acute intracranial abnormality. 2. Advanced chronic signal changes in the cerebral white matter and pons, not significantly changed since 2022, and favored to be chronic small vessel disease related.   MR Angio Head: 1. Negative for large vessel occlusion. Generalized arterial tortuosity, including ectatic Right ICA. No intracranial artery stenosis identified.   CT Maxillofacial:  1. Right periorbital soft tissue  swelling.    CXR: No acute cardiopulmonary disease   COGNITION: Overall cognitive status: Within functional limits for tasks assessed   SENSATION: WFL  COORDINATION: Some decreased coordination and balance deficits  LOWER EXTREMITY ROM:   WFL   LOWER EXTREMITY MMT:  5/5   BED MOBILITY:  Findings: Supine to sit Complete Independence and but some dizziness, has to sit for a second before getting up Rolling to Left Modified independence and increased dizziness when turning to L   GAIT: Findings: Gait Characteristics: WFL, Distance walked: in clinic distances, and Comments: some veering and slight LOB but overall is pretty stable and safe  FUNCTIONAL TESTS:  BERG 48/56  TREATMENT DATE: 08/16/24 Canalith repositioning for L side BPPV - increased dizziness with L side and reports the room was spinning, eased off after a few seconds -some dizziness with dumping as well      PATIENT EDUCATION: Education details: POC, Brandt-Daroff exercises, anatomy and cause of BPPV Person educated: Patient Education method: Explanation Education comprehension: verbalized understanding  HOME EXERCISE PROGRAM: Wilhelmena Carrel exercise program    GOALS: Goals reviewed with patient? Yes  SHORT TERM GOALS: Target date: 09/13/24  Patient will be independent with initial HEP. Baseline:  Goal status: INITIAL  2.  Patient will demonstrate tandem stance 30s or better on both sides Baseline: needs helps getting in position, only hold a few secs Goal status: INITIAL    LONG TERM GOALS: Target date: 10/11/24  Patient will be independent with advanced/ongoing HEP to improve outcomes and carryover.  Baseline:  Goal status: INITIAL  2.  Patient will decrease dizziness by 50%.  Baseline:  Goal status: INITIAL  3.  Patient will be able to sleep and lay on her L  side without dizziness.  Baseline:  Goal status: INITIAL   4.  Patient will demonstrate SLS >10s  Baseline: 2s Goal status: INITIAL    ASSESSMENT:  CLINICAL IMPRESSION: Patient is a 73 y.o. female who was seen today for physical therapy evaluation and treatment for syncope. She reports a fall and has been dizzy since then. 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 She reports being dizzy when she moves too quick, which also started after the fall. Her dizziness in mostly left sided and felt when she lays on that side. She may be orthostatic but it was not checked during eval. I suspect the patient to have some post concussive symptoms and vertigo as a result of the fall. Patient has increased symptoms with Dix-Hallpike on the L side, so we went straight into doing an Epley maneuver. Her symptoms lasted for a few seconds and got better. We went over Brandt-Daroff exercises to work on home. Pt will benefit from PT to address her dizziness to be able to sleep and lay on her L side without increase in symptoms.   OBJECTIVE IMPAIRMENTS: decreased balance and dizziness.   ACTIVITY LIMITATIONS: stairs, bed mobility, and locomotion level  PARTICIPATION LIMITATIONS: cleaning, shopping, and community activity  REHAB POTENTIAL: Good  CLINICAL DECISION MAKING: Evolving/moderate complexity  EVALUATION COMPLEXITY: Moderate  PLAN:  PT FREQUENCY: 1-2x/week  PT DURATION: 8 weeks  PLANNED INTERVENTIONS: 97110-Therapeutic exercises, 97530- Therapeutic activity, 97112- Neuromuscular re-education, 97535- Self Care, [id number]- Manual therapy, [phone]- Canalith repositioning, Patient/Family education, Balance training, Stair training, Vestibular training, Cryotherapy, and Moist heat  PLAN FOR NEXT SESSION: check orthostatics, see how exercises are going, retest L side canal again    Almetta Fam, PT 08/16/2024, 1:59 PM

## 2024-08-16 ENCOUNTER — Ambulatory Visit: Admitting: Occupational Therapy

## 2024-08-16 ENCOUNTER — Ambulatory Visit: Admitting: Speech Pathology

## 2024-08-16 ENCOUNTER — Encounter: Payer: Self-pay | Admitting: Speech Pathology

## 2024-08-16 ENCOUNTER — Encounter: Payer: Self-pay | Admitting: Occupational Therapy

## 2024-08-16 ENCOUNTER — Ambulatory Visit: Attending: Family Medicine

## 2024-08-16 DIAGNOSIS — H8112 Benign paroxysmal vertigo, left ear: Secondary | ICD-10-CM | POA: Insufficient documentation

## 2024-08-16 DIAGNOSIS — F985 Adult onset fluency disorder: Secondary | ICD-10-CM

## 2024-08-16 DIAGNOSIS — R41841 Cognitive communication deficit: Secondary | ICD-10-CM

## 2024-08-16 DIAGNOSIS — R482 Apraxia: Secondary | ICD-10-CM | POA: Diagnosis not present

## 2024-08-16 DIAGNOSIS — R42 Dizziness and giddiness: Secondary | ICD-10-CM | POA: Insufficient documentation

## 2024-08-16 DIAGNOSIS — R55 Syncope and collapse: Secondary | ICD-10-CM | POA: Diagnosis not present

## 2024-08-16 DIAGNOSIS — R479 Unspecified speech disturbances: Secondary | ICD-10-CM | POA: Diagnosis not present

## 2024-08-16 NOTE — Therapy (Deleted)
 SABRA

## 2024-08-16 NOTE — Therapy (Unsigned)
 OUTPATIENT SPEECH LANGUAGE PATHOLOGY EVALUATION   Patient Name: Jennifer [facility] MRN: [id number] DOB:1951-08-20, 73 y.o., female Today's Date: 08/17/2024  PCP: Jennifer Laymon [name], MD REFERRING PROVIDER: Rosalynn Camie CROME, MD  END OF SESSION:  End of Session - 08/16/24 1236     Visit Number 1    Number of Visits 8    Date for SLP Re-Evaluation 10/16/24    SLP Start Time 1230    SLP Stop Time  1310    SLP Time Calculation (min) 40 min    Activity Tolerance Patient tolerated treatment well          Past Medical History:  Diagnosis Date   Anginal pain (HCC)    Arthritis    Cataract    repaired.   Chronic health problem 07/26/2024   Chronic kidney disease    rt kidney horseshoe   Constipation 01/28/2013   Diverticulitis    Drug abuse (HCC)    Hyperlipemia    Hypertension    Injury of left shoulder 01/19/2013   Leg pain 01/11/2013   Mallory-Weiss tear    Muscle spasms of both lower extremities    Prolonged Q-T interval on ECG 01/11/2013   Rectal bleed    Retinal detachment    R eye, per patient   Routine adult health maintenance 11/19/2023   Shortness of breath    Past Surgical History:  Procedure Laterality Date   APPENDECTOMY     COLONOSCOPY N/A 04/11/2015   Procedure: COLONOSCOPY;  Surgeon: [facility] Haddock, MD;  [facility];  Service: Endoscopy;  Laterality: N/A;  8:30 AM   COLONOSCOPY  2013   DIAGNOSTIC LAPAROSCOPY     for horseshoe kidney   ESOPHAGOGASTRODUODENOSCOPY  12/17/2012   Procedure: ESOPHAGOGASTRODUODENOSCOPY (EGD);  Surgeon: Jennifer JULIANNA Fitz, MD;  Location: [facility] ENDOSCOPY;  Service: Endoscopy;  Laterality: N/A;   Lipoma removal     in 1990s had an egg-sized tumor removed from her leg, she thinks it was a lipoma   repair of detached retina     TUBAL LIGATION     UPPER GASTROINTESTINAL ENDOSCOPY     Patient Active Problem List   Diagnosis Date Noted   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   Dizziness 07/26/2024   Syncope 07/26/2024   Prediabetes  05/20/2024   COPD (chronic obstructive pulmonary disease) (HCC) 11/19/2023   Allergic rhinitis 04/30/2023   Vitamin D  deficiency 08/20/2021   Polyneuropathy 05/21/2021   Insomnia 10/04/2020   Overweight 10/28/2019   Headache 07/12/2018   Hirsutism 08/28/2017   Gingival enlargement 04/07/2016   BRBPR (bright red blood per rectum) 04/26/2014   Diverticulosis 04/26/2014   Hyperlipidemia 11/28/2013   Asthma 04/03/2013   Neurofibromatosis, 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   Mass of soft tissue 01/12/2013   Mallory-Weiss tear 12/23/2012   Rectal bleeding 12/23/2012   Hypertension 12/23/2012   Drug abuse (including cocaine) 12/23/2012   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 CT and MRI neg  for acute abnormality. PMH: HTN, HLD, COPD, CKD   PAIN:  Are you having pain? No  FALLS: Has patient fallen in last 6 months?  Yes; fall  LIVING ENVIRONMENT: Lives with: lives alone Lives in: House/apartment  PLOF:  Level of assistance: Independent with ADLs, Independent with IADLs Employment: Retired; Pharmacologist (Nursing Tech)   PATIENT GOALS:   OBJECTIVE:  Note: Objective measures were completed at Evaluation unless otherwise noted.  DIAGNOSTIC FINDINGS: Per EMR:  MR BRAIN WO CONTRAST  IMPRESSION: 1. No acute intracranial abnormality. 2. Advanced chronic signal changes in the cerebral white matter  and pons, not significantly changed since 2022, and favored to be chronic small vessel disease related.     Electronically Signed   By: [facility] M.D.   On: 07/26/2024 07:52  COGNITION: Overall cognitive status: Impaired Areas of impairment:  Attention: Impaired: Selective, Alternating, Divided Memory: Impaired: Working Proofreader function: Impaired: Problem solving, Organization, Planning, Error awareness, and Self-correction Functional deficits: See below  AUDITORY COMPREHENSION: Overall auditory comprehension: Appears intact  READING COMPREHENSION: Not tested at this time  EXPRESSION: verbal  VERBAL EXPRESSION: Level of generative/spontaneous verbalization: conversation   WRITTEN EXPRESSION: Dominant hand: right Written expression: Not tested  MOTOR SPEECH: Overall motor speech: impaired Level of impairment: Sentence and Conversation Respiration: thoracic breathing Phonation: normal Resonance: WFL Articulation: Appears intact Intelligibility: Intelligible Motor planning: Impaired: aware, groping for words, and inconsistent Motor speech errors: aware, groping for words, and inconsistent Interfering components: NA Effective technique: slow rate and easy onset  ORAL MOTOR EXAMINATION: Overall status: WFL Comments: NA    STANDARDIZED ASSESSMENTS: SLUMS: 9/30   Informal speech assessment  Repetition: words of increasing length: 3/5 (word/sound errors) Sentences: Initiation sound repetition, prolongations of sounds (mostly initial placement)  AMRs: No difficulty SMRs: No difficulty  Story Retell: No errors observed  Reading paragraph: Articulatory groping, sound prolongations, and initial sound repetition   PATIENT REPORTED OUTCOME MEASURES (PROM): To complete in subsequent sessions                                                                                                                             TREATMENT DATE:     PATIENT EDUCATION: Education details: SLP role  Person educated: Patient Education method: Explanation Education comprehension: verbalized understanding and needs further education   GOALS: Goals reviewed with patient? No  SHORT TERM GOALS: Target date: 09/16/24  Pt will complete PROM  Baseline: Goal status: INITIAL  2.  Pt will recall strategies for neurogenic stuttering with minA Baseline:  Goal status: INITIAL  3.  Pt will verbalize strategies to improve recall of important information.  Baseline:  Goal status: INITIAL    LONG TERM GOALS: Target date: 10/16/24  Pt will improve score on PROM Baseline:  Goal status: INITIAL  2.  Pt will demonstrate use of strategies for neurogenic stuttering.  Baseline:  Goal status: INITIAL  3.  Pt will report successful use  of memory strategies at home/in community.  Baseline:  Goal status: INITIAL    ASSESSMENT:  CLINICAL IMPRESSION: Pt is a 73 yo female who presents to ST [facility] for evaluation post syncope episode. 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 Pt endorses she has improved, but is continuing to re: saying the wrong word (I.e. doctor for daughter), get stuck on a word - has thought in head and trying to produce it verbally but can't, forgetting things. Pt was assessed using SLUMS and informal speech measures - see above. 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  SLP observed sound prolongations and initial sound repetition, as well as occasional articulatory posturing. She exhibited avoidance behaviors in attempt to circumnavigate stuttering behaviors and reports embarrassment. SLP rec skilled ST services to address speech and cognitive-communication challenges.     OBJECTIVE IMPAIRMENTS: include attention, memory, executive functioning, and neurogenic stuttering.  These impairments are limiting patient from effectively communicating at home and in community. Factors affecting potential to achieve goals and functional outcome are NA. Patient will benefit from skilled SLP services to address above impairments and improve overall function.  REHAB POTENTIAL: Good  PLAN:  SLP FREQUENCY: 1x/week  SLP DURATION: 8 weeks  PLANNED INTERVENTIONS: Language facilitation, Environmental controls, Cueing hierachy, Internal/external aids, Functional tasks, Multimodal communication approach, SLP instruction and feedback, Compensatory strategies, Patient/family education, and [id number] Treatment of speech (30 or 45 min)     [employer], CCC-SLP 08/17/2024, 1:10 PM

## 2024-08-17 NOTE — Therapy (Signed)
 OUTPATIENT PHYSICAL THERAPY NEURO TREATMENT   Patient Name: Jennifer Moss MRN: [id number] DOB:1951-12-20, 73 y.o., female Today's Date: 08/18/2024   PCP: Laymon Legions REFERRING PROVIDER: Camie Mulch  END OF SESSION:  PT End of Session - 08/18/24 1143     Visit Number 2    Date for PT Re-Evaluation 10/11/24    Authorization Type [employer]    PT Start Time 1145    PT Stop Time 1230    PT Time Calculation (min) 45 min           Past Medical History:  Diagnosis Date   Anginal pain (HCC)    Arthritis    Cataract    repaired.   Chronic health problem 07/26/2024   Chronic kidney disease    rt kidney horseshoe   Constipation 01/28/2013   Diverticulitis    Drug abuse (HCC)    Hyperlipemia    Hypertension    Injury of left shoulder 01/19/2013   Leg pain 01/11/2013   Mallory-Weiss tear    Muscle spasms of both lower extremities    Prolonged Q-T interval on ECG 01/11/2013   Rectal bleed    Retinal detachment    R eye, per patient   Routine adult health maintenance 11/19/2023   Shortness of breath    Past Surgical History:  Procedure Laterality Date   APPENDECTOMY     COLONOSCOPY N/A 04/11/2015   Procedure: COLONOSCOPY;  Surgeon: Margo LITTIE Haddock, MD;  [facility];  Service: Endoscopy;  Laterality: N/A;  8:30 AM   COLONOSCOPY  2013   DIAGNOSTIC LAPAROSCOPY     for horseshoe kidney   ESOPHAGOGASTRODUODENOSCOPY  12/17/2012   Procedure: ESOPHAGOGASTRODUODENOSCOPY (EGD);  Surgeon: Lesta JULIANNA Fitz, MD;  Location: [facility] ENDOSCOPY;  Service: Endoscopy;  Laterality: N/A;   Lipoma removal     in 1990s had an egg-sized tumor removed from her leg, she thinks it was a lipoma   repair of detached retina     TUBAL LIGATION     UPPER GASTROINTESTINAL ENDOSCOPY     Patient Active Problem List   Diagnosis Date Noted   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   Dizziness 07/26/2024   Syncope 07/26/2024   Prediabetes 05/20/2024   COPD (chronic obstructive pulmonary disease) (HCC)  11/19/2023   Allergic rhinitis 04/30/2023   Vitamin D  deficiency 08/20/2021   Polyneuropathy 05/21/2021   Insomnia 10/04/2020   Overweight 10/28/2019   Headache 07/12/2018   Hirsutism 08/28/2017   Gingival enlargement 04/07/2016   BRBPR (bright red blood per rectum) 04/26/2014   Diverticulosis 04/26/2014   Hyperlipidemia 11/28/2013   Asthma 04/03/2013   Neurofibromatosis, 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   Mass of soft tissue 01/12/2013   Mallory-Weiss tear 12/23/2012   Rectal bleeding 12/23/2012   Hypertension 12/23/2012   Drug abuse (including cocaine) 12/23/2012   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 Yesterday I got up early and tried to do the exercises, the R side was perfect. I went to do the L side, and when I got up the room start spinning and I flew back in the bed. I couldn't even move yesterday, I just stayed home in the bed. I was scared to walk down the stairs.   Pt accompanied by: self  PERTINENT HISTORY: Patient was admitted for observation after having multiple episodes of dizziness overnight on 7/28. She had a fall that she does not remember and hit her face. Patient was initially seen in the ED for stroke rule out.  CT and MRI negative.  EKG was normal.  Patient had some difficulty finding words  and stuttering, however no other focal neurological deficits were noted.  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  Patient was observed overnight.  Echocardiogram showed LVEF 55-60%, Grade I diastolic dysfunction.  Patient continued to do well without any further neurological deficits. Infectious work up was negative. Suspect symptoms likely related to stress from recent health changes.  SLP saw patient and recommended outpatient speech therapy, referral was placed at discharge.  Patient was at neurological baseline except for aphemia at discharge.    PAIN:  Are you having pain? No  PRECAUTIONS: Fall  RED FLAGS: None   WEIGHT BEARING RESTRICTIONS: No  FALLS: Has patient fallen in last 6 months? Yes. Number of falls 1  LIVING ENVIRONMENT: Lives with: lives alone Lives in: House/apartment Stairs: Yes: Internal: 13 steps; on right going up and External: 6 steps; can reach both   PLOF: Independent and Independent with gait  PATIENT GOALS: figure out what is going on   OBJECTIVE:  Note: Objective measures were completed at Evaluation unless otherwise noted.  DIAGNOSTIC FINDINGS: CT head: 1. No acute cortically based infarct or acute intracranial hemorrhage identified. ASPECTS 10. 2. Moderate for age cerebral white matter disease appears similar to 2022 MRI. 3. These results were communicated to Dr. Michaela at 7:01 am on 07/26/2024 by text page via the [facility] messaging system.   MRI Brain: 1. No acute intracranial abnormality. 2. Advanced chronic signal changes in the cerebral white matter and pons, not significantly changed since 2022, and favored to be chronic small vessel disease related.   MR Angio Head: 1. Negative for large vessel occlusion. Generalized arterial tortuosity, including ectatic Right ICA. No intracranial artery stenosis identified.   CT Maxillofacial:  1. Right periorbital soft tissue swelling.    CXR: No acute cardiopulmonary disease    COGNITION: Overall cognitive status: Within functional limits for tasks assessed   SENSATION: WFL  COORDINATION: Some decreased coordination and balance deficits  LOWER EXTREMITY ROM:   WFL   LOWER EXTREMITY MMT:  5/5   BED MOBILITY:  Findings: Supine to sit Complete Independence and but some dizziness, has to sit for a second before getting up Rolling to Left Modified independence and increased dizziness when turning to L   GAIT: Findings: Gait Characteristics: WFL, Distance walked: in clinic distances, and Comments: some veering and slight LOB but overall is pretty stable and safe  FUNCTIONAL TESTS:  BERG 48/56  TREATMENT DATE:  08/18/24 Redo canalith repositioning for L side -increased symptoms and PUP with L side dumping -some dizziness when dumping down on floor Second trial for L Epley -sx presentation lasted only a few seconds  Reviewed Brandt-Daroff exercises  VOR x1 VOR x2    08/16/24 Canalith repositioning for L side BPPV - increased dizziness with L side and reports the room was spinning, eased off after a few seconds -some dizziness with dumping as well      PATIENT EDUCATION: Education details: POC, Brandt-Daroff exercises, anatomy and cause of BPPV Person educated: Patient Education method: Explanation Education comprehension: verbalized understanding  HOME EXERCISE PROGRAM: Wilhelmena Carrel exercise program  Access Code: [email or web address] URL: [email or web address] Date: 08/18/2024 Prepared by: Almetta Fam  Exercises - Seated Gaze Stabilization with Head Rotation  - 1 x daily - 7 x weekly - 3 sets - 10 reps - Seated Gaze Stabilization with Head Nod  - 1 x daily - 7 x weekly - 3 sets - 10 reps - Seated VOR Cancellation  - 1 x daily - 7 x weekly - 3 sets - 10 reps   GOALS: Goals reviewed with patient?  Yes  SHORT TERM GOALS: Target date: 09/13/24  Patient will be independent with initial HEP. Baseline:  Goal status: INITIAL  2.  Patient will demonstrate tandem stance 30s or better on both sides Baseline: needs helps getting in position, only hold a few secs Goal status: INITIAL    LONG TERM GOALS: Target date: 10/11/24  Patient will be independent with advanced/ongoing HEP to improve outcomes and carryover.  Baseline:  Goal status: INITIAL  2.  Patient will decrease dizziness by 50%.  Baseline:  Goal status: INITIAL  3.  Patient will be able to sleep and lay on her L side without dizziness.  Baseline:  Goal status: INITIAL   4.  Patient will demonstrate SLS >10s  Baseline: 2s Goal status: INITIAL    ASSESSMENT:  CLINICAL IMPRESSION: Patient is a 73 y.o. female who was seen today for physical therapy treatment for syncope. 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 She returns with ongoing dizziness, which she reports was worse yesterday when she tried to do some exercises at home. We went ahead and did L Epley maneuver today. She had increased dizziness with this that went away after about 30s-88min. After 5 mins we did it again, the second time her symptoms improved. We went over Brandt-Daroff exercises to work on home again and to try to do them more often if dizziness persists. Tried some VOR exercises today, which she did well with, no c/o dizziness. Pt will benefit from PT to address her dizziness to be able to sleep and lay on her L side without increase in symptoms.   OBJECTIVE IMPAIRMENTS: decreased balance and dizziness.   ACTIVITY LIMITATIONS: stairs, bed mobility, and locomotion level  PARTICIPATION LIMITATIONS: cleaning, shopping, and community activity  REHAB POTENTIAL: Good  CLINICAL DECISION MAKING: Evolving/moderate complexity  EVALUATION COMPLEXITY: Moderate  PLAN:  PT FREQUENCY: 1-2x/week  PT DURATION: 8 weeks  PLANNED INTERVENTIONS: 97110-Therapeutic  exercises, 97530- Therapeutic activity, 97112- Neuromuscular re-education, 97535- Self Care, [id number]- Manual therapy, [phone]- Canalith repositioning, Patient/Family education, Balance training, Stair training, Vestibular training, Cryotherapy, and Moist heat  PLAN FOR NEXT SESSION: check orthostatics, see how exercises are going, retest L side canal again, balance   Almetta Fam, PT 08/18/2024, 1:11 PM

## 2024-08-18 ENCOUNTER — Ambulatory Visit

## 2024-08-18 DIAGNOSIS — R479 Unspecified speech disturbances: Secondary | ICD-10-CM | POA: Diagnosis not present

## 2024-08-18 DIAGNOSIS — R42 Dizziness and giddiness: Secondary | ICD-10-CM | POA: Diagnosis not present

## 2024-08-18 DIAGNOSIS — H8112 Benign paroxysmal vertigo, left ear: Secondary | ICD-10-CM

## 2024-08-18 DIAGNOSIS — R55 Syncope and collapse: Secondary | ICD-10-CM

## 2024-08-18 DIAGNOSIS — R482 Apraxia: Secondary | ICD-10-CM | POA: Diagnosis not present

## 2024-08-18 DIAGNOSIS — F985 Adult onset fluency disorder: Secondary | ICD-10-CM | POA: Diagnosis not present

## 2024-08-25 ENCOUNTER — Ambulatory Visit (INDEPENDENT_AMBULATORY_CARE_PROVIDER_SITE_OTHER): Admitting: Family Medicine

## 2024-08-25 ENCOUNTER — Encounter: Payer: Self-pay | Admitting: Family Medicine

## 2024-08-25 VITALS — BP 112/94 | HR 90 | Ht 60.0 in | Wt 141.8 lb

## 2024-08-25 DIAGNOSIS — F444 Conversion disorder with motor symptom or deficit: Secondary | ICD-10-CM | POA: Diagnosis not present

## 2024-08-25 DIAGNOSIS — H8110 Benign paroxysmal vertigo, unspecified ear: Secondary | ICD-10-CM | POA: Diagnosis not present

## 2024-08-25 DIAGNOSIS — N184 Chronic kidney disease, stage 4 (severe): Secondary | ICD-10-CM | POA: Diagnosis not present

## 2024-08-25 NOTE — Progress Notes (Signed)
  Date of Visit: 08/25/2024   SUBJECTIVE:   HPI:  Discussed the use of AI scribe software for clinical note transcription with the patient, who gave verbal consent to proceed.  History of Present Illness Jennifer Moss is a 73 year old female who presents with dizziness and speech difficulties following a recent fall.  Dizziness and vertigo - Severe dizziness with a spinning sensation, especially when turning in bed, following a recent fall with head impact - Inner ear crystal repositioning exercises have led to improvement in dizziness  Syncope - Sustained a fall with head impact, resulting in facial bruising - No recollection of the fall event - Imaging at the hospital ruled out stroke - Intermittent stuttering persists after the fall - Experienced an episode of inability to speak during the hospital visit, which was distressing - Speech therapy is ongoing, with additional speech exercises performed at home with assistance from her daughter    OBJECTIVE:   BP (!) 112/94   Pulse 90   Ht 5' (1.524 m)   Wt 141 lb 12.8 oz (64.3 kg)   SpO2 97%   BMI 27.69 kg/m  Gen: no acute distress, pleasant, cooperative, well appearing HEENT: normocephalic, atraumatic  Heart: regular rate and rhythm, no murmur Lungs: clear to auscultation bilaterally, normal work of breathing  Neuro: alert speech normal grossly nonfocal Ext: No appreciable lower extremity edema bilaterally   ASSESSMENT/PLAN:   Assessment & Plan CKD (chronic kidney disease) stage 4, GFR 15-29 ml/min (HCC) Update renal function today Benign paroxysmal positional vertigo, unspecified laterality BPPV confirmed with outpatient rehabilitation. Symptoms improved with vestibular exercises. - Continue vestibular exercises as instructed by outpatient rehabilitation. 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 Symptoms inconsistent, stress-related. No stroke on  imaging. Discussed nature of functional neurological symptoms at length with patient.  - Continue speech therapy as scheduled, overall improved.     FOLLOW UP: Follow up in 3 mos for above issues  [place] J. Donah, MD [facility]

## 2024-08-25 NOTE — Patient Instructions (Signed)
 It was great to see you again today.  Checking kidney function Keep seeing physical therapy and speech therapy  Follow up with me in 3 months, sooner if needed  Be well, Dr. Donah

## 2024-08-26 ENCOUNTER — Ambulatory Visit: Admitting: Speech Pathology

## 2024-08-26 ENCOUNTER — Encounter: Payer: Self-pay | Admitting: Speech Pathology

## 2024-08-26 DIAGNOSIS — R479 Unspecified speech disturbances: Secondary | ICD-10-CM | POA: Diagnosis not present

## 2024-08-26 DIAGNOSIS — R55 Syncope and collapse: Secondary | ICD-10-CM | POA: Diagnosis not present

## 2024-08-26 DIAGNOSIS — F985 Adult onset fluency disorder: Secondary | ICD-10-CM | POA: Diagnosis not present

## 2024-08-26 DIAGNOSIS — R42 Dizziness and giddiness: Secondary | ICD-10-CM | POA: Diagnosis not present

## 2024-08-26 DIAGNOSIS — H8112 Benign paroxysmal vertigo, left ear: Secondary | ICD-10-CM | POA: Diagnosis not present

## 2024-08-26 DIAGNOSIS — R482 Apraxia: Secondary | ICD-10-CM | POA: Diagnosis not present

## 2024-08-26 LAB — BASIC METABOLIC PANEL WITH GFR
BUN/Creatinine Ratio: 9 — ABNORMAL LOW (ref 12–28)
BUN: 22 mg/dL (ref 8–27)
CO2: 15 mmol/L — ABNORMAL LOW (ref 20–29)
Calcium: 10.1 mg/dL (ref 8.7–10.3)
Chloride: 104 mmol/L (ref 96–106)
Creatinine, Ser: 2.55 mg/dL — ABNORMAL HIGH (ref 0.57–1.00)
Glucose: 108 mg/dL — ABNORMAL HIGH (ref 70–99)
Potassium: 4.8 mmol/L (ref 3.5–5.2)
Sodium: 142 mmol/L (ref 134–144)
eGFR: 20 mL/min/1.73 — ABNORMAL LOW (ref >59)

## 2024-08-26 NOTE — Therapy (Signed)
 OUTPATIENT SPEECH LANGUAGE PATHOLOGY TREATMENT NOTE & DISCHARGE SUMMARY   Patient Name: Jennifer Moss MRN: [id number] DOB:May 25, 1951, 73 y.o., female Today's Date: 08/26/2024  PCP: [name], MD REFERRING PROVIDER: Rosalynn Camie CROME, MD  SPEECH THERAPY DISCHARGE SUMMARY  Visits from Start of Care: 2  Current functional level related to goals / functional outcomes: Pt reports feeling like she is 97% back to baseline.    Remaining deficits: Mild memory   Education / Equipment: Completed   Patient agrees to discharge. Patient goals were partially met. Patient is being discharged due to being pleased with the current functional level..     END OF SESSION:  End of Session - 08/26/24 0932     Visit Number 2    Number of Visits 8    Date for SLP Re-Evaluation 10/16/24    SLP Start Time 0930    SLP Stop Time  1010    SLP Time Calculation (min) 40 min    Activity Tolerance Patient tolerated treatment well          Past Medical History:  Diagnosis Date   Anginal pain (HCC)    Arthritis    Cataract    repaired.   Chronic health problem 07/26/2024   Chronic kidney disease    rt kidney horseshoe   Constipation 01/28/2013   Diverticulitis    Drug abuse (HCC)    Hyperlipemia    Hypertension    Injury of left shoulder 01/19/2013   Leg pain 01/11/2013   Mallory-Weiss tear    Muscle spasms of both lower extremities    Prolonged Q-T interval on ECG 01/11/2013   Rectal bleed    Retinal detachment    R eye, per patient   Routine adult health maintenance 11/19/2023   Shortness of breath    Past Surgical History:  Procedure Laterality Date   APPENDECTOMY     COLONOSCOPY N/A 04/11/2015   Procedure: COLONOSCOPY;  Surgeon: Margo CROME Haddock, MD;  Location: [facility];  Service: Endoscopy;  Laterality: N/A;  8:30 AM   COLONOSCOPY  2013   DIAGNOSTIC LAPAROSCOPY     for horseshoe kidney   ESOPHAGOGASTRODUODENOSCOPY  12/17/2012   Procedure:  ESOPHAGOGASTRODUODENOSCOPY (EGD);  Surgeon: Lesta JULIANNA Fitz, MD;  [facility] ENDOSCOPY;  Service: Endoscopy;  Laterality: N/A;   Lipoma removal     in 1990s had an egg-sized tumor removed from her leg, she thinks it was a lipoma   repair of detached retina     TUBAL LIGATION     UPPER GASTROINTESTINAL ENDOSCOPY     Patient Active Problem List   Diagnosis Date Noted   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   Dizziness 07/26/2024   Syncope 07/26/2024   Prediabetes 05/20/2024   COPD (chronic obstructive pulmonary disease) (HCC) 11/19/2023   Allergic rhinitis 04/30/2023   Vitamin D  deficiency 08/20/2021   Polyneuropathy 05/21/2021   Insomnia 10/04/2020   Overweight 10/28/2019   Headache 07/12/2018   Hirsutism 08/28/2017   Gingival enlargement 04/07/2016   BRBPR (bright red blood per rectum) 04/26/2014   Diverticulosis 04/26/2014   Hyperlipidemia 11/28/2013   Asthma 04/03/2013   Neurofibromatosis, 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   Mass of soft tissue 01/12/2013   Mallory-Weiss tear 12/23/2012   Rectal bleeding 12/23/2012   Hypertension 12/23/2012   Drug abuse (including cocaine) 12/23/2012   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   Pt accompanied by: self  PERTINENT HISTORY: Per EMR: 73 yo female who presented on 07/26/24 with dizziness and difficulty with speech. CT and MRI neg for acute abnormality. PMH: HTN, HLD, COPD, CKD   PAIN:  Are you having pain? No  FALLS: Has patient fallen in last 6 months?  Yes; fall  LIVING ENVIRONMENT: Lives with:  lives alone Lives in: House/apartment  PLOF:  Level of assistance: Independent with ADLs, Independent with IADLs Employment: Retired; Pharmacologist (Nursing Tech)   PATIENT GOALS:   OBJECTIVE:  Note: Objective measures were completed at Evaluation unless otherwise noted.  DIAGNOSTIC FINDINGS: Per EMR:  MR BRAIN WO CONTRAST  IMPRESSION: 1. No acute intracranial abnormality. 2. Advanced chronic signal changes in the cerebral white matter and pons, not significantly changed since 2022, and favored to be chronic small vessel disease related.     Electronically Signed   By: VEAR Hurst M.D.   On: 07/26/2024 07:52  COGNITION: Overall cognitive status: Impaired Areas of impairment:  Attention: Impaired: Selective, Alternating, Divided Memory: Impaired: Working Proofreader function: Impaired: Problem solving, Organization, Planning, Error awareness, and Self-correction Functional deficits: See below  AUDITORY COMPREHENSION: Overall auditory comprehension: Appears intact  READING COMPREHENSION: Not tested at this time  EXPRESSION: verbal  VERBAL EXPRESSION: Level of generative/spontaneous verbalization: conversation   WRITTEN EXPRESSION: Dominant hand: right Written expression: Not tested  MOTOR SPEECH: Overall motor speech: impaired Level of impairment: Sentence and Conversation Respiration: thoracic breathing Phonation: normal Resonance: WFL Articulation: Appears intact Intelligibility: Intelligible Motor planning: Impaired: aware, groping for words, and inconsistent Motor speech errors: aware, groping for words, and inconsistent Interfering components: NA Effective technique: slow rate and easy onset  ORAL MOTOR EXAMINATION: Overall status: WFL Comments: NA    STANDARDIZED ASSESSMENTS: SLUMS: 9/30   Informal speech assessment  Repetition: words of increasing length: 3/5 (word/sound errors) Sentences: Initiation sound  repetition, prolongations of sounds (mostly initial placement)  AMRs: No difficulty SMRs: No difficulty  Story Retell: No errors observed  Reading paragraph: Articulatory groping, sound prolongations, and initial sound repetition   PATIENT REPORTED OUTCOME MEASURES (PROM): Communication Participation Item Bank - General Short Form   - 27/30 (higher scores indicating less impact).                                                                                                                             TREATMENT DATE:   08/26/24: Pt was seen for skilled ST services targeting communication. She reports she may have disfluency occasionally, but she is talking way more plainly. Pt reports memory has also been good - she feels like she has  improved overall. Feeling close to back to her normal self - 97% back to baseline. SLP provided pt with discharge packet and briefly reviewed memory strategies. Pt feels confident in d/c today and knows she is able to return with referral if things don't continue to improve.   PATIENT EDUCATION: Education details: SLP role  Person educated: Patient Education method: Explanation Education comprehension: verbalized understanding and needs further education   GOALS: Goals reviewed with patient? No  SHORT TERM GOALS: Target date: 09/16/24  Pt will complete PROM  Baseline: Goal status: IMET  2.  Pt will recall strategies for neurogenic stuttering with minA Baseline:  Goal status: DEFERRED - not needed  3.  Pt will verbalize strategies to improve recall of important information.  Baseline:  Goal status: PARTIALLY MET - Educated    LONG TERM GOALS: Target date: 10/16/24  Pt will improve score on PROM Baseline:  Goal status: DEFERRED  2.  Pt will demonstrate use of strategies for neurogenic stuttering.  Baseline:  Goal status: DEFERRED  3.  Pt will report successful use of memory strategies at home/in community.  Baseline:  Goal status:  DEFERRED    ASSESSMENT:  CLINICAL IMPRESSION: Pt is a 73 yo female who presents to ST OP for evaluation post syncope episode. SEE TX NOTE. Pt to discharge today as she feels she is almost at baseline.     OBJECTIVE IMPAIRMENTS: include attention, memory, executive functioning, and neurogenic stuttering. These impairments are limiting patient from effectively communicating at home and in community. Factors affecting potential to achieve goals and functional outcome are NA. Patient will benefit from skilled SLP services to address above impairments and improve overall function.  REHAB POTENTIAL: Good  PLAN:  SLP FREQUENCY: 1x/week  SLP DURATION: 8 weeks  PLANNED INTERVENTIONS: Language facilitation, Environmental controls, Cueing hierachy, Internal/external aids, Functional tasks, Multimodal communication approach, SLP instruction and feedback, Compensatory strategies, Patient/family education, and [id number] Treatment of speech (30 or 45 min)     [employer], CCC-SLP 08/26/2024, 9:32 AM

## 2024-08-27 DIAGNOSIS — H8110 Benign paroxysmal vertigo, unspecified ear: Secondary | ICD-10-CM | POA: Insufficient documentation

## 2024-08-27 NOTE — Assessment & Plan Note (Signed)
 BPPV confirmed with outpatient rehabilitation. Symptoms improved with vestibular exercises. - Continue vestibular exercises as instructed by outpatient rehabilitation.

## 2024-08-27 NOTE — Assessment & Plan Note (Signed)
 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 Symptoms inconsistent, stress-related. No stroke on imaging. Discussed nature of functional neurological symptoms at length with patient.  - Continue speech therapy as scheduled, overall improved.

## 2024-08-27 NOTE — Assessment & Plan Note (Signed)
Update renal function today.

## 2024-08-31 ENCOUNTER — Encounter: Admitting: Speech Pathology

## 2024-09-06 NOTE — Therapy (Signed)
 OUTPATIENT PHYSICAL THERAPY NEURO TREATMENT   Patient Name: Jennifer Moss MRN: [id number] DOB:August 18, 1951, 73 y.o., female Today's Date: 09/06/2024   PCP: Laymon Legions REFERRING PROVIDER: Camie Mulch  END OF SESSION:     Past Medical History:  Diagnosis Date   Anginal pain (HCC)    Arthritis    Cataract    repaired.   Chronic health problem 07/26/2024   Chronic kidney disease    rt kidney horseshoe   Constipation 01/28/2013   Diverticulitis    Drug abuse (HCC)    Hyperlipemia    Hypertension    Injury of left shoulder 01/19/2013   Leg pain 01/11/2013   Mallory-Weiss tear    Muscle spasms of both lower extremities    Prolonged Q-T interval on ECG 01/11/2013   Rectal bleed    Retinal detachment    R eye, per patient   Routine adult health maintenance 11/19/2023   Shortness of breath    Past Surgical History:  Procedure Laterality Date   APPENDECTOMY     COLONOSCOPY N/A 04/11/2015   Procedure: COLONOSCOPY;  Surgeon: Margo LITTIE Haddock, MD;  Location: [facility];  Service: Endoscopy;  Laterality: N/A;  8:30 AM   COLONOSCOPY  2013   DIAGNOSTIC LAPAROSCOPY     for horseshoe kidney   ESOPHAGOGASTRODUODENOSCOPY  12/17/2012   Procedure: ESOPHAGOGASTRODUODENOSCOPY (EGD);  Surgeon: Lesta JULIANNA Fitz, MD;  Location: [facility] ENDOSCOPY;  Service: Endoscopy;  Laterality: N/A;   Lipoma removal     in 1990s had an egg-sized tumor removed from her leg, she thinks it was a lipoma   repair of detached retina     TUBAL LIGATION     UPPER GASTROINTESTINAL ENDOSCOPY     Patient Active Problem List   Diagnosis Date Noted   BPPV (benign paroxysmal positional vertigo) 08/27/2024   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   Dizziness 07/26/2024   Syncope 07/26/2024   Prediabetes 05/20/2024   COPD (chronic obstructive pulmonary disease) (HCC) 11/19/2023   Allergic rhinitis 04/30/2023   Vitamin D  deficiency 08/20/2021   Polyneuropathy 05/21/2021   Insomnia 10/04/2020   Overweight 10/28/2019    Headache 07/12/2018   Hirsutism 08/28/2017   Gingival enlargement 04/07/2016   BRBPR (bright red blood per rectum) 04/26/2014   Diverticulosis 04/26/2014   Hyperlipidemia 11/28/2013   Asthma 04/03/2013   Neurofibromatosis, 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  Rationale for Evaluation and Treatment: Rehabilitation  SUBJECTIVE:  SUBJECTIVE STATEMENT: When I left here Tuesday, I did my exercises and everything was good. Yesterday I got up early and tried to do the exercises, the R side was perfect. I went to do the L side, and when I got up the room start spinning and I flew back in the bed. I couldn't even move yesterday, I just stayed home in the bed. I was scared to walk down the stairs.   Pt accompanied by: self  PERTINENT HISTORY: Patient was admitted for observation after having multiple episodes of dizziness overnight on 7/28. She had a fall that she does not remember and hit her face. Patient was initially seen in the ED for stroke rule out.  CT and MRI negative.  EKG was normal.  Patient had some difficulty finding words and stuttering, however no other focal neurological deficits were noted.  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  Patient was observed overnight.  Echocardiogram showed LVEF 55-60%, Grade I diastolic dysfunction.  Patient continued  to do well without any further neurological deficits. Infectious work up was negative. Suspect symptoms likely related to stress from recent health changes.  SLP saw patient and recommended outpatient speech therapy, referral was placed at discharge.  Patient was at neurological baseline except for aphemia at discharge.    PAIN:  Are you having pain? No  PRECAUTIONS: Fall  RED FLAGS: None   WEIGHT BEARING RESTRICTIONS: No  FALLS: Has patient fallen in last 6 months? Yes. Number of falls 1  LIVING ENVIRONMENT: Lives with: lives alone Lives in: House/apartment Stairs: Yes: Internal: 13 steps; on right going up and External: 6 steps; can reach both   PLOF: Independent and Independent with gait  PATIENT GOALS: figure out what is going on   OBJECTIVE:  Note: Objective measures were completed at Evaluation unless otherwise noted.  DIAGNOSTIC FINDINGS: CT head: 1. No acute cortically based infarct or acute intracranial hemorrhage identified. ASPECTS 10. 2. Moderate for age cerebral white matter disease appears similar to 2022 MRI. 3. These results were communicated to Dr. Michaela at 7:01 am on 07/26/2024 by text page via the [facility] messaging system.   MRI Brain: 1. No acute intracranial abnormality. 2. Advanced chronic signal changes in the cerebral white matter and pons, not significantly changed since 2022, and favored to be chronic small vessel disease related.   MR Angio Head: 1. Negative for large vessel occlusion. Generalized arterial tortuosity, including ectatic Right ICA. No intracranial artery stenosis identified.   CT Maxillofacial:  1. Right periorbital soft tissue swelling.    CXR: No acute cardiopulmonary disease   COGNITION: Overall cognitive status: Within functional limits for tasks assessed   SENSATION: WFL  COORDINATION: Some decreased coordination and balance deficits  LOWER EXTREMITY ROM:   WFL   LOWER EXTREMITY MMT:  5/5   BED MOBILITY:   Findings: Supine to sit Complete Independence and but some dizziness, has to sit for a second before getting up Rolling to Left Modified independence and increased dizziness when turning to L   GAIT: Findings: Gait Characteristics: WFL, Distance walked: in clinic distances, and Comments: some veering and slight LOB but overall is pretty stable and safe  FUNCTIONAL TESTS:  BERG 48/56  TREATMENT DATE:  09/07/24 Recheck cancals VOR x1 and x2 Tandem stance SLS On airex with head turns  On airex eyes closed  Walking on beam Walking with head turns  08/18/24 Redo canalith repositioning for L side -increased symptoms and PUP with L side dumping -some dizziness when dumping down on floor Second trial for L Epley -sx presentation lasted only a few seconds  Reviewed Brandt-Daroff exercises  VOR x1 VOR x2    08/16/24 Canalith repositioning for L side BPPV - increased dizziness with L side and reports the room was spinning, eased off after a few seconds -some dizziness with dumping as well      PATIENT EDUCATION: Education details: POC, Brandt-Daroff exercises, anatomy and cause of BPPV Person educated: Patient Education method: Explanation Education comprehension: verbalized understanding  HOME EXERCISE PROGRAM: Wilhelmena Carrel exercise program  Access Code: [email or web address] URL: [email or web address] Date: 08/18/2024 Prepared by: Almetta Fam  Exercises - Seated Gaze Stabilization with Head Rotation  - 1 x daily - 7 x weekly - 3 sets - 10 reps - Seated Gaze Stabilization with Head Nod  - 1 x daily - 7 x weekly - 3 sets - 10 reps - Seated VOR Cancellation  - 1 x daily - 7 x weekly - 3 sets - 10 reps   GOALS: Goals reviewed with patient? Yes  SHORT TERM GOALS: Target date: 09/13/24  Patient will be independent with initial HEP. Baseline:   Goal status: INITIAL  2.  Patient will demonstrate tandem stance 30s or better on both sides Baseline: needs helps getting in position, only hold a few secs Goal status: INITIAL    LONG TERM GOALS: Target date: 10/11/24  Patient will be independent with advanced/ongoing HEP to improve outcomes and carryover.  Baseline:  Goal status: INITIAL  2.  Patient will decrease dizziness by 50%.  Baseline:  Goal status: INITIAL  3.  Patient will be able to sleep and lay on her L side without dizziness.  Baseline:  Goal status: INITIAL   4.  Patient will demonstrate SLS >10s  Baseline: 2s Goal status: INITIAL    ASSESSMENT:  CLINICAL IMPRESSION: Patient is a 73 y.o. female who was seen today for physical therapy treatment for syncope. 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 She returns with ongoing dizziness, which she reports was worse yesterday when she tried to do some exercises at home. We went ahead and did L Epley maneuver today. She had increased dizziness with this that went away after about 30s-73min. After 5 mins we did it again, the second time her symptoms improved. We went over Brandt-Daroff exercises to work on home again and to try to do them more often if dizziness persists. Tried some VOR exercises today, which she did well with, no c/o dizziness. Pt will benefit from PT to address her dizziness to be able to sleep and lay on her L side without increase in symptoms.   OBJECTIVE IMPAIRMENTS: decreased balance and dizziness.   ACTIVITY LIMITATIONS: stairs, bed mobility, and locomotion level  PARTICIPATION LIMITATIONS: cleaning, shopping, and community activity  REHAB POTENTIAL: Good  CLINICAL DECISION MAKING: Evolving/moderate complexity  EVALUATION COMPLEXITY: Moderate  PLAN:  PT FREQUENCY: 1-2x/week  PT DURATION: 8 weeks  PLANNED INTERVENTIONS: 97110-Therapeutic exercises, 97530- Therapeutic activity, 97112- Neuromuscular re-education, 97535- Self Care, [id number]- Manual  therapy, [phone]- Canalith repositioning, Patient/Family education, Balance training, Stair training, Vestibular training, Cryotherapy, and Moist heat  PLAN FOR NEXT SESSION: check orthostatics, see how exercises are going, retest L  side canal again, balance   Almetta Fam, PT 09/06/2024, 4:29 PM

## 2024-09-07 ENCOUNTER — Ambulatory Visit: Attending: Family Medicine

## 2024-09-07 ENCOUNTER — Ambulatory Visit: Admitting: Speech Pathology

## 2024-09-07 DIAGNOSIS — H8112 Benign paroxysmal vertigo, left ear: Secondary | ICD-10-CM | POA: Diagnosis not present

## 2024-09-07 DIAGNOSIS — R2689 Other abnormalities of gait and mobility: Secondary | ICD-10-CM | POA: Insufficient documentation

## 2024-09-07 DIAGNOSIS — R55 Syncope and collapse: Secondary | ICD-10-CM | POA: Diagnosis not present

## 2024-09-07 DIAGNOSIS — R42 Dizziness and giddiness: Secondary | ICD-10-CM | POA: Diagnosis not present

## 2024-09-12 ENCOUNTER — Ambulatory Visit: Payer: Self-pay | Admitting: Family Medicine

## 2024-09-13 NOTE — Therapy (Signed)
 OUTPATIENT PHYSICAL THERAPY NEURO TREATMENT   Patient Name: Jennifer Moss MRN: [id number] DOB:05/30/1951, 73 y.o., female Today's Date: 09/14/2024   PCP: Laymon Legions REFERRING PROVIDER: Camie Mulch  END OF SESSION:  PT End of Session - 09/14/24 0755     Visit Number 4    Date for PT Re-Evaluation 10/11/24    Authorization Type [employer]    PT Start Time 0755    PT Stop Time 0835    PT Time Calculation (min) 40 min             Past Medical History:  Diagnosis Date   Anginal pain (HCC)    Arthritis    Cataract    repaired.   Chronic health problem 07/26/2024   Chronic kidney disease    rt kidney horseshoe   Constipation 01/28/2013   Diverticulitis    Drug abuse (HCC)    Hyperlipemia    Hypertension    Injury of left shoulder 01/19/2013   Leg pain 01/11/2013   Mallory-Weiss tear    Muscle spasms of both lower extremities    Prolonged Q-T interval on ECG 01/11/2013   Rectal bleed    Retinal detachment    R eye, per patient   Routine adult health maintenance 11/19/2023   Shortness of breath    Past Surgical History:  Procedure Laterality Date   APPENDECTOMY     COLONOSCOPY N/A 04/11/2015   Procedure: COLONOSCOPY;  Surgeon: Margo LITTIE Haddock, MD;  Location: [facility];  Service: Endoscopy;  Laterality: N/A;  8:30 AM   COLONOSCOPY  2013   DIAGNOSTIC LAPAROSCOPY     for horseshoe kidney   ESOPHAGOGASTRODUODENOSCOPY  12/17/2012   Procedure: ESOPHAGOGASTRODUODENOSCOPY (EGD);  Surgeon: Lesta JULIANNA Fitz, MD;  Location: [facility] ENDOSCOPY;  Service: Endoscopy;  Laterality: N/A;   Lipoma removal     in 1990s had an egg-sized tumor removed from her leg, she thinks it was a lipoma   repair of detached retina     TUBAL LIGATION     UPPER GASTROINTESTINAL ENDOSCOPY     Patient Active Problem List   Diagnosis Date Noted   BPPV (benign paroxysmal positional vertigo) 08/27/2024   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   Dizziness 07/26/2024   Syncope 07/26/2024   Prediabetes  05/20/2024   COPD (chronic obstructive pulmonary disease) (HCC) 11/19/2023   Allergic rhinitis 04/30/2023   Vitamin D  deficiency 08/20/2021   Polyneuropathy 05/21/2021   Insomnia 10/04/2020   Overweight 10/28/2019   Headache 07/12/2018   Hirsutism 08/28/2017   Gingival enlargement 04/07/2016   BRBPR (bright red blood per rectum) 04/26/2014   Diverticulosis 04/26/2014   Hyperlipidemia 11/28/2013   Asthma 04/03/2013   Neurofibromatosis, 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   Mass of soft tissue 01/12/2013   Mallory-Weiss tear 12/23/2012   Rectal bleeding 12/23/2012   Hypertension 12/23/2012   Drug abuse (including cocaine) 12/23/2012   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 No dizziness. Still doing my exercises.   Pt accompanied by: self  PERTINENT HISTORY: Patient was admitted for observation after having multiple episodes of dizziness overnight on 7/28. She had a fall that she does not remember and hit her face. Patient was initially seen in the ED for stroke rule out.  CT and MRI negative.  EKG was normal.  Patient had some difficulty finding words and stuttering, however no other focal neurological deficits were noted.  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  Patient was observed overnight.  Echocardiogram showed LVEF 55-60%, Grade  I diastolic dysfunction.  Patient continued to do well without any further neurological deficits. Infectious work up was negative. Suspect symptoms likely related to stress from recent health changes.  SLP saw patient and recommended outpatient speech therapy, referral was placed at discharge.  Patient was at neurological baseline except for aphemia at discharge.    PAIN:  Are you having pain? No  PRECAUTIONS: Fall  RED FLAGS: None   WEIGHT BEARING RESTRICTIONS: No  FALLS: Has patient fallen in last 6 months? Yes. Number of falls 1  LIVING ENVIRONMENT: Lives with: lives alone Lives in: House/apartment Stairs: Yes: Internal: 13 steps; on right going up and External: 6 steps; can reach both   PLOF: Independent and Independent with gait  PATIENT GOALS: figure out what is going on   OBJECTIVE:  Note: Objective measures were completed at Evaluation unless otherwise noted.  DIAGNOSTIC FINDINGS: CT head: 1. No acute cortically based infarct or acute intracranial hemorrhage identified. ASPECTS 10. 2. Moderate for age cerebral white matter disease appears similar to 2022 MRI. 3. These results were communicated to Dr. Michaela at 7:01 am on 07/26/2024 by text page via the [facility] messaging system.   MRI Brain: 1. No acute intracranial abnormality. 2. Advanced chronic signal changes in the cerebral white matter and pons, not significantly changed since 2022, and favored to be chronic small vessel disease related.   MR Angio Head: 1. Negative for large vessel occlusion. Generalized arterial tortuosity, including ectatic Right ICA. No intracranial artery stenosis identified.   CT Maxillofacial:  1. Right periorbital soft tissue swelling.    CXR: No acute cardiopulmonary disease   COGNITION: Overall cognitive status: Within functional limits for tasks assessed   SENSATION: WFL  COORDINATION: Some decreased coordination and balance deficits  LOWER EXTREMITY ROM:    WFL   LOWER EXTREMITY MMT:  5/5   BED MOBILITY:  Findings: Supine to sit Complete Independence and but some dizziness, has to sit for a second before getting up Rolling to Left Modified independence and increased dizziness when turning to L   GAIT: Findings: Gait Characteristics: WFL, Distance walked: in clinic distances, and Comments: some veering and slight LOB but overall is pretty stable and safe  FUNCTIONAL TESTS:  BERG 48/56  TREATMENT DATE:  09/13/24 NuStep L5x46mins  Walking on beam On airex marching, on airex cone taps On airex tapping different color targets  Walking with ball toss all directions  SLS STS on airex 2x10 Walk outdoors 1 lap   09/07/24 NuStep L5x68mins  VOR x1 and x2 horizontal and vertical Tandem stance- 30s SLS- 5s at best On airex with head turns, then looking at target On airex eyes closed  Walking on beam Walking with head turns forwards and backwards On airex ball toss   08/18/24 Redo canalith repositioning for L side -increased symptoms and PUP with L side dumping -some dizziness when dumping down on floor Second trial for L Epley -sx presentation lasted only a few seconds  Reviewed Brandt-Daroff exercises  VOR x1 VOR x2    08/16/24 Canalith repositioning for L side BPPV - increased dizziness with L side and reports the room was spinning, eased off after a few seconds -some dizziness with dumping as well      PATIENT EDUCATION: Education details: POC, Brandt-Daroff exercises, anatomy and cause of BPPV Person educated: Patient Education method: Explanation Education comprehension: verbalized understanding  HOME EXERCISE PROGRAM: Wilhelmena Carrel exercise program  Access Code: [email or web address] URL: [email or web address] Date: 08/18/2024 Prepared by: Almetta Fam  Exercises - Seated Gaze  Stabilization with Head Rotation  - 1 x daily - 7 x weekly - 3 sets - 10 reps - Seated Gaze Stabilization with Head Nod  - 1 x daily - 7 x weekly - 3 sets - 10 reps - Seated VOR Cancellation  - 1 x daily - 7 x weekly - 3 sets - 10 reps   GOALS: Goals reviewed with patient? Yes  SHORT TERM GOALS: Target date: 09/13/24  Patient will be independent with initial HEP. Baseline:  Goal status: MET  2.  Patient will demonstrate tandem stance 30s or better on both sides Baseline: needs helps getting in position, only hold a few secs Goal status: MET 09/07/24    LONG TERM GOALS: Target date: 10/11/24  Patient will be independent with advanced/ongoing HEP to improve outcomes and carryover.  Baseline:  Goal status: MET 09/14/24  2.  Patient will decrease dizziness by 50%.  Baseline:  Goal status: MET 09/07/24  3.  Patient will be able to sleep and lay on her L side without dizziness.  Baseline:  Goal status: MET 09/07/24   4.  Patient will demonstrate SLS >10s  Baseline: 2s Goal status: 5s progressing 09/07/24, 10s both sides MET 09/14/24    ASSESSMENT:  CLINICAL IMPRESSION: Patient returns continuing to feel good about her dizziness. She is able to sleep on her L side now and turn in bed without triggering her vertigo. Continued with VOR and balance exercises today. Because she has met her goals we agreed on discharge. Was advised to call back if she has questions or anything changes.   OBJECTIVE IMPAIRMENTS: decreased balance and dizziness.   ACTIVITY LIMITATIONS: stairs, bed mobility, and locomotion level  PARTICIPATION LIMITATIONS: cleaning, shopping, and community activity  REHAB POTENTIAL: Good  CLINICAL DECISION MAKING: Evolving/moderate complexity  EVALUATION COMPLEXITY: Moderate  PLAN:  PT FREQUENCY: 1-2x/week  PT DURATION: 8 weeks  PLANNED INTERVENTIONS: 97110-Therapeutic exercises, 97530- Therapeutic activity, 97112- Neuromuscular re-education, 97535- Self Care,  [id number]- Manual therapy, [phone]- Canalith repositioning, Patient/Family education, Balance training, Stair training, Vestibular training, Cryotherapy, and Moist heat  PLAN FOR NEXT SESSION: d/c  PHYSICAL THERAPY DISCHARGE SUMMARY  Visits from Start of Care: 4  Patient agrees to discharge. Patient goals  were met. Patient is being discharged due to being pleased with the current functional level.   [place], PT 09/14/2024, 8:34 AM

## 2024-09-14 ENCOUNTER — Ambulatory Visit

## 2024-09-14 ENCOUNTER — Ambulatory Visit: Admitting: Speech Pathology

## 2024-09-14 DIAGNOSIS — R42 Dizziness and giddiness: Secondary | ICD-10-CM | POA: Diagnosis not present

## 2024-09-14 DIAGNOSIS — R2689 Other abnormalities of gait and mobility: Secondary | ICD-10-CM | POA: Diagnosis not present

## 2024-09-14 DIAGNOSIS — H8112 Benign paroxysmal vertigo, left ear: Secondary | ICD-10-CM | POA: Diagnosis not present

## 2024-09-14 DIAGNOSIS — R55 Syncope and collapse: Secondary | ICD-10-CM | POA: Diagnosis not present

## 2024-09-20 ENCOUNTER — Other Ambulatory Visit: Payer: Self-pay | Admitting: Family Medicine

## 2024-10-05 DIAGNOSIS — I129 Hypertensive chronic kidney disease with stage 1 through stage 4 chronic kidney disease, or unspecified chronic kidney disease: Secondary | ICD-10-CM | POA: Diagnosis not present

## 2024-10-05 DIAGNOSIS — N189 Chronic kidney disease, unspecified: Secondary | ICD-10-CM | POA: Diagnosis not present

## 2024-10-05 DIAGNOSIS — D631 Anemia in chronic kidney disease: Secondary | ICD-10-CM | POA: Diagnosis not present

## 2024-10-05 DIAGNOSIS — I10 Essential (primary) hypertension: Secondary | ICD-10-CM | POA: Diagnosis not present

## 2024-10-05 DIAGNOSIS — N184 Chronic kidney disease, stage 4 (severe): Secondary | ICD-10-CM | POA: Diagnosis not present

## 2024-10-05 DIAGNOSIS — N2581 Secondary hyperparathyroidism of renal origin: Secondary | ICD-10-CM | POA: Diagnosis not present

## 2024-10-05 LAB — LAB REPORT - SCANNED
Albumin, Urine POC: 51.6
Albumin/Creatinine Ratio, Urine, POC: 80
Creatinine, POC: 64.1 mg/dL
PTH, Intact: 84

## 2024-11-02 ENCOUNTER — Other Ambulatory Visit: Payer: Self-pay | Admitting: Family Medicine

## 2025-01-31 ENCOUNTER — Other Ambulatory Visit: Payer: Self-pay | Admitting: Family Medicine

## 2025-06-01 ENCOUNTER — Encounter
